# Patient Record
Sex: Female | Born: 1940 | Race: White | Hispanic: No | Marital: Married | Smoking: Never smoker
Health system: Southern US, Community
[De-identification: ages and names within clinical notes are randomized; demographics above are authoritative.]

## PROBLEM LIST (undated history)

## (undated) DIAGNOSIS — R0602 Shortness of breath: Secondary | ICD-10-CM

## (undated) DIAGNOSIS — B059 Measles without complication: Secondary | ICD-10-CM

## (undated) DIAGNOSIS — I10 Essential (primary) hypertension: Principal | ICD-10-CM

## (undated) DIAGNOSIS — E119 Type 2 diabetes mellitus without complications: Secondary | ICD-10-CM

## (undated) DIAGNOSIS — Z8619 Personal history of other infectious and parasitic diseases: Secondary | ICD-10-CM

## (undated) DIAGNOSIS — G4730 Sleep apnea, unspecified: Secondary | ICD-10-CM

## (undated) DIAGNOSIS — E039 Hypothyroidism, unspecified: Secondary | ICD-10-CM

## (undated) DIAGNOSIS — L659 Nonscarring hair loss, unspecified: Secondary | ICD-10-CM

## (undated) DIAGNOSIS — R51 Headache: Secondary | ICD-10-CM

## (undated) DIAGNOSIS — T8859XA Other complications of anesthesia, initial encounter: Secondary | ICD-10-CM

## (undated) DIAGNOSIS — C801 Malignant (primary) neoplasm, unspecified: Secondary | ICD-10-CM

## (undated) DIAGNOSIS — M1990 Unspecified osteoarthritis, unspecified site: Secondary | ICD-10-CM

## (undated) DIAGNOSIS — T4145XA Adverse effect of unspecified anesthetic, initial encounter: Secondary | ICD-10-CM

## (undated) DIAGNOSIS — R0601 Orthopnea: Secondary | ICD-10-CM

## (undated) DIAGNOSIS — I2699 Other pulmonary embolism without acute cor pulmonale: Secondary | ICD-10-CM

## (undated) HISTORY — DX: Unspecified osteoarthritis, unspecified site: M19.90

## (undated) HISTORY — DX: Essential (primary) hypertension: I10

## (undated) HISTORY — DX: Type 2 diabetes mellitus without complications: E11.9

## (undated) HISTORY — DX: Hypothyroidism, unspecified: E03.9

## (undated) HISTORY — DX: Malignant (primary) neoplasm, unspecified: C80.1

## (undated) HISTORY — DX: Nonscarring hair loss, unspecified: L65.9

---

## 2000-09-06 HISTORY — PX: THYROIDECTOMY, PARTIAL: SHX18

## 2000-10-12 ENCOUNTER — Ambulatory Visit (HOSPITAL_COMMUNITY): Admission: RE | Admit: 2000-10-12 | Discharge: 2000-10-12 | Payer: Self-pay | Admitting: Orthopedic Surgery

## 2000-10-20 ENCOUNTER — Encounter: Payer: Self-pay | Admitting: Orthopedic Surgery

## 2000-10-20 ENCOUNTER — Ambulatory Visit: Admission: RE | Admit: 2000-10-20 | Discharge: 2000-10-20 | Payer: Self-pay | Admitting: Orthopedic Surgery

## 2001-02-21 ENCOUNTER — Encounter: Admission: RE | Admit: 2001-02-21 | Discharge: 2001-02-21 | Payer: Self-pay | Admitting: Family Medicine

## 2001-02-21 ENCOUNTER — Encounter: Payer: Self-pay | Admitting: Family Medicine

## 2001-03-07 ENCOUNTER — Ambulatory Visit (HOSPITAL_COMMUNITY): Admission: RE | Admit: 2001-03-07 | Discharge: 2001-03-07 | Payer: Self-pay | Admitting: Family Medicine

## 2001-03-07 ENCOUNTER — Encounter (INDEPENDENT_AMBULATORY_CARE_PROVIDER_SITE_OTHER): Payer: Self-pay

## 2001-03-07 ENCOUNTER — Encounter: Payer: Self-pay | Admitting: Family Medicine

## 2001-05-24 ENCOUNTER — Encounter: Payer: Self-pay | Admitting: Anesthesiology

## 2001-05-24 ENCOUNTER — Inpatient Hospital Stay (HOSPITAL_COMMUNITY): Admission: RE | Admit: 2001-05-24 | Discharge: 2001-05-26 | Payer: Self-pay | Admitting: General Surgery

## 2001-05-24 ENCOUNTER — Encounter (INDEPENDENT_AMBULATORY_CARE_PROVIDER_SITE_OTHER): Payer: Self-pay

## 2001-05-25 ENCOUNTER — Encounter: Payer: Self-pay | Admitting: General Surgery

## 2001-05-26 ENCOUNTER — Encounter: Payer: Self-pay | Admitting: General Surgery

## 2001-06-21 ENCOUNTER — Encounter: Payer: Self-pay | Admitting: Obstetrics and Gynecology

## 2001-06-21 ENCOUNTER — Ambulatory Visit (HOSPITAL_COMMUNITY): Admission: RE | Admit: 2001-06-21 | Discharge: 2001-06-21 | Payer: Self-pay | Admitting: Obstetrics and Gynecology

## 2001-07-12 ENCOUNTER — Ambulatory Visit: Admission: RE | Admit: 2001-07-12 | Discharge: 2001-07-12 | Payer: Self-pay | Admitting: Gynecology

## 2001-07-12 ENCOUNTER — Encounter (INDEPENDENT_AMBULATORY_CARE_PROVIDER_SITE_OTHER): Payer: Self-pay | Admitting: Specialist

## 2001-07-12 ENCOUNTER — Other Ambulatory Visit: Admission: RE | Admit: 2001-07-12 | Discharge: 2001-07-12 | Payer: Self-pay | Admitting: Gynecology

## 2001-09-29 ENCOUNTER — Encounter: Payer: Self-pay | Admitting: Family Medicine

## 2001-09-30 ENCOUNTER — Inpatient Hospital Stay (HOSPITAL_COMMUNITY): Admission: EM | Admit: 2001-09-30 | Discharge: 2001-10-05 | Payer: Self-pay | Admitting: Internal Medicine

## 2001-10-02 ENCOUNTER — Encounter: Payer: Self-pay | Admitting: Internal Medicine

## 2002-03-28 ENCOUNTER — Encounter: Admission: RE | Admit: 2002-03-28 | Discharge: 2002-06-04 | Payer: Self-pay | Admitting: Family Medicine

## 2005-09-06 HISTORY — PX: BREAST SURGERY: SHX581

## 2006-05-05 ENCOUNTER — Encounter (INDEPENDENT_AMBULATORY_CARE_PROVIDER_SITE_OTHER): Payer: Self-pay | Admitting: Radiology

## 2006-05-05 ENCOUNTER — Encounter (INDEPENDENT_AMBULATORY_CARE_PROVIDER_SITE_OTHER): Payer: Self-pay | Admitting: *Deleted

## 2006-05-05 ENCOUNTER — Encounter: Admission: RE | Admit: 2006-05-05 | Discharge: 2006-05-05 | Payer: Self-pay | Admitting: Obstetrics and Gynecology

## 2006-05-18 ENCOUNTER — Encounter: Admission: RE | Admit: 2006-05-18 | Discharge: 2006-05-18 | Payer: Self-pay | Admitting: General Surgery

## 2006-05-19 ENCOUNTER — Encounter: Admission: RE | Admit: 2006-05-19 | Discharge: 2006-05-19 | Payer: Self-pay | Admitting: General Surgery

## 2006-05-24 ENCOUNTER — Encounter (INDEPENDENT_AMBULATORY_CARE_PROVIDER_SITE_OTHER): Payer: Self-pay | Admitting: Specialist

## 2006-05-24 ENCOUNTER — Encounter: Admission: RE | Admit: 2006-05-24 | Discharge: 2006-05-24 | Payer: Self-pay | Admitting: General Surgery

## 2006-05-24 ENCOUNTER — Observation Stay (HOSPITAL_COMMUNITY): Admission: AD | Admit: 2006-05-24 | Discharge: 2006-05-25 | Payer: Self-pay | Admitting: General Surgery

## 2006-06-07 ENCOUNTER — Ambulatory Visit: Payer: Self-pay | Admitting: Oncology

## 2006-07-01 ENCOUNTER — Ambulatory Visit: Admission: RE | Admit: 2006-07-01 | Discharge: 2006-09-29 | Payer: Self-pay | Admitting: Radiation Oncology

## 2006-08-09 ENCOUNTER — Encounter: Admission: RE | Admit: 2006-08-09 | Discharge: 2006-08-09 | Payer: Self-pay | Admitting: Oncology

## 2006-08-11 ENCOUNTER — Ambulatory Visit: Payer: Self-pay | Admitting: Oncology

## 2006-10-13 ENCOUNTER — Ambulatory Visit: Payer: Self-pay | Admitting: Oncology

## 2006-10-18 LAB — COMPREHENSIVE METABOLIC PANEL
ALT: 15 U/L (ref 0–35)
Albumin: 3.9 g/dL (ref 3.5–5.2)
BUN: 18 mg/dL (ref 6–23)
CO2: 26 mEq/L (ref 19–32)
Calcium: 9.4 mg/dL (ref 8.4–10.5)
Chloride: 103 mEq/L (ref 96–112)
Creatinine, Ser: 0.8 mg/dL (ref 0.40–1.20)
Potassium: 3.8 mEq/L (ref 3.5–5.3)

## 2006-10-18 LAB — CBC WITH DIFFERENTIAL/PLATELET
BASO%: 0.8 % (ref 0.0–2.0)
Basophils Absolute: 0 10*3/uL (ref 0.0–0.1)
HCT: 37.2 % (ref 34.8–46.6)
HGB: 13.1 g/dL (ref 11.6–15.9)
MONO#: 0.2 10*3/uL (ref 0.1–0.9)
NEUT%: 67.2 % (ref 39.6–76.8)
RDW: 14.8 % — ABNORMAL HIGH (ref 11.3–14.5)
WBC: 3.7 10*3/uL — ABNORMAL LOW (ref 3.9–10.0)
lymph#: 0.9 10*3/uL (ref 0.9–3.3)

## 2006-10-18 LAB — CANCER ANTIGEN 27.29: CA 27.29: 41 U/mL — ABNORMAL HIGH (ref 0–39)

## 2006-10-31 LAB — ESTRADIOL, ULTRA SENS: Estradiol, Ultra Sensitive: <2 pg/mL

## 2007-01-20 ENCOUNTER — Ambulatory Visit: Payer: Self-pay | Admitting: Oncology

## 2007-01-24 LAB — CBC WITH DIFFERENTIAL/PLATELET
BASO%: 0.4 % (ref 0.0–2.0)
Eosinophils Absolute: 0.1 10*3/uL (ref 0.0–0.5)
MCHC: 35.5 g/dL (ref 32.0–36.0)
MONO#: 0.3 10*3/uL (ref 0.1–0.9)
NEUT#: 2.3 10*3/uL (ref 1.5–6.5)
RBC: 4.08 10*6/uL (ref 3.70–5.32)
WBC: 3.7 10*3/uL — ABNORMAL LOW (ref 3.9–10.0)
lymph#: 1 10*3/uL (ref 0.9–3.3)

## 2007-01-24 LAB — COMPREHENSIVE METABOLIC PANEL
ALT: 22 U/L (ref 0–35)
Albumin: 3.4 g/dL — ABNORMAL LOW (ref 3.5–5.2)
CO2: 25 mEq/L (ref 19–32)
Calcium: 9.4 mg/dL (ref 8.4–10.5)
Chloride: 106 mEq/L (ref 96–112)
Glucose, Bld: 154 mg/dL — ABNORMAL HIGH (ref 70–99)
Potassium: 3.9 mEq/L (ref 3.5–5.3)
Sodium: 140 mEq/L (ref 135–145)
Total Bilirubin: 0.7 mg/dL (ref 0.3–1.2)
Total Protein: 7.1 g/dL (ref 6.0–8.3)

## 2007-04-24 ENCOUNTER — Encounter: Admission: RE | Admit: 2007-04-24 | Discharge: 2007-04-24 | Payer: Self-pay | Admitting: Oncology

## 2007-05-02 ENCOUNTER — Ambulatory Visit: Payer: Self-pay | Admitting: Oncology

## 2007-05-04 LAB — CBC WITH DIFFERENTIAL/PLATELET
BASO%: 0.5 % (ref 0.0–2.0)
LYMPH%: 33.7 % (ref 14.0–48.0)
MCHC: 34.7 g/dL (ref 32.0–36.0)
MONO#: 0.5 10*3/uL (ref 0.1–0.9)
NEUT#: 2.1 10*3/uL (ref 1.5–6.5)
Platelets: 167 10*3/uL (ref 145–400)
RBC: 4.25 10*6/uL (ref 3.70–5.32)
RDW: 14 % (ref 11.3–14.5)
WBC: 4.1 10*3/uL (ref 3.9–10.0)

## 2007-05-04 LAB — COMPREHENSIVE METABOLIC PANEL
ALT: 16 U/L (ref 0–35)
Albumin: 3.9 g/dL (ref 3.5–5.2)
Alkaline Phosphatase: 72 U/L (ref 39–117)
CO2: 22 mEq/L (ref 19–32)
Potassium: 4.1 mEq/L (ref 3.5–5.3)
Sodium: 139 mEq/L (ref 135–145)
Total Bilirubin: 0.5 mg/dL (ref 0.3–1.2)
Total Protein: 6.9 g/dL (ref 6.0–8.3)

## 2007-07-14 ENCOUNTER — Ambulatory Visit: Payer: Self-pay | Admitting: Oncology

## 2007-07-18 LAB — CBC WITH DIFFERENTIAL/PLATELET
BASO%: 0.7 % (ref 0.0–2.0)
Basophils Absolute: 0 10*3/uL (ref 0.0–0.1)
HCT: 37.5 % (ref 34.8–46.6)
HGB: 13 g/dL (ref 11.6–15.9)
LYMPH%: 30.7 % (ref 14.0–48.0)
MCH: 31.8 pg (ref 26.0–34.0)
MCHC: 34.7 g/dL (ref 32.0–36.0)
MONO#: 0.3 10*3/uL (ref 0.1–0.9)
NEUT%: 56.2 % (ref 39.6–76.8)
Platelets: 190 10*3/uL (ref 145–400)
WBC: 3.6 10*3/uL — ABNORMAL LOW (ref 3.9–10.0)

## 2007-07-18 LAB — COMPREHENSIVE METABOLIC PANEL
ALT: 18 U/L (ref 0–35)
BUN: 20 mg/dL (ref 6–23)
CO2: 25 mEq/L (ref 19–32)
Calcium: 9.5 mg/dL (ref 8.4–10.5)
Creatinine, Ser: 0.8 mg/dL (ref 0.40–1.20)
Glucose, Bld: 81 mg/dL (ref 70–99)
Total Bilirubin: 0.6 mg/dL (ref 0.3–1.2)

## 2007-07-18 LAB — CANCER ANTIGEN 27.29: CA 27.29: 39 U/mL (ref 0–39)

## 2007-09-07 HISTORY — PX: KNEE SURGERY: SHX244

## 2007-11-30 ENCOUNTER — Inpatient Hospital Stay (HOSPITAL_BASED_OUTPATIENT_CLINIC_OR_DEPARTMENT_OTHER): Admission: RE | Admit: 2007-11-30 | Discharge: 2007-11-30 | Payer: Self-pay | Admitting: *Deleted

## 2008-01-18 ENCOUNTER — Ambulatory Visit: Payer: Self-pay | Admitting: Oncology

## 2008-01-19 LAB — CBC WITH DIFFERENTIAL/PLATELET
Basophils Absolute: 0 10*3/uL (ref 0.0–0.1)
Eosinophils Absolute: 0.1 10*3/uL (ref 0.0–0.5)
HCT: 38.4 % (ref 34.8–46.6)
LYMPH%: 28.3 % (ref 14.0–48.0)
MCHC: 34.3 g/dL (ref 32.0–36.0)
MONO#: 0.3 10*3/uL (ref 0.1–0.9)
NEUT#: 2.4 10*3/uL (ref 1.5–6.5)
NEUT%: 59.9 % (ref 39.6–76.8)
Platelets: 215 10*3/uL (ref 145–400)
WBC: 4 10*3/uL (ref 3.9–10.0)

## 2008-01-19 LAB — CANCER ANTIGEN 27.29: CA 27.29: 38 U/mL (ref 0–39)

## 2008-01-19 LAB — COMPREHENSIVE METABOLIC PANEL
BUN: 21 mg/dL (ref 6–23)
CO2: 25 mEq/L (ref 19–32)
Creatinine, Ser: 0.74 mg/dL (ref 0.40–1.20)
Glucose, Bld: 92 mg/dL (ref 70–99)
Total Bilirubin: 0.5 mg/dL (ref 0.3–1.2)

## 2008-06-17 ENCOUNTER — Inpatient Hospital Stay (HOSPITAL_COMMUNITY): Admission: RE | Admit: 2008-06-17 | Discharge: 2008-06-21 | Payer: Self-pay | Admitting: Orthopedic Surgery

## 2008-07-22 ENCOUNTER — Ambulatory Visit: Payer: Self-pay | Admitting: Oncology

## 2008-07-24 LAB — CBC WITH DIFFERENTIAL/PLATELET
Basophils Absolute: 0 10*3/uL (ref 0.0–0.1)
Eosinophils Absolute: 0.1 10*3/uL (ref 0.0–0.5)
HGB: 10.7 g/dL — ABNORMAL LOW (ref 11.6–15.9)
LYMPH%: 21.2 % (ref 14.0–48.0)
MCV: 92.9 fL (ref 81.0–101.0)
MONO#: 0.4 10*3/uL (ref 0.1–0.9)
MONO%: 10.8 % (ref 0.0–13.0)
NEUT#: 2.4 10*3/uL (ref 1.5–6.5)
Platelets: 255 10*3/uL (ref 145–400)
WBC: 3.7 10*3/uL — ABNORMAL LOW (ref 3.9–10.0)

## 2008-07-24 LAB — COMPREHENSIVE METABOLIC PANEL
Albumin: 3.1 g/dL — ABNORMAL LOW (ref 3.5–5.2)
Alkaline Phosphatase: 103 U/L (ref 39–117)
BUN: 11 mg/dL (ref 6–23)
CO2: 28 mEq/L (ref 19–32)
Glucose, Bld: 97 mg/dL (ref 70–99)
Potassium: 3.3 mEq/L — ABNORMAL LOW (ref 3.5–5.3)
Sodium: 141 mEq/L (ref 135–145)
Total Bilirubin: 0.7 mg/dL (ref 0.3–1.2)
Total Protein: 6.7 g/dL (ref 6.0–8.3)

## 2008-07-25 LAB — CANCER ANTIGEN 27.29: CA 27.29: 48 U/mL — ABNORMAL HIGH (ref 0–39)

## 2008-08-07 ENCOUNTER — Encounter (INDEPENDENT_AMBULATORY_CARE_PROVIDER_SITE_OTHER): Payer: Self-pay | Admitting: Emergency Medicine

## 2008-08-07 ENCOUNTER — Emergency Department (HOSPITAL_COMMUNITY): Admission: EM | Admit: 2008-08-07 | Discharge: 2008-08-07 | Payer: Self-pay | Admitting: Emergency Medicine

## 2008-08-07 ENCOUNTER — Ambulatory Visit: Payer: Self-pay | Admitting: Surgery

## 2008-09-17 ENCOUNTER — Encounter: Admission: RE | Admit: 2008-09-17 | Discharge: 2008-09-17 | Payer: Self-pay | Admitting: Oncology

## 2008-12-11 ENCOUNTER — Ambulatory Visit: Payer: Self-pay | Admitting: Family Medicine

## 2008-12-11 DIAGNOSIS — M1990 Unspecified osteoarthritis, unspecified site: Secondary | ICD-10-CM

## 2008-12-11 DIAGNOSIS — L659 Nonscarring hair loss, unspecified: Secondary | ICD-10-CM

## 2008-12-11 DIAGNOSIS — I10 Essential (primary) hypertension: Secondary | ICD-10-CM | POA: Insufficient documentation

## 2008-12-11 DIAGNOSIS — E039 Hypothyroidism, unspecified: Secondary | ICD-10-CM | POA: Insufficient documentation

## 2008-12-11 DIAGNOSIS — E119 Type 2 diabetes mellitus without complications: Secondary | ICD-10-CM

## 2008-12-11 HISTORY — DX: Nonscarring hair loss, unspecified: L65.9

## 2008-12-11 HISTORY — DX: Type 2 diabetes mellitus without complications: E11.9

## 2008-12-11 HISTORY — DX: Unspecified osteoarthritis, unspecified site: M19.90

## 2008-12-11 HISTORY — DX: Essential (primary) hypertension: I10

## 2008-12-11 HISTORY — DX: Hypothyroidism, unspecified: E03.9

## 2008-12-13 LAB — CONVERTED CEMR LAB
Calcium: 9.6 mg/dL (ref 8.4–10.5)
GFR calc non Af Amer: 75.64 mL/min (ref >60)
Hgb A1c MFr Bld: 6 % (ref 4.6–6.5)
Sodium: 142 meq/L (ref 135–145)

## 2009-01-01 ENCOUNTER — Ambulatory Visit: Payer: Self-pay | Admitting: Family Medicine

## 2009-01-10 ENCOUNTER — Ambulatory Visit: Payer: Self-pay | Admitting: Oncology

## 2009-01-14 LAB — CBC WITH DIFFERENTIAL/PLATELET
BASO%: 0.6 % (ref 0.0–2.0)
Basophils Absolute: 0 10*3/uL (ref 0.0–0.1)
EOS%: 2.8 % (ref 0.0–7.0)
HGB: 12.7 g/dL (ref 11.6–15.9)
MCH: 31.9 pg (ref 25.1–34.0)
MCV: 92.6 fL (ref 79.5–101.0)
MONO%: 6.2 % (ref 0.0–14.0)
RBC: 3.97 10*6/uL (ref 3.70–5.45)
RDW: 15.6 % — ABNORMAL HIGH (ref 11.2–14.5)
lymph#: 1.4 10*3/uL (ref 0.9–3.3)

## 2009-01-14 LAB — COMPREHENSIVE METABOLIC PANEL
AST: 27 U/L (ref 0–37)
Alkaline Phosphatase: 80 U/L (ref 39–117)
BUN: 24 mg/dL — ABNORMAL HIGH (ref 6–23)
Creatinine, Ser: 0.84 mg/dL (ref 0.40–1.20)
Glucose, Bld: 145 mg/dL — ABNORMAL HIGH (ref 70–99)
Total Bilirubin: 0.4 mg/dL (ref 0.3–1.2)

## 2009-03-03 ENCOUNTER — Ambulatory Visit: Payer: Self-pay | Admitting: Family Medicine

## 2009-04-02 ENCOUNTER — Ambulatory Visit: Payer: Self-pay | Admitting: Family Medicine

## 2009-04-09 ENCOUNTER — Telehealth (INDEPENDENT_AMBULATORY_CARE_PROVIDER_SITE_OTHER): Payer: Self-pay | Admitting: *Deleted

## 2009-05-22 ENCOUNTER — Telehealth: Payer: Self-pay | Admitting: Family Medicine

## 2009-07-04 ENCOUNTER — Ambulatory Visit: Payer: Self-pay | Admitting: Family Medicine

## 2009-07-08 LAB — CONVERTED CEMR LAB: Hgb A1c MFr Bld: 6 % (ref 4.6–6.5)

## 2009-07-15 ENCOUNTER — Ambulatory Visit: Payer: Self-pay | Admitting: Oncology

## 2009-07-17 LAB — CBC WITH DIFFERENTIAL/PLATELET
Basophils Absolute: 0 10*3/uL (ref 0.0–0.1)
EOS%: 4.5 % (ref 0.0–7.0)
Eosinophils Absolute: 0.2 10*3/uL (ref 0.0–0.5)
HGB: 12.1 g/dL (ref 11.6–15.9)
NEUT#: 1.9 10*3/uL (ref 1.5–6.5)
RBC: 3.73 10*6/uL (ref 3.70–5.45)
RDW: 14.3 % (ref 11.2–14.5)
lymph#: 1.2 10*3/uL (ref 0.9–3.3)

## 2009-07-17 LAB — COMPREHENSIVE METABOLIC PANEL
AST: 21 U/L (ref 0–37)
Albumin: 3.8 g/dL (ref 3.5–5.2)
Alkaline Phosphatase: 78 U/L (ref 39–117)
BUN: 23 mg/dL (ref 6–23)
Calcium: 9.2 mg/dL (ref 8.4–10.5)
Chloride: 106 mEq/L (ref 96–112)
Glucose, Bld: 94 mg/dL (ref 70–99)
Potassium: 4.2 mEq/L (ref 3.5–5.3)
Sodium: 141 mEq/L (ref 135–145)
Total Protein: 6.8 g/dL (ref 6.0–8.3)

## 2009-07-24 ENCOUNTER — Encounter: Payer: Self-pay | Admitting: Family Medicine

## 2009-08-05 ENCOUNTER — Ambulatory Visit: Payer: Self-pay | Admitting: Family Medicine

## 2009-11-05 ENCOUNTER — Ambulatory Visit: Payer: Self-pay | Admitting: Family Medicine

## 2009-11-05 LAB — CONVERTED CEMR LAB
Bilirubin Urine: NEGATIVE
Creatinine,U: 210.8 mg/dL
Glucose, Urine, Semiquant: NEGATIVE
Microalb Creat Ratio: 20.4 mg/g (ref 0.0–30.0)
Nitrite: NEGATIVE
Urobilinogen, UA: 0.2

## 2009-11-07 LAB — CONVERTED CEMR LAB
Hgb A1c MFr Bld: 6 % (ref 4.6–6.5)
TSH: 1.03 microintl units/mL (ref 0.35–5.50)

## 2009-12-23 ENCOUNTER — Encounter: Payer: Self-pay | Admitting: Family Medicine

## 2010-01-05 ENCOUNTER — Encounter: Admission: RE | Admit: 2010-01-05 | Discharge: 2010-01-05 | Payer: Self-pay | Admitting: Obstetrics and Gynecology

## 2010-01-07 ENCOUNTER — Ambulatory Visit: Payer: Self-pay | Admitting: Family Medicine

## 2010-01-12 ENCOUNTER — Ambulatory Visit: Payer: Self-pay | Admitting: Family Medicine

## 2010-01-14 ENCOUNTER — Ambulatory Visit: Payer: Self-pay | Admitting: Family Medicine

## 2010-04-08 ENCOUNTER — Ambulatory Visit: Payer: Self-pay | Admitting: Family Medicine

## 2010-04-09 LAB — CONVERTED CEMR LAB: Hgb A1c MFr Bld: 6.1 % (ref 4.6–6.5)

## 2010-05-03 LAB — HM DIABETES EYE EXAM: HM Diabetic Eye Exam: NORMAL

## 2010-05-06 ENCOUNTER — Ambulatory Visit: Payer: Self-pay | Admitting: Family Medicine

## 2010-06-22 ENCOUNTER — Encounter: Payer: Self-pay | Admitting: Family Medicine

## 2010-07-10 ENCOUNTER — Ambulatory Visit (HOSPITAL_COMMUNITY): Admission: RE | Admit: 2010-07-10 | Discharge: 2010-07-10 | Payer: Self-pay | Admitting: Orthopedic Surgery

## 2010-07-15 ENCOUNTER — Encounter: Payer: Self-pay | Admitting: Family Medicine

## 2010-07-22 ENCOUNTER — Ambulatory Visit: Payer: Self-pay | Admitting: Oncology

## 2010-07-24 ENCOUNTER — Encounter: Payer: Self-pay | Admitting: Family Medicine

## 2010-07-24 LAB — COMPREHENSIVE METABOLIC PANEL
AST: 25 U/L (ref 0–37)
CO2: 30 mEq/L (ref 19–32)
Chloride: 103 mEq/L (ref 96–112)
Glucose, Bld: 95 mg/dL (ref 70–99)
Sodium: 140 mEq/L (ref 135–145)

## 2010-07-24 LAB — CBC WITH DIFFERENTIAL/PLATELET
BASO%: 0.5 % (ref 0.0–2.0)
EOS%: 4.4 % (ref 0.0–7.0)
Eosinophils Absolute: 0.2 10*3/uL (ref 0.0–0.5)
HCT: 37.2 % (ref 34.8–46.6)
HGB: 12.7 g/dL (ref 11.6–15.9)
NEUT#: 2 10*3/uL (ref 1.5–6.5)
RBC: 3.89 10*6/uL (ref 3.70–5.45)
RDW: 15.2 % — ABNORMAL HIGH (ref 11.2–14.5)

## 2010-07-28 ENCOUNTER — Telehealth: Payer: Self-pay | Admitting: Family Medicine

## 2010-08-03 LAB — HM DIABETES FOOT EXAM

## 2010-08-05 ENCOUNTER — Ambulatory Visit: Payer: Self-pay | Admitting: Family Medicine

## 2010-09-26 ENCOUNTER — Other Ambulatory Visit: Payer: Self-pay | Admitting: Oncology

## 2010-09-26 ENCOUNTER — Encounter: Payer: Self-pay | Admitting: Orthopedic Surgery

## 2010-09-26 DIAGNOSIS — Z9889 Other specified postprocedural states: Secondary | ICD-10-CM

## 2010-09-26 DIAGNOSIS — Z0000 Encounter for general adult medical examination without abnormal findings: Secondary | ICD-10-CM

## 2010-09-27 ENCOUNTER — Encounter: Payer: Self-pay | Admitting: Oncology

## 2010-09-27 ENCOUNTER — Encounter: Payer: Self-pay | Admitting: Obstetrics and Gynecology

## 2010-10-06 NOTE — Letter (Signed)
Summary: [facility]  [facility]   Imported By: Maryln Gottron 07/23/2010 10:27:36  _____________________________________________________________________  External Attachment:    Type:   Image     Comment:   External Document

## 2010-10-06 NOTE — Assessment & Plan Note (Signed)
Summary: 1 month ov//ccm/Resched/cb   Vital Signs:  Patient profile:   70 year old female Menstrual status:  postmenopausal Weight:      310 pounds Temp:     98.3 degrees F oral BP sitting:   148 / 78  (left arm) Cuff size:   large  Vitals Entered By: Sid Falcon LPN (May 06, 2010 1:15 PM)  Serial Vital Signs/Assessments:  Time      Position  BP       Pulse  Resp  Temp     By                     130/80                         Evelena Peat MD  CC: one month follow-up, Hypertension Management  Vision Screening:      Vision Comments: 05/2011   History of Present Illness: Patient seen for followup. Recent elevated blood pressure. Started carvedilol 3.125 mg twice daily. Patient had some nonspecific symptoms of intermittent headache and dyspepsia and attributed this to medication. She's only been taking this once daily. She remains on lisinopril. Not monitoring blood pressure.  Type 2 diabetes with recent A1c 6.1%. Blood sugars have been stable. Knee is not much improved with recent corticosteroid injection.  Achy pain and stiffness R knee > L.  No recent injury.  Diabetes Management History:      She has not been enrolled in the "Diabetic Education Program".  She states understanding of dietary principles but she is not following the appropriate diet.  No sensory loss is reported.  Self foot exams are not being performed.  She is checking home blood sugars.  She says that she is not exercising regularly.        Hypoglycemic symptoms are not occurring.  No hyperglycemic symptoms are reported.        No changes have been made to her treatment plan since last visit.    Hypertension History:      She complains of dyspnea with exertion, but denies headache, chest pain, palpitations, orthopnea, peripheral edema, visual symptoms, neurologic problems, and syncope.        Positive major cardiovascular risk factors include female age 36 years old or older, diabetes, and  hypertension.  Negative major cardiovascular risk factors include non-tobacco-user status.     Allergies (verified): 1)  Mobic (Meloxicam)  Past History:  Past Medical History: Last updated: 04/02/2009 Arthritis Breast Cancer Chicken pox Type 2 diabetes Hypertension Blood clots (pulmonary emboli) thyroid problems (hypothyroid) Osteoarthritis PMH reviewed for relevance  Review of Systems       The patient complains of dyspnea on exertion.  The patient denies anorexia, fever, vision loss, chest pain, syncope, peripheral edema, prolonged cough, headaches, abdominal pain, muscle weakness, and depression.         has lost about 5 pounds due to her efforts  Physical Exam  General:  patient is alert pleasant obese in no distress Eyes:  pupils equal, pupils round, and pupils reactive to light.   Mouth:  Oral mucosa and oropharynx without lesions or exudates.  Teeth in good repair. Neck:  No deformities, masses, or tenderness noted. Lungs:  Normal respiratory effort, chest expands symmetrically. Lungs are clear to auscultation, no crackles or wheezes. Heart:  Normal rate and regular rhythm. S1 and S2 normal without gallop, murmur, click, rub or other extra sounds. Extremities:  no pitting edema.  Neurologic:  alert & oriented X3, cranial nerves II-XII intact, and gait normal.    Diabetes Management Exam:    Eye Exam:       Eye Exam done here today          Results: normal   Impression & Recommendations:  Problem # 1:  HYPERTENSION (ICD-401.9) Assessment Improved get back on carvedilol 3.125 mg b.i.d. Followup 3 months or Her updated medication list for this problem includes:    Lisinopril 40 Mg Tabs (Lisinopril) ..... Once daily    Carvedilol 3.125 Mg Tabs (Carvedilol) ..... One by mouth bid  Problem # 2:  AODM (ICD-250.00) Assessment: Unchanged  Her updated medication list for this problem includes:    Avandamet 10-998 Mg Tabs (Rosiglitazone-metformin) ..... One tab two  times a day    Lisinopril 40 Mg Tabs (Lisinopril) ..... Once daily    Adult Aspirin Ec Low Strength 81 Mg Tbec (Aspirin) ..... Once daily  Problem # 3:  OSTEOARTHRITIS (ICD-715.90) cont weight loss efforts. Her updated medication list for this problem includes:    Celebrex 200 Mg Caps (Celecoxib) ..... Once daily    Adult Aspirin Ec Low Strength 81 Mg Tbec (Aspirin) ..... Once daily    Advil 200 Mg Caps (Ibuprofen) .Marland Kitchen... As needed  Complete Medication List: 1)  Avandamet 10-998 Mg Tabs (Rosiglitazone-metformin) .... One tab two times a day 2)  Arimidex 1 Mg Tabs (Anastrozole) .... Once daily 3)  Celebrex 200 Mg Caps (Celecoxib) .... Once daily 4)  Lisinopril 40 Mg Tabs (Lisinopril) .... Once daily 5)  Synthroid 125 Mcg Tabs (Levothyroxine sodium) .... Once daily 6)  Adult Aspirin Ec Low Strength 81 Mg Tbec (Aspirin) .... Once daily 7)  Advil 200 Mg Caps (Ibuprofen) .... As needed 8)  Carvedilol 3.125 Mg Tabs (Carvedilol) .... One by mouth bid 9)  Hard Nails 2.5 Mg Caps (Biotin) .... Once daily  Hypertension Assessment/Plan:      The patient's hypertensive risk group is category C: Target organ damage and/or diabetes.  Her calculated 10 year risk of coronary heart disease is 17 %.  Today's blood pressure is 148/78.    Patient Instructions: 1)  Please schedule a follow-up appointment in 3 months .  2)  You need to lose weight. Consider a lower calorie diet and regular exercise.  3)  Check your  Blood Pressure regularly . If it is above:140/90   you should make an appointment.

## 2010-10-06 NOTE — Letter (Signed)
Summary: [facility]  [facility]   Imported By: Maryln Gottron 09/15/2009 10:42:12  _____________________________________________________________________  External Attachment:    Type:   Image     Comment:   External Document

## 2010-10-06 NOTE — Letter (Signed)
Summary: [facility]   Imported By: Maryln Gottron 07/23/2010 10:25:59  _____________________________________________________________________  External Attachment:    Type:   Image     Comment:   External Document

## 2010-10-06 NOTE — Progress Notes (Signed)
Summary: [facility] re[facility] Avandiment. No longer available  Phone Note From Pharm[facility] Call [facility] [phone]    [facility]  [id number]  - [place] Summary of Call: Pharmacy called and said that the Avandiment in no longer available. Has been pulled from the market. Pharmacy needs to know what med the Dr would like pt to take instead.  Initial call taken by: Lucy Antigua,  July 28, 2010 10:07 AM  Follow-up for Phone Call        I recommend she go on Metformin 1,000 mg two times a day and will need follow up within 3 months to reassess A1C. Follow-up by: Evelena Peat MD,  July 28, 2010 12:44 PM  Additional Follow-up for Phone Call Additional follow up Details #1::        Pt informed, she has OV next week Additional Follow-up by: Sid Falcon LPN,  July 28, 2010 1:38 PM    New/Updated Medications: METFORMIN HCL 1000 MG TABS (METFORMIN HCL) one tab two times a day Prescriptions: METFORMIN HCL 1000 MG TABS (METFORMIN HCL) one tab two times a day  #60 x 3   Entered by:   Sid Falcon LPN   Authorized by:   Evelena Peat MD   Signed by:   Sid Falcon LPN on 24/40/1027   Method used:   [facility]  [place] [address] [facility]       [address]       [place], [place]  [address]       Ph: [phone] or [phone]       Fax: [phone]   RxID:   [id number]

## 2010-10-06 NOTE — Assessment & Plan Note (Signed)
Summary: 3 month fup//ccm   Vital Signs:  Patient profile:   70 year old female Menstrual status:  postmenopausal Weight:      309 pounds BMI:     56.72 Temp:     98.9 degrees F oral BP sitting:   158 / 72  (left arm) Cuff size:   large  Vitals Entered By: Sid Falcon LPN (November 06, 7827 1:18 PM)  Nutrition Counseling: Patient's BMI is greater than 25 and therefore counseled on weight management options. CC: 3 month follow-up, Hypertension Management   History of Present Illness: Patient her followup medical problems.  hypothyroidism treated with Synthroid. Needs repeat lab work. Compliant with medication. Has had some steady weight gain over several months.  Blood pressures have been suboptimally controlled. Prior edema with amlodipine. Reported leg cramps with hydrochlorothiazide. Currently takes lisinopril and carvedilol. By home blood pressure monitor 140s to 150 systolic.  Type 2 diabetes which has been well-controlled. Infrequently checks blood sugars at home. No urine frequency or thirst. Last A1c 6.0%. Needs repeat eye exam.  Diabetes Management History:      She has not been enrolled in the "Diabetic Education Program".  She states understanding of dietary principles but she is not following the appropriate diet.  No sensory loss is reported.  Self foot exams are not being performed.  She is checking home blood sugars.  She says that she is not exercising regularly.        Hypoglycemic symptoms are not occurring.  No hyperglycemic symptoms are reported.        No changes have been made to her treatment plan since last visit.    Hypertension History:      She complains of peripheral edema, but denies headache, chest pain, palpitations, dyspnea with exertion, orthopnea, neurologic problems, syncope, and side effects from treatment.        Positive major cardiovascular risk factors include female age 63 years old or older, diabetes, and hypertension.  Negative major  cardiovascular risk factors include non-tobacco-user status.     Preventive Screening-Counseling & Management  Caffeine-Diet-Exercise     Does Patient Exercise: no  Allergies (verified): No Known Drug Allergies  Past History:  Past Medical History: Last updated: 04/02/2009 Arthritis Breast Cancer Chicken pox Type 2 diabetes Hypertension Blood clots (pulmonary emboli) thyroid problems (hypothyroid) Osteoarthritis  Past Surgical History: Last updated: 12/11/2008 Removal 1/2 thyroid 2002 Blood Clots 2003 Breast Cancer 2007 Left knee replacement 2009  Family History: Last updated: 12/11/2008 Family History of Cardiovascular disorder Breast CA  Social History: Last updated: 12/11/2008 Retired Married Never Smoked Alcohol use-no PMH-FH-SH reviewed for relevance  Social History: Does Patient Exercise:  no  Review of Systems      See HPI  Physical Exam  General:  Well-developed,well-nourished,in no acute distress; alert,appropriate and cooperative throughout examination Eyes:  pupils equal, pupils round, and pupils reactive to light.   Ears:  External ear exam shows no significant lesions or deformities.  Otoscopic examination reveals clear canals, tympanic membranes are intact bilaterally without bulging, retraction, inflammation or discharge. Hearing is grossly normal bilaterally. Mouth:  Oral mucosa and oropharynx without lesions or exudates.  Teeth in good repair. Neck:  No deformities, masses, or tenderness noted. Lungs:  Normal respiratory effort, chest expands symmetrically. Lungs are clear to auscultation, no crackles or wheezes. Heart:  normal rate and regular rhythm.   Extremities:  trace edema lower legs bilaterally  Diabetes Management Exam:    Foot Exam (with socks and/or shoes  not present):       Sensory-Pinprick/Light touch:          Left medial foot (L-4): normal          Left dorsal foot (L-5): normal          Left lateral foot (S-1):  normal          Right medial foot (L-4): normal          Right dorsal foot (L-5): normal          Right lateral foot (S-1): normal       Sensory-Monofilament:          Left foot: normal          Right foot: normal       Inspection:          Left foot: normal          Right foot: normal       Nails:          Left foot: normal          Right foot: normal    Eye Exam:       Eye Exam done elsewhere          Date: 09/10/2009          Results: normal   Impression & Recommendations:  Problem # 1:  HYPOTHYROIDISM (ICD-244.9)  Her updated medication list for this problem includes:    Synthroid 125 Mcg Tabs (Levothyroxine sodium) ..... Once daily  Orders: Venipuncture (04540) TLB-TSH (Thyroid Stimulating Hormone) (84443-TSH)  Problem # 2:  HYPERTENSION (ICD-401.9) suboptimal control.cautiously add back low dose HCTZ. Her updated medication list for this problem includes:    Lisinopril 40 Mg Tabs (Lisinopril) ..... Once daily    Carvedilol 3.125 Mg Tabs (Carvedilol) ..... One by mouth bid    Hydrochlorothiazide 25 Mg Tabs (Hydrochlorothiazide) ..... One half tablet by mouth once daily  Problem # 3:  AODM (ICD-250.00) needs eye exam.  Repeat A1C. Her updated medication list for this problem includes:    Avandamet 10-998 Mg Tabs (Rosiglitazone-metformin) ..... One tab two times a day    Lisinopril 40 Mg Tabs (Lisinopril) ..... Once daily    Adult Aspirin Ec Low Strength 81 Mg Tbec (Aspirin) ..... Once daily  Orders: TLB-Microalbumin/Creat Ratio, Urine (82043-MALB) Venipuncture (98119) TLB-A1C / Hgb A1C (Glycohemoglobin) (83036-A1C)  Complete Medication List: 1)  Avandamet 10-998 Mg Tabs (Rosiglitazone-metformin) .... One tab two times a day 2)  Arimidex 1 Mg Tabs (Anastrozole) .... Once daily 3)  Celebrex 200 Mg Caps (Celecoxib) .... Once daily 4)  Lisinopril 40 Mg Tabs (Lisinopril) .... Once daily 5)  Synthroid 125 Mcg Tabs (Levothyroxine sodium) .... Once daily 6)  Adult  Aspirin Ec Low Strength 81 Mg Tbec (Aspirin) .... Once daily 7)  Advil 200 Mg Caps (Ibuprofen) .... As needed 8)  Carvedilol 3.125 Mg Tabs (Carvedilol) .... One by mouth bid 9)  Hydrochlorothiazide 25 Mg Tabs (Hydrochlorothiazide) .... One half tablet by mouth once daily  Hypertension Assessment/Plan:      The patient's hypertensive risk group is category C: Target organ damage and/or diabetes.  Her calculated 10 year risk of coronary heart disease is 17 %.  Today's blood pressure is 158/72.    Patient Instructions: 1)  Please schedule a follow-up appointment in 2 months.  2)  You need to lose weight. Consider a lower calorie diet and regular exercise.  3)  It is important that your diabetic A1c level is checked  every 3 months.  4)  See your eye doctor yearly to check for diabetic eye damage. 5)  Check your feet each night  for sore areas, calluses or signs of infection.  6)  Set up eye exam Dr Elisabeth Most in [place]. Prescriptions: HYDROCHLOROTHIAZIDE 25 MG TABS (HYDROCHLOROTHIAZIDE) one half tablet by mouth once daily  #15 x 11   Entered and Authorized by:   Evelena Peat MD   Signed by:   Evelena Peat MD on 11/05/2009   Method used:   [facility]        [facility]  [place] [address] [facility]       [address]       [place], [place]  [address]       Ph: [phone] or [phone]       Fax: [phone]   RxID:   [id number]    Laboratory Results   Urine Tests    Routine Urinalysis   Color: yellow Appearance: Clear Glucose: negative   (Normal Range: Negative) Bilirubin: negative   (Normal Range: Negative) Ketone: negative   (Normal Range: Negative) Spec. Gravity: >=1.030   (Normal Range: 1.003-1.035) Blood: negative   (Normal Range: Negative) pH: 5.0   (Normal Range: 5.0-8.0) Protein: trace   (Normal Range: Negative) Urobilinogen: 0.2   (Normal Range: 0-1) Nitrite: negative   (Normal Range: Negative) Leukocyte Esterace: trace   (Normal Range:  Negative)    Comments: Rita Ohara  November 05, 2009 4:53 PM

## 2010-10-06 NOTE — Letter (Signed)
Summary: [facility]   Imported By: Maryln Gottron 01/02/2010 11:01:31  _____________________________________________________________________  External Attachment:    Type:   Image     Comment:   External Document

## 2010-10-06 NOTE — Assessment & Plan Note (Signed)
Summary: 2 month fup//ccm   Vital Signs:  Patient profile:   70 year old female Menstrual status:  postmenopausal Weight:      316 pounds Temp:     98.5 degrees F oral BP sitting:   142 / 70  (left arm) Cuff size:   large  Vitals Entered By: Sid Falcon LPN (Jan 07, 9810 1:09 PM) CC: 2 month follow-up   History of Present Illness: Patient has history of hypothyroidism, type 2 diabetes, morbid obesity, osteoarthritis, and hypertension.  Seen today with complaint of right ankle pain progressive over several weeks. No injury. History degenerative arthritis. Prior left total knee replacement. Pain worse with ambulation. Moderate edema at times. Progressive weight gain. Celebrex without much improvement. No weakness or giving way.  Recent follow up with cardiologist. Nonobstructive CAD. No med changes made.  Type 2 diabetes. Not monitoring blood sugars regularly. No symptoms of hyper or hypoglycemia. A1c  reveals excellent control.  Obesity.  Pt poorly compliant with diet and no exercise.  Allergies (verified): No Known Drug Allergies  Past History:  Past Medical History: Last updated: 04/02/2009 Arthritis Breast Cancer Chicken pox Type 2 diabetes Hypertension Blood clots (pulmonary emboli) thyroid problems (hypothyroid) Osteoarthritis  Past Surgical History: Last updated: 12/11/2008 Removal 1/2 thyroid 2002 Blood Clots 2003 Breast Cancer 2007 Left knee replacement 2009  Social History: Last updated: 12/11/2008 Retired Married Never Smoked Alcohol use-no PMH-FH-SH reviewed for relevance  Review of Systems       The patient complains of weight gain and peripheral edema.  The patient denies anorexia, fever, weight loss, chest pain, syncope, prolonged cough, headaches, hemoptysis, abdominal pain, melena, hematochezia, and severe indigestion/heartburn.    Physical Exam  General:   pleasant morbidly obese 70 year old female Mouth:  Oral mucosa and oropharynx  without lesions or exudates.  Teeth in good repair. Neck:  No deformities, masses, or tenderness noted. Lungs:  Normal respiratory effort, chest expands symmetrically. Lungs are clear to auscultation, no crackles or wheezes. Heart:  normal rate, regular rhythm, and no murmur.   Extremities:  patient has trace to 1+ pitting edema lower legs bilaterally. Right ankle there is no warmth or any ecchymosis. She has some pain with extreme internal or external rotation. No Achilles tenderness. Neurologic:  full strength with plantar flexion dorsiflexion bilaterally   Impression & Recommendations:  Problem # 1:  OSTEOARTHRITIS (ICD-715.90)  suspected degenerative arthritis right ankle. Obtain x-rays. Consider corticosteroid injection Her updated medication list for this problem includes:    Celebrex 200 Mg Caps (Celecoxib) ..... Once daily    Adult Aspirin Ec Low Strength 81 Mg Tbec (Aspirin) ..... Once daily    Advil 200 Mg Caps (Ibuprofen) .Marland Kitchen... As needed  Orders: T-Ankle Comp Right (73610TC)  Problem # 2:  AODM (ICD-250.00)  Her updated medication list for this problem includes:    Avandamet 10-998 Mg Tabs (Rosiglitazone-metformin) ..... One tab two times a day    Lisinopril 40 Mg Tabs (Lisinopril) ..... Once daily    Adult Aspirin Ec Low Strength 81 Mg Tbec (Aspirin) ..... Once daily  Problem # 3:  MORBID OBESITY (ICD-278.01) I strongly recommend referral to nutritionist but patient refuses at this time. We talked at length about potential complications related to her obestiy  Complete Medication List: 1)  Avandamet 10-998 Mg Tabs (Rosiglitazone-metformin) .... One tab two times a day 2)  Arimidex 1 Mg Tabs (Anastrozole) .... Once daily 3)  Celebrex 200 Mg Caps (Celecoxib) .... Once daily 4)  Lisinopril 40  Mg Tabs (Lisinopril) .... Once daily 5)  Synthroid 125 Mcg Tabs (Levothyroxine sodium) .... Once daily 6)  Adult Aspirin Ec Low Strength 81 Mg Tbec (Aspirin) .... Once daily 7)   Advil 200 Mg Caps (Ibuprofen) .... As needed 8)  Carvedilol 3.125 Mg Tabs (Carvedilol) .... One by mouth bid 9)  Hydrochlorothiazide 25 Mg Tabs (Hydrochlorothiazide) .... One half tablet by mouth once daily  Patient Instructions: 1)  Please schedule a follow-up appointment in 3 months .  2)  Check your blood sugars regularly. If your readings are usually above:  or below 70 you should contact our office.  3)  It is important that your diabetic A1c level is checked every 3 months.  4)  See your eye doctor yearly to check for diabetic eye damage. 5)  Check your feet each night  for sore areas, calluses or signs of infection.

## 2010-10-06 NOTE — Letter (Signed)
Summary: [facility]  [facility]   Imported By: Maryln Gottron 08/07/2010 13:54:32  _____________________________________________________________________  External Attachment:    Type:   Image     Comment:   External Document

## 2010-10-06 NOTE — Assessment & Plan Note (Signed)
Summary: Right ankle cort inj/nn   Vital Signs:  Patient profile:   70 year old female Menstrual status:  postmenopausal Temp:     98 degrees F oral BP sitting:   130 / 70  (left arm) Cuff size:   regular  Vitals Entered By: Sid Falcon LPN (Jan 14, 2010 1:24 PM) CC: Right ankle pain   History of Present Illness: Patient here for consideration of steroid injection right ankle. Chronic right ankle pain. Recent x-rays reveal no acute change but degenerative arthritis as suspected. Patient has morbid obesity. No recent injury.  Poor relief with medication. No recent erythema or warmth. No history of gout. Takes Celebrex 200 mg daily.  Allergies: No Known Drug Allergies  Past History:  Past Medical History: Last updated: 04/02/2009 Arthritis Breast Cancer Chicken pox Type 2 diabetes Hypertension Blood clots (pulmonary emboli) thyroid problems (hypothyroid) Osteoarthritis  Physical Exam  General:  Well-developed,well-nourished,in no acute distress; alert,appropriate and cooperative throughout examination Lungs:  Normal respiratory effort, chest expands symmetrically. Lungs are clear to auscultation, no crackles or wheezes. Heart:  normal rate and regular rhythm.   Extremities:  right ankle reveals no warmth or erythema. No rashes. Fairly good range of motion. Tenderness on palpating at the tibiotalar articulation. Palpable dorsalis pedis pulse. Good capillary refill   Impression & Recommendations:  Problem # 1:  OSTEOARTHRITIS (ICD-715.90)  discussed risk and benefits of corticosteroid injection right ankle including risk of bleeding, bruising, infection and patient wished to proceed. Prepped skin with Betadine. Using sterile technique injected 40 mg Depo-Medrol and one and 1/2 cc plain Xylocaine tibiotalar articulation from anterior medial approach with 25-gauge 1 and 1/2 inch needle. Patient tolerated well.  I have again discussed with pt the necessity of weight loss in  helping manage her osteoarthritis. Her updated medication list for this problem includes:    Celebrex 200 Mg Caps (Celecoxib) ..... Once daily    Adult Aspirin Ec Low Strength 81 Mg Tbec (Aspirin) ..... Once daily    Advil 200 Mg Caps (Ibuprofen) .Marland Kitchen... As needed  Orders: Joint Aspirate / Injection, Intermediate (20605) Depo- Medrol 40mg  (J1030)  Complete Medication List: 1)  Avandamet 10-998 Mg Tabs (Rosiglitazone-metformin) .... One tab two times a day 2)  Arimidex 1 Mg Tabs (Anastrozole) .... Once daily 3)  Celebrex 200 Mg Caps (Celecoxib) .... Once daily 4)  Lisinopril 40 Mg Tabs (Lisinopril) .... Once daily 5)  Synthroid 125 Mcg Tabs (Levothyroxine sodium) .... Once daily 6)  Adult Aspirin Ec Low Strength 81 Mg Tbec (Aspirin) .... Once daily 7)  Advil 200 Mg Caps (Ibuprofen) .... As needed 8)  Carvedilol 3.125 Mg Tabs (Carvedilol) .... One by mouth bid 9)  Hydrochlorothiazide 25 Mg Tabs (Hydrochlorothiazide) .... One half tablet by mouth once daily  Patient Instructions: 1)  keep followup appointment as scheduled. 2)  Followup sooner if you wish for [place] to inject your knee.

## 2010-10-06 NOTE — Assessment & Plan Note (Signed)
Summary: 3 month follow up/cjr   Vital Signs:  Patient profile:   70 year old female Menstrual status:  postmenopausal Weight:      315 pounds Temp:     97.7 degrees F oral BP sitting:   140 / 70  (left arm) Cuff size:   large  Vitals Entered By: Sid Falcon LPN (August 05, 2010 1:27 PM)  History of Present Illness: Patient the following issues  Type 2 diabetes. Has been well controlled. Last A1c 6.1%. Recent discontinuation of Avandamet. We switch to metformin 1000 mg b.i.d. Patient not monitoring blood sugars at home. Other chronic medical problems include hypertension and hypothyroidism. She is sometimes poorly compliant with taking her blood pressure medications. Poor compliance with diet.  History osteoarthritis mostly involving right knee and right ankle. Previous steroid injections the ankle and knee and is requesting the same today. Poor relief at times with Celebrex.  Daily pain and stiffness.  Very sedentary.  Hypertension History:      She complains of peripheral edema, but denies headache, chest pain, palpitations, dyspnea with exertion, orthopnea, PND, neurologic problems, syncope, and side effects from treatment.  She notes no problems with any antihypertensive medication side effects.        Positive major cardiovascular risk factors include female age 71 years old or older, diabetes, and hypertension.  Negative major cardiovascular risk factors include non-tobacco-user status.     Allergies: 1)  Mobic (Meloxicam)  Past History:  Past Medical History: Last updated: 04/02/2009 Arthritis Breast Cancer Chicken pox Type 2 diabetes Hypertension Blood clots (pulmonary emboli) thyroid problems (hypothyroid) Osteoarthritis  Past Surgical History: Last updated: 12/11/2008 Removal 1/2 thyroid 2002 Blood Clots 2003 Breast Cancer 2007 Left knee replacement 2009  Family History: Last updated: 12/11/2008 Family History of Cardiovascular disorder Breast  CA  Social History: Last updated: 12/11/2008 Retired Married Never Smoked Alcohol use-no  Risk Factors: Exercise: no (11/05/2009)  Risk Factors: Smoking Status: never (12/11/2008) PMH-FH-SH reviewed for relevance  Review of Systems       The patient complains of weight gain and peripheral edema.  The patient denies anorexia, fever, weight loss, chest pain, syncope, prolonged cough, abdominal pain, melena, hematochezia, and severe indigestion/heartburn.    Physical Exam  General:  patient is morbidly obese in no distress Ears:  External ear exam shows no significant lesions or deformities.  Otoscopic examination reveals clear canals, tympanic membranes are intact bilaterally without bulging, retraction, inflammation or discharge. Hearing is grossly normal bilaterally. Mouth:  Oral mucosa and oropharynx without lesions or exudates.  Teeth in good repair. Neck:  No deformities, masses, or tenderness noted. Lungs:  Normal respiratory effort, chest expands symmetrically. Lungs are clear to auscultation, no crackles or wheezes. Heart:  normal rate and regular rhythm.   Extremities:  she has moderate 1+ edema lower legs ankles feet bilaterally. Right ankle - very restricted range of motion. No warmth or erythema. No ecchymosis.  No localized tenderness.  no rash.  Diabetes Management Exam:    Foot Exam (with socks and/or shoes not present):       Sensory-Pinprick/Light touch:          Left medial foot (L-4): normal          Left dorsal foot (L-5): normal          Left lateral foot (S-1): normal          Right medial foot (L-4): normal          Right dorsal foot (L-5):  normal          Right lateral foot (S-1): normal       Sensory-Monofilament:          Left foot: normal          Right foot: normal       Inspection:          Left foot: normal          Right foot: normal       Nails:          Left foot: normal          Right foot: normal   Impression &  Recommendations:  Problem # 1:  AODM (ICD-250.00) Avandia has been discontinued. Reassess control in 3 months on metformin 1000 mg b.i.d. Her updated medication list for this problem includes:    Metformin Hcl 1000 Mg Tabs (Metformin hcl) ..... One tab two times a day    Lisinopril 40 Mg Tabs (Lisinopril) ..... Once daily    Adult Aspirin Ec Low Strength 81 Mg Tbec (Aspirin) ..... Once daily  Problem # 2:  OSTEOARTHRITIS (ICD-715.90)  discussed risk and benefits of steroid injection right ankle. Patient consented. Prepped right ankle with Betadine. Using 1 inch 25-gauge needle injected 40 mg of Depo-Medrol and 1 cc of plain Xylocaine. Patient tolerated well.  we have stressed importance of weight loss in managing osteoarthritis as well as exercise. She will look at stationary bicycle Her updated medication list for this problem includes:    Celebrex 200 Mg Caps (Celecoxib) ..... Once daily    Adult Aspirin Ec Low Strength 81 Mg Tbec (Aspirin) ..... Once daily    Advil 200 Mg Caps (Ibuprofen) .Marland Kitchen... As needed  Orders: Joint Aspirate / Injection, Intermediate (20605)  Problem # 3:  HYPERTENSION (ICD-401.9) stress compliance and weight loss Her updated medication list for this problem includes:    Lisinopril 40 Mg Tabs (Lisinopril) ..... Once daily    Carvedilol 3.125 Mg Tabs (Carvedilol) ..... One by mouth bid  Complete Medication List: 1)  Metformin Hcl 1000 Mg Tabs (Metformin hcl) .... One tab two times a day 2)  Arimidex 1 Mg Tabs (Anastrozole) .... Once daily 3)  Celebrex 200 Mg Caps (Celecoxib) .... Once daily 4)  Lisinopril 40 Mg Tabs (Lisinopril) .... Once daily 5)  Synthroid 125 Mcg Tabs (Levothyroxine sodium) .... Once daily 6)  Adult Aspirin Ec Low Strength 81 Mg Tbec (Aspirin) .... Once daily 7)  Advil 200 Mg Caps (Ibuprofen) .... As needed 8)  Carvedilol 3.125 Mg Tabs (Carvedilol) .... One by mouth bid 9)  Hard Nails 2.5 Mg Caps (Biotin) .... Once daily  Hypertension  Assessment/Plan:      The patient's hypertensive risk group is category C: Target organ damage and/or diabetes.  Her calculated 10 year risk of coronary heart disease is 17 %.  Today's blood pressure is 140/70.    Patient Instructions: 1)  Please schedule a follow-up appointment in 3 months .    Orders Added: 1)  Est. Patient Level IV [16109] 2)  Joint Aspirate / Injection, Intermediate [20605]

## 2010-10-06 NOTE — Assessment & Plan Note (Signed)
Summary: 3 MNTH ROV//SLM   Vital Signs:  Patient profile:   70 year old female Menstrual status:  postmenopausal Weight:      315 pounds Temp:     98 degrees F oral BP sitting:   142 / 64  (left arm) Cuff size:   large  Vitals Entered By: Sid Falcon LPN (April 08, 2010 1:15 PM) CC: 3 month follow-up, Hypertension Management   History of Present Illness: Patient seen for several issues as follows  History of arthritis involving multiple joints. Had recent ankle injection and that has improved. Past history left knee replacement. Now complains of right knee pain. No injury. Pain mostly medial aspect. Associated stiffness. Requesting steroid injection. Takes Celebrex with minimal relief. No recent warmth or erythema.  Type 2 diabetes. Blood sugars been fairly well controlled. No symptoms of hyper or hypoglycemia.  Hypertension history. Meds reviewed. Patient took herself off hydrochlorothiazide. She felt she was having some leg cramps and leg pain while on this. In reviewing her medication she apparently never started carvedilol as previously described.  Diabetes Management History:      She has not been enrolled in the "Diabetic Education Program".  She states understanding of dietary principles but she is not following the appropriate diet.  No sensory loss is reported.  Self foot exams are not being performed.  She is checking home blood sugars.  She says that she is not exercising regularly.        Hypoglycemic symptoms are not occurring.  No hyperglycemic symptoms are reported.    Hypertension History:      She denies headache, chest pain, palpitations, dyspnea with exertion, orthopnea, PND, peripheral edema, visual symptoms, neurologic problems, syncope, and side effects from treatment.        Positive major cardiovascular risk factors include female age 19 years old or older, diabetes, and hypertension.  Negative major cardiovascular risk factors include non-tobacco-user  status.     Allergies (verified): No Known Drug Allergies  Past History:  Past Medical History: Last updated: 04/02/2009 Arthritis Breast Cancer Chicken pox Type 2 diabetes Hypertension Blood clots (pulmonary emboli) thyroid problems (hypothyroid) Osteoarthritis  Past Surgical History: Last updated: 12/11/2008 Removal 1/2 thyroid 2002 Blood Clots 2003 Breast Cancer 2007 Left knee replacement 2009  Family History: Last updated: 12/11/2008 Family History of Cardiovascular disorder Breast CA  Social History: Last updated: 12/11/2008 Retired Married Never Smoked Alcohol use-no  Risk Factors: Exercise: no (11/05/2009)  Risk Factors: Smoking Status: never (12/11/2008) PMH-FH-SH reviewed for relevance  Review of Systems  The patient denies anorexia, fever, weight loss, chest pain, syncope, peripheral edema, headaches, hemoptysis, abdominal pain, melena, hematochezia, severe indigestion/heartburn, and depression.    Physical Exam  General:   pleasant obese in no distress Eyes:  pupils equal, pupils round, and pupils reactive to light.   Ears:  External ear exam shows no significant lesions or deformities.  Otoscopic examination reveals clear canals, tympanic membranes are intact bilaterally without bulging, retraction, inflammation or discharge. Hearing is grossly normal bilaterally. Mouth:  Oral mucosa and oropharynx without lesions or exudates.  Teeth in good repair. Neck:  No deformities, masses, or tenderness noted. Lungs:  Normal respiratory effort, chest expands symmetrically. Lungs are clear to auscultation, no crackles or wheezes. Heart:  normal rate and regular rhythm.   Extremities:  no pitting edema.  No lesions.  patient has fairly good range of motion the right knee. No evidence for effusion. No erythema or warmth. Moderate medial joint line tenderness  Neurologic:  alert & oriented X3 and cranial nerves II-XII intact.   Skin:  no rashes.      Impression & Recommendations:  Problem # 1:  AODM (ICD-250.00) recheck A1C. Her updated medication list for this problem includes:    Avandamet 10-998 Mg Tabs (Rosiglitazone-metformin) ..... One tab two times a day    Lisinopril 40 Mg Tabs (Lisinopril) ..... Once daily    Adult Aspirin Ec Low Strength 81 Mg Tbec (Aspirin) ..... Once daily  Orders: Venipuncture (16109) Specimen Handling (60454) TLB-A1C / Hgb A1C (Glycohemoglobin) (83036-A1C)  Problem # 2:  HYPERTENSION (ICD-401.9) Assessment: Deteriorated not to goal. Start carvedilol which she apparently never started previously. The following medications were removed from the medication list:    Hydrochlorothiazide 25 Mg Tabs (Hydrochlorothiazide) ..... One half tablet by mouth once daily Her updated medication list for this problem includes:    Lisinopril 40 Mg Tabs (Lisinopril) ..... Once daily    Carvedilol 3.125 Mg Tabs (Carvedilol) ..... One by mouth bid  Problem # 3:  OSTEOARTHRITIS (ICD-715.90)  patient requests injection right knee. Reviewed risk and benefits. Patient gave consent to injection of 40 mg Depo-Medrol and 2 cc plain Xylocaine right knee.  knee prepped with Betadine. Injected using 22-gauge 1/2 inch needle without difficulty. Her updated medication list for this problem includes:    Celebrex 200 Mg Caps (Celecoxib) ..... Once daily    Adult Aspirin Ec Low Strength 81 Mg Tbec (Aspirin) ..... Once daily    Advil 200 Mg Caps (Ibuprofen) .Marland Kitchen... As needed  Orders: Joint Aspirate / Injection, Large (20610)  Complete Medication List: 1)  Avandamet 10-998 Mg Tabs (Rosiglitazone-metformin) .... One tab two times a day 2)  Arimidex 1 Mg Tabs (Anastrozole) .... Once daily 3)  Celebrex 200 Mg Caps (Celecoxib) .... Once daily 4)  Lisinopril 40 Mg Tabs (Lisinopril) .... Once daily 5)  Synthroid 125 Mcg Tabs (Levothyroxine sodium) .... Once daily 6)  Adult Aspirin Ec Low Strength 81 Mg Tbec (Aspirin) .... Once  daily 7)  Advil 200 Mg Caps (Ibuprofen) .... As needed 8)  Carvedilol 3.125 Mg Tabs (Carvedilol) .... One by mouth bid 9)  Hard Nails 2.5 Mg Caps (Biotin) .... Once daily  Hypertension Assessment/Plan:      The patient's hypertensive risk group is category C: Target organ damage and/or diabetes.  Her calculated 10 year risk of coronary heart disease is 17 %.  Today's blood pressure is 142/64.    Patient Instructions: 1)  Please schedule a follow-up appointment in 1 month.  2)  You need to lose weight. Consider a lower calorie diet and regular exercise.  Prescriptions: CARVEDILOL 3.125 MG TABS (CARVEDILOL) one by mouth bid  #60 x 5   Entered and Authorized by:   Evelena Peat MD   Signed by:   Evelena Peat MD on 04/08/2010   Method used:   Electronically to        [facility]  [place] [address] (retail)       [address] [place] [address]       [place], [place]  [address]       Ph: [phone] or [phone]       Fax: [phone]   RxID:   [id number]

## 2010-11-04 ENCOUNTER — Ambulatory Visit (INDEPENDENT_AMBULATORY_CARE_PROVIDER_SITE_OTHER): Payer: Medicare Other | Admitting: Family Medicine

## 2010-11-04 ENCOUNTER — Encounter: Payer: Self-pay | Admitting: Family Medicine

## 2010-11-04 DIAGNOSIS — E119 Type 2 diabetes mellitus without complications: Secondary | ICD-10-CM

## 2010-11-04 DIAGNOSIS — E039 Hypothyroidism, unspecified: Secondary | ICD-10-CM

## 2010-11-04 DIAGNOSIS — M19079 Primary osteoarthritis, unspecified ankle and foot: Secondary | ICD-10-CM

## 2010-11-04 DIAGNOSIS — I10 Essential (primary) hypertension: Secondary | ICD-10-CM

## 2010-11-04 LAB — TSH: TSH: 1.12 u[IU]/mL (ref 0.35–5.50)

## 2010-11-04 NOTE — Progress Notes (Signed)
  Subjective:    Patient ID: Amy Burns, female    DOB: 1940-12-05, 70 y.o.   MRN: [id number]  HPI  Patient here for medical followup. History of poor compliance. Only taking carvedilol once daily. Sensation of intermittent bilateral hand numbness and stopped taking this regularly. Is taking lisinopril 40 mg daily. Has been reluctant to take diuretics in the past because of urine frequency issues. No orthostatic symptoms. Not checking blood pressures at home.  History type 2 diabetes. Only taking metformin once daily not twice daily as scheduled. Had previously been on Avandamet. Not checking blood sugars. No symptoms of hyperglycemia other than some urine frequency   History hypothyroidism. Needs repeat TSH. Chronic obesity and very sedentary.   Review of Systems  Constitutional: Positive for fatigue. Negative for fever, activity change and appetite change.  Eyes: Negative for visual disturbance.  Respiratory: Positive for shortness of breath. Negative for cough and wheezing.   Cardiovascular: Negative for chest pain and palpitations.  Gastrointestinal: Negative for abdominal pain and blood in stool.  Genitourinary: Negative for dysuria.  Musculoskeletal: Arthralgias: ankle pain, osteoarthritis.  Neurological: Negative for dizziness and headaches.  Psychiatric/Behavioral: Negative for dysphoric mood.       Objective:   Physical Exam     Morbidly obese female in no distress Oropharynx is clear Neck is supple no mass Chest clear to auscultation Heart regular rate Extremities no pitting edema. No lesions. Sensory function is intact to touch    Assessment & Plan:   #1 type 2 diabetes. Recheck A1c #2 hypertension. Poor compliance. Continue lisinopril. Discontinue carvedilol and start Bystolic 5 mg daily. Reassess blood pressure one month #3 hypothyroidism. Check TSH  #4 osteoarthritis ankles. Patient requesting steroid injection. Consider at followup visit

## 2010-11-04 NOTE — Patient Instructions (Signed)
Stop Carvedilol Start Bystolic 5 mg one daily

## 2010-11-05 NOTE — Progress Notes (Signed)
Quick Note:  Pt informed ______ 

## 2010-11-08 IMAGING — NM NM BONE 3 PHASE
2 series · 12 of 12 positions shown · non-contrast
Comparison: Plain films 06/17/2008 and 06/22/2010.

CLINICAL DATA: Left knee pain. Status post knee replacement
06/17/2008.

NUCLEAR MEDICINE THREE PHASE BONE SCAN
TECHNIQUE: Radionuclide angiographic images, immediate static
blood pool images, and 3-hour delayed static images were obtained
after intravenous injection of radiopharmaceutical.
Radiopharmaceutical: 26.2 mCi technetium 99m MDP.

[3 phase bone · 9.33mm/px · 6 of 48 frames shown (1 of 2)]
[frame 5/48  full-range]
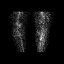
[frame 13/48  full-range]
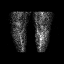
[frame 21/48  full-range]
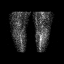
[frame 29/48  full-range]
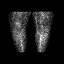
[frame 37/48  full-range]
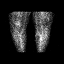
[frame 45/48  full-range]
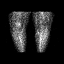

[3 phase bone · 9.33mm/px · 6 of 48 frames shown (2 of 2)]
[frame 5/48  full-range]
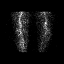
[frame 13/48  full-range]
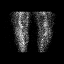
[frame 21/48  full-range]
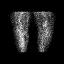
[frame 29/48  full-range]
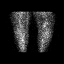
[frame 37/48  full-range]
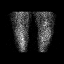
[frame 45/48  full-range]
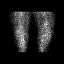

[12 of 12 positions shown; findings below may reference images not displayed]

FINDINGS: There is no abnormal flow or increased activity on blood
pool or delayed images about the patient's left total knee
replacement to suggest loosening.  The patient does have increased
activity about the right knee consistent with degenerative disease
which appears worst in the medial compartment.
IMPRESSION: 1.  Negative for evidence of loosening of the left total knee
replacement.
2.  Right knee osteoarthritis.

## 2010-12-03 ENCOUNTER — Ambulatory Visit (INDEPENDENT_AMBULATORY_CARE_PROVIDER_SITE_OTHER): Payer: Medicare Other | Admitting: Family Medicine

## 2010-12-03 ENCOUNTER — Encounter: Payer: Self-pay | Admitting: Family Medicine

## 2010-12-03 DIAGNOSIS — M1990 Unspecified osteoarthritis, unspecified site: Secondary | ICD-10-CM

## 2010-12-03 DIAGNOSIS — E119 Type 2 diabetes mellitus without complications: Secondary | ICD-10-CM

## 2010-12-03 DIAGNOSIS — I10 Essential (primary) hypertension: Secondary | ICD-10-CM

## 2010-12-03 DIAGNOSIS — R609 Edema, unspecified: Secondary | ICD-10-CM

## 2010-12-03 MED ORDER — FUROSEMIDE 20 MG PO TABS: 20.0000 mg | ORAL_TABLET | Freq: Every day | ORAL | Status: DC

## 2010-12-03 NOTE — Progress Notes (Signed)
  Subjective:    Patient ID: Amy Burns, female    DOB: [birth year], 70 y.o.   MRN: [id number]  HPI Patient seen for followup. She has morbid obesity, hypothyroidism, type 2 diabetes, hypertension, and osteoarthritis. Recent thyroid function normal. A1c 6.4%.  Blood sugar stable. No hypoglycemia. Recent TSH normal range. Presents today with increased edema bilateral lower extremities. Chronic dyspnea unchanged. No orthopnea. She's been reluctant to take diuretics in the past. No history of CHF. No cough. Denies pleuritic pain.  Hypertension with recent addition of bystolic 5 mg to her lisinopril 40 mg.  No orthostasis.  Osteoarthritis mostly involving her right ankle. Previous corticosteroid injections and she is requesting the same day. Takes Celebrex and Advil for arthritis with minimal relief. Early morning stiffness which improves some with walking. Very sedentary. No recent injury.   Review of Systems  Constitutional: Positive for fatigue. Negative for fever, activity change, appetite change and unexpected weight change.  Respiratory: Positive for shortness of breath. Negative for cough and wheezing.   Cardiovascular: Positive for leg swelling. Negative for chest pain and palpitations.  Gastrointestinal: Negative for abdominal pain and abdominal distention.  Genitourinary: Negative for dysuria.  Hematological: Negative for adenopathy. Does not bruise/bleed easily.  Psychiatric/Behavioral: Negative for dysphoric mood.       Objective:   Physical Exam  Constitutional: She is oriented to person, place, and time.       Morbidly obese pleasant 70 year old female  HENT:  Head: Normocephalic and atraumatic.  Eyes: Pupils are equal, round, and reactive to light.  Neck: Neck supple. No thyromegaly present.  Cardiovascular: Normal rate and regular rhythm.   No murmur heard. Pulmonary/Chest: She has no wheezes. She has no rales.  Musculoskeletal: She exhibits edema.       Patient has  1+ pitting edema at ankles feet lower legs bilaterally. No open wounds  She has decreased range of motion right ankle. No erythema or warmth.  Lymphadenopathy:    She has no cervical adenopathy.  Neurological: She is alert and oriented to person, place, and time.  Skin: No rash noted.          Assessment & Plan:  #1  Increased leg and foot edema.  Suspect largely venous stasis.  Elevation and start Lasix 20 mg po qd.  Consider support hose  Recheck one week with BMP then. #2 Osteoarthritis ankles. #3 Type 2 diabetes with hx good control. #4 hypertension not to goal.  Add Lasix 20 mg daily and continue other BP meds as prescribed.  Discussed risks and benefits of corticosteroid injection into ankle joint.  Patient consented.  Prepped ankle with betadine and using sterile technique, injected 40 mg depomedrol and 1 cc of plain xylocaine from anterior approach and pt tolerated well.

## 2010-12-18 ENCOUNTER — Ambulatory Visit (INDEPENDENT_AMBULATORY_CARE_PROVIDER_SITE_OTHER): Payer: Medicare Other | Admitting: Family Medicine

## 2010-12-18 ENCOUNTER — Encounter: Payer: Self-pay | Admitting: Family Medicine

## 2010-12-18 DIAGNOSIS — R609 Edema, unspecified: Secondary | ICD-10-CM

## 2010-12-18 DIAGNOSIS — I10 Essential (primary) hypertension: Secondary | ICD-10-CM

## 2010-12-18 LAB — BASIC METABOLIC PANEL
BUN: 18 mg/dL (ref 6–23)
CO2: 31 mEq/L (ref 19–32)
Chloride: 103 mEq/L (ref 96–112)
Creatinine, Ser: 0.9 mg/dL (ref 0.4–1.2)
Potassium: 3.8 mEq/L (ref 3.5–5.1)

## 2010-12-18 NOTE — Progress Notes (Signed)
  Subjective:    Patient ID: Amy Burns, female    DOB: 05-Feb-1941, 70 y.o.   MRN: [id number]  HPI Followup. Poorly controlled blood pressure and increased peripheral edema. Addition furosemide 20 mg daily. She's had some weight loss and good improvement in edema. Blood pressure also improved. No dizziness. Other medications reviewed. Recent lab work fairly unremarkable.  She continues to have very poor compliance with diet and gets essentially no exercise.   Review of Systems  Constitutional: Negative for fever and chills.  Respiratory: Negative for cough.   Cardiovascular: Negative for chest pain and palpitations.  Genitourinary: Negative for dysuria.  Neurological: Negative for dizziness, syncope and headaches.       Objective:   Physical Exam  Constitutional:       Patient is morbidly obese in no distress  Cardiovascular: Normal rate and regular rhythm.   No murmur heard. Pulmonary/Chest: Effort normal and breath sounds normal. No respiratory distress. She has no wheezes. She has no rales.  Musculoskeletal:       She is just trace edema lower legs bilaterally but this is improved compared to last visit          Assessment & Plan:  #1 hypertension improved #2 edema probably largely venous stasis improved. Check basic metabolic panel today. Continue furosemide in addition to other chronic medications. Routine followup 3 months

## 2010-12-20 ENCOUNTER — Encounter: Payer: Self-pay | Admitting: Family Medicine

## 2010-12-22 NOTE — Progress Notes (Signed)
Quick Note:  Pt informed on home VM ______ 

## 2011-01-07 ENCOUNTER — Ambulatory Visit
Admission: RE | Admit: 2011-01-07 | Discharge: 2011-01-07 | Disposition: A | Discharge status: Home / Self Care | Payer: Medicare Other | Source: Ambulatory Visit | Attending: Oncology | Admitting: Oncology

## 2011-01-07 DIAGNOSIS — Z9889 Other specified postprocedural states: Secondary | ICD-10-CM

## 2011-01-07 DIAGNOSIS — Z0000 Encounter for general adult medical examination without abnormal findings: Secondary | ICD-10-CM

## 2011-01-19 NOTE — Cardiovascular Report (Signed)
NAME:  Amy, Burns NO.:  [id number]   MEDICAL RECORD NO.:  [id number]          PATIENT TYPE:  OIB   LOCATION:  1963                         FACILITY:  [facility]   PHYSICIAN:  Elmore Guise., M.D.DATE OF BIRTH:  [birth year]   DATE OF PROCEDURE:  11/30/2007  DATE OF DISCHARGE:                            CARDIAC CATHETERIZATION   INDICATIONS FOR PROCEDURE:  Abnormal stress test, preoperative  evaluation prior to elective knee surgery.   DESCRIPTION OF PROCEDURE:  The patient is brought to the cardiac cath  lab.  After appropriate informed consent, she is prepped and draped in  sterile fashion.  Approximately 20 mL of 1% lidocaine is used for local  anesthesia.  A 5-French sheath is placed in the right femoral artery  that difficulty.  Coronary angiography, LV angiography were then  performed.  The patient tolerated the procedure well and was transferred  from the cardiac cath lab in stable condition.   FINDINGS:  1. Left Main:  Normal.  2. Left anterior descending:  Moderate-to-large vessel with mild      luminal irregularities.  3. Diagonal-1:  Mild luminal irregularities.  4. Left circumflex:  Nondominant, moderate large vessel with mild      luminal irregularities.  5. Right coronary artery:  Small to moderate-sized vessel with mild      luminal irregularities.  6. LV:  Ejection fraction is 6-65%.  No wall motion abnormalities.      Left ventricular end diastolic pressure is 10 mmHg.   IMPRESSION:  1. 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  2. Preserved left ventricular systolic function with an ejection      fraction of 60-65%.   PLAN:  At this time I would recommend continued aggressive risk factor  modification.  There should be no cardiac contraindications to her  upcoming surgery.  No further cardiac evaluation is needed and the  patient may proceed with elective surgery as scheduled.      Elmore Guise.,  M.D.  Electronically Signed     TWK/MEDQ  D:  11/30/2007  T:  11/30/2007  Job:  [id number]   cc:   Ursula Beath, MD  Marlowe Kays, M.D.

## 2011-01-19 NOTE — H&P (Signed)
NAME:  Amy Burns, Amy Burns NO.:  [id number]   MEDICAL RECORD NO.:  [id number]           PATIENT TYPE:   LOCATION:                                 FACILITY:   PHYSICIAN:  Elmore Guise., M.D.DATE OF BIRTH:  14-Oct-1940   DATE OF ADMISSION:  DATE OF DISCHARGE:                              HISTORY & PHYSICAL   INDICATIONS FOR PROCEDURE:  Abnormal stress test.   HISTORY OF PRESENT ILLNESS:  Amy Burns is a very pleasant 70 year old  white female with past medical history of morbid obesity, hypertension,  diabetes mellitus, hypothyroidism who initially presented for evaluation  on November 20, 2007 as a preoperative consult prior to elective left knee  replacement.  The patient reports that she has had progressive dyspnea  over the last 6 months.  No chest pain.  She reports that since she has  been having trouble with her left knee, she has gained weight and think  she has probably put on somewhere around 40-50 pounds.  She underwent an  adenosine Cardiolite on November 23, 2007 as a preoperative evaluation for  risk stratification.  This showed a reversible inferoapical defect  consistent with inducible ischemia.  She had normal LV systolic function  with an EF of 70%.  Because of her decreased exertional tolerance, she  is now seen for possible cardiac catheterization.  She denies any  problems with her current medications.  No recent fever or chills,  nausea or vomiting or diarrhea.  She has no bleeding problems.   REVIEW OF SYSTEMS:  As per HPI, otherwise negative.   ALLERGIES:  NONE BUT INTOLERANCE TO DIOVAN.   CURRENT MEDICATIONS:  1. Avandamet 10/998 mg twice daily.  2. Lisinopril/HCTZ 20/25 mg once daily.  3. Arimidex 1 mg daily.  4. Synthroid 125 mcg daily.   FAMILY HISTORY:  Positive for heart disease.  Her father died from heart  attack at age 79.  Her mother died from a bad heart valve.   SOCIAL HISTORY:  She is married.  She is retired from office work.   No  tobacco or alcohol.   PAST SURGICAL HISTORY:  1. Thyroid surgery.  2. Breast cancer status post lumpectomy.   PHYSICAL EXAMINATION:  VITAL SIGNS:  Weight is 312 pounds.  Her blood  pressure 138/80, heart rate is 100 and regular.  GENERAL:  She is a very pleasant elderly white female, alert and  oriented x4.  No acute distress.  NECK:  She has no JVD and no bruits.  LUNGS:  Clear.  HEART:  Regular with 2/6 systolic ejection murmur.  ABDOMEN:  Soft, nontender, nondistended.  No rebound or guarding, obese.  EXTREMITIES:  Warm with trace pretibial edema and 1+ pedal pulses noted.  Her right femoral is deep but can be palpated with movement and holding  up of her pannus.   LABORATORY DATA:  Her blood work is pending at time of dictation.   IMPRESSION:  1. Abnormal stress test as a preoperative evaluation prior to elective      surgery.  2. Morbid obesity.  3. Poor exercise  tolerance.  4. History of breast cancer.   PLAN:  1. I discussed the risks and benefits of cardiac catheterization with      her at length.  I do think that coronary angiography is indicated      because of her significant low exertional capacity as well as her      abnormal stress test.  This will be scheduled for later this week.      I did discuss that we will try to go from a femoral approach;      however, if this is unobtainable, we will go from brachial.  We      will hold her Avandamet for 2 days following her procedure and      lisinopril for the day of her procedure. She would like to proceed.      Elmore Guise., M.D.  Electronically Signed     TWK/MEDQ  D:  11/27/2007  T:  11/27/2007  Job:  [id number]

## 2011-01-19 NOTE — Op Note (Signed)
NAME:  Amy Burns, Amy Burns NO.:  [id number]   MEDICAL RECORD NO.:  [id number]          PATIENT TYPE:  INP   LOCATION:  0009                         FACILITY:  [facility]   PHYSICIAN:  Marlowe Kays, M.D.  DATE OF BIRTH:  [birth year]   DATE OF PROCEDURE:  06/17/2008  DATE OF DISCHARGE:                               OPERATIVE REPORT   PREOPERATIVE DIAGNOSIS:  Osteoarthritis, left knee.   POSTOPERATIVE DIAGNOSIS:  Osteoarthritis, left knee.   OPERATION:  Osteonics total knee replacement, left.   SURGEON:  J. Aplington, M.D.   ASSISTANT:  Micki Riley, PAC.   ANESTHESIA:  General.   PATHOLOGY/INDICATIONS FOR PROCEDURE:  She actually has severe arthritis  in both knees with bone-on-bone abutment medially in the right knee.  In  the left knee, in addition, she also has medial subluxation of the femur  on the tibia.  Consequently, this knee which is more symptomatic, was  operated on first. I also, because of the anatomy, elected to use an 80  mm stem into the tibia component.   PROCEDURE:  Prophylactic antibiotics, satisfactory general anesthesia,  pneumatic tourniquet with the lateral hip positioner and SureFoot.  The  left hip was prepped with DuraPrep from tourniquet to ankle and draped  in a sterile field.  IV employed.  The leg was Esmarched out sterilely.  A  time-out performed.  Vertical midline incision down to the patellar  mechanism with median parapatellar incision to open the joint.  The  patellar mechanism was freed up but, because of her size, I was unable  to evert the patella but reflected it lateral-ward.  I undermined the  pes anserinus and medial collateral ligament off the tibia.  Osteophytes  were removed from around the femur the patella and a 5/16-th inch drill  hole placed in the distal femur with the knee in a flexed position.  I  then used the canal finder and the axis aligner for a 5 degree valgus  cut to her left knee.  Because she had a  flexion contracture, I elected  to take 12 mm off the distal femur.  Her knee joint was quite tight and,  because of this, after removing the ACL, PCL complex and a portion of  the menisci, I made a leveling cut on the proximal tibia.  This allowed  me to get the jig in the joint for sizing of the distal femur at size 7.  Scribe lines were placed and the distal femoral cutting jig placed.  I  then cut the femur dorsally volar, posterior chamfering cut, and then an  anterior chamfering cut.  I returned to the tibia, the remnants of  menisci were removed and I sized the tibia at #7.  A base plate was  placed.  Initial drill hole was placed with a step-cut drill followed by  a step-cut drill and the canal finder.  I then placed an intramedullary  rod and used the guide for making a 9 degree cut removing 4 mm from the  medial tibia.  We placed a laminar spreader and removed remnants of  bone  and soft tissue from behind the femoral condyles and then placed the jig  for making the patellar groove.  We followed this by making the  notchplasty, first using the saw to make preliminary cuts and removing  bone with a double-action rongeur.  We then went through a trial  reduction and found that a 12 and perhaps a 15 mm spacer would fit  nicely.  I used the external rod on the tibial baseplate splitting  bimalleolar distance to mark scribe lines on the tibia.  While using an  extension, I sized the patella up to 26 and then used the recess cutting  jig to make a 10 mm recess cut and followed this with a guide for  placing three fixation holes and then used the trial patella to remove  bone around the patella.  We returned to the tibia where using the  scribe lines previously noted, we affixed the base plate to the tibia  with three temporary screws and what were three temporary pins and then  used the tripod apparatus to ream up to a 7 cemented.  We then hand  reamed the tibia up to 12 mm and used the  boss reamer at this level and  then went through a trial reduction with an 80 mm by 12 mm tibial  extension and the tibia component fit intimately on the tibia.  We then  went ahead water picked the knee while the components were being opened  and the final tibial stem extension placed.  After water picking the  knee, we applied methyl methacrylate, which I inserted with a glue gun,  gluing first the tibial component with glue on the underneath surface of  the tibial tray and the tip of the component.  We removed excess  methacrylate and then returned to the femur, which was subsequently  glued in with excess methacrylate being removed and a temporary spacer  placed. While the knee was in extension, we glued in the patella holding  it with a patellar holding clamp.  When the methacrylate hardened, we  trimmed the bits of methacrylate from around the components and checked  posteriorly in the femur.  The wound was irrigated well and the trial  reduction was performed with the 15-mm spacer being the best fit.  Consequently, the  final 15-mm posterior stabilized insert was placed.  The knee went through full range of motion and was nice and stable.  Minimal lateral release was  required and performed.  The wound was then  closed in layers over a Hemovac with interrupted #1 Vicryl in two layers  and the quadriceps tendon distally in two layers and in the synovium and  capsule, 2-0 Vicryl for subcutaneous tissue and staples in the skin.  Betadine and Adaptic pressure dressing were applied.  The tourniquet was  released with 375 mmHg with an hour and 46 minutes of tourniquet time  elapsed.  There was no blood loss.  She tolerated the procedure well and  was taken to recovery room in satisfactory condition with no known  complications.           ______________________________  Marlowe Kays, M.D.     JA/MEDQ  D:  06/17/2008  T:  06/17/2008  Job:  [id number]

## 2011-01-19 NOTE — H&P (Signed)
NAME:  Amy Burns, Amy Burns NO.:  [id number]   MEDICAL RECORD NO.:  [id number]          PATIENT TYPE:  INP   LOCATION:  NA                           FACILITY:  [facility]   PHYSICIAN:  Marlowe Kays, M.D.  DATE OF BIRTH:  05-18-1941   DATE OF ADMISSION:  06/17/2008  DATE OF DISCHARGE:                              HISTORY & PHYSICAL   CHIEF COMPLAINT:  Pain in my knees, more so on the left than the  right.   PRESENT ILLNESS:  This 70 year old white female is seen by [place] for  continuing problems concerning her knees.  She has developed more severe  arthritis on the left confirmed by x-ray as well as her complaints.  She  was to have had surgery some years ago.  However, she had thyroid  surgery and eventually had breast cancer which has been treated, and now  is ready for total knee replacement arthroplasty.  X-rays have shown  advanced patellofemoral arthritis and bone-on-bone abutment.  After much  discussion including the risks and benefits of surgery as well as  clearance from [facility] with a nuclear study as well as a  cardiac catheterization done by Dr. Rosine Abe, he did not feel  there was any cardiac contraindication to her total knee replacement  arthroplasty.  We will go ahead with this procedure.   PAST MEDICAL HISTORY:  This lady has developed a considerable amount of  obesity due to her level of inactivity.  She is being treated for sleep  apnea, hypertension, non-insulin diabetes.  She has a history of blood  clots in the past.  She has survived breast cancer and had an excision  of a group of cancer cells in September 2007.   CURRENT MEDICATIONS:  1. Avandamet 10/998 mg b.i.d.  2. Aspirin 81 mg daily (she will stop this prior to surgery).  3. Synthroid 125 mcg daily.  4. Celebrex 200 mg daily.  5. Lisinopril 40 mg daily.  6. Arimidex 1 mg daily.   PAST SURGERIES:  She had a partial thyroidectomy in September 2002 and  breast cancer  as mentioned above.   FAMILY HISTORY:  Positive for heart disease in the father dying at 74.  Mother died at 70 years of age of heart disease.  She has 5 brothers, 2  expired and 3 living all with cardiac and pulmonary disease.   SOCIAL HISTORY:  The patient is married.  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  No intake of tobacco or alcohol  products.  She will depend on her husband as well as home health for  care at home after the surgery.   REVIEW OF SYSTEMS:  CNS:  No seizure, stroke, paralysis, numbness or  double vision.  RESPIRATORY: The patient has dyspnea on exertion, but no  hemoptysis.  CARDIOVASCULAR:  No chest pain, angina, orthopnea.  GASTROINTESTINAL:  No nausea, vomiting or bloody stool.  GENITOURINARY:  No discharge, dysuria, hematuria.  MUSCULOSKELETAL:  Primarily in  present illness.   PHYSICAL EXAMINATION:  GENERAL:  Alert and fully oriented 70 year old  white female who is 5 feet 2 inches tall and weighs 309 pounds.  HEENT:  Normocephalic.  PERRLA, EOM intact.  Oropharynx is clear.  CHEST:  Clear to auscultation.  No rhonchi, no rales, no wheezes.  HEART:  Regular rate and rhythm.  No murmurs are heard.  ABDOMEN:  Largely obese, nontender.  Liver and spleen not felt.  GENITALIA/RECTAL/PELVIC/BREASTS:  Not done and not pertinent to present  illness.  EXTREMITIES:  As in present illness above with crepitus and pain with  range of motion.   ADMISSION DIAGNOSES:  1. Osteoarthritis left knee.  2. Sleep apnea.  3. Hypertension.  4. Non-insulin dependent diabetes mellitus.  5. History of breast cancer.   PLAN:  The patient will undergo total knee replacement arthroplasty of  the left knee.  Should we have any medical problems, we will certainly  ask for a hospitalist to help [place] with her.  If he have any cardiology  problem, we will contact the physicians at [facility].  Plan to have home health, hopefully with  [place].  Today  during the history and physical, I went over in detail the risks and  benefits of surgery as well as the perioperative course.      [name].    ______________________________  Marlowe Kays, M.D.    DLU/MEDQ  D:  06/14/2008  T:  06/14/2008  Job:  [id number]

## 2011-01-22 NOTE — Discharge Summary (Signed)
[facility]  Patient:    Amy Burns, Amy Burns Visit Number: [id number] MRN: [id number]          Service Type: SUR Location: 2S 9629 01 Attending Physician:  Caleen Essex Dictated by:   Ollen Gross. Vernell Morgans, M.D. Admit Date:  05/24/2001 Discharge Date: 05/26/2001                             Discharge Summary  HISTORY OF PRESENT ILLNESS:  Briefly, Ms. Schrupp is a 70 year old white female who had a right thyroid nodule with suspicious pathology on FNA.  She was taken to the operating room on September 18 and underwent a right thyroid lobectomy.  Her postoperative course was complicated by obstruction of her airway in the recovery room, probably secondary to morbid obesity.  At the time, her neck was examined and it was completely soft and there was no evidence for hematoma and she had been talking well prior to that, suggesting that her cord function was fine.  She required reintubation and mechanical ventilation overnight.  The following day, she was able to be extubated and at that time again she had no evidence for hematoma in her neck and once extubated had a good strong voice and an adequate cough.  Her diet was then advanced and by the following day she was ready for discharge to home.  DISCHARGE MEDICATIONS:  She is to resume her home medications.  She was given a prescription then for Darvocet, as well as Synthroid as replacement.  ACTIVITY:  As tolerated.  DIET:  No restrictions.  WOUND CARE:  She is allowed to shower and recommended to keep her wound clean and dry.  FOLLOW-UP:  She is to follow up in one week with me in the clinic for a wound check.  CONDITION ON DISCHARGE:  Stable.  DISPOSITION:  She is discharged home.  FINAL DIAGNOSIS:  Right thyroid nodule. Dictated by:   Ollen Gross. Vernell Morgans, M.D. Attending Physician:  Caleen Essex DD:  06/05/01 TD:  06/05/01 Job: [id number]

## 2011-01-22 NOTE — Consult Note (Signed)
[facility]  Patient:    Amy Burns, Amy Burns Visit Number: [id number] MRN: [id number]          Service Type: GON Location: GYN Attending Physician:  Jeannette Corpus Dictated by:   Rande Brunt. Clarke-Pearson, M.D. Proc. Date: 07/12/01 Admit Date:  07/12/2001   CC:         Marcelle Overlie, M.D.  Telford Nab, R.N.   Consultation Report  REFERRING PHYSICIAN:  Dr. Marcelle Overlie.  REASON FOR CONSULTATION:  [age] white female who is seen because of postmenopausal bleeding and inability to obtain an endometrial sampling. The patient has had intermittent bleeding for the past several years.  She was evaluated by Dr. Vincente Poli and a D&C was attempted on September 18th.  Because of redundancy and length of the patients vagina and body habitus, the cervix was unable to be visualized and therefore a D&C could not be performed.  Patient subsequently had an ultrasound that shows the endometrial stripe to be 12 to 14 mm; in addition, she has a 5.8-cm uterine fibroid.  No adnexal masses are noted.  Patient has no significant gynecologic history.  PAST MEDICAL HISTORY:  Patient has mild hypertension which is not treated. She has recently had a thyroid nodule which was benign and excised.  She takes Synthroid.  DRUG ALLERGIES:  None.  PAST SURGICAL HISTORY:  Thyroid surgery.  OBSTETRICAL HISTORY:  Gravida 3 with three spontaneous vaginal deliveries, the largest weighing in excess of 8 pounds.  REVIEW OF SYSTEMS:  Otherwise negative.  PHYSICAL EXAMINATION:  VITAL SIGNS:  Height 5 feet 2 inches.  Weight 320 pounds.  HEENT:  Negative.  NECK:  Supple without thyromegaly.  ABDOMEN:  The abdomen is massively obese and distended without any evidence of organomegaly or ascites.  PELVIC:  EGBUS is normal.  The vagina appears normal with no lesions, however, it is very redundant and long and using multiple speculums, I am unable  to visualize it.  On bimanual exam, I am able to palpate the cervix.  Using my hand to guide a Pipelle aspirator, the uterus is entered.  It measures at least 8 cm in length.  Two passes with the Pipelle are performed, obtaining an endometrial biopsy, which is submitted to pathology.  The patient tolerated the procedure well.  The uterus and cervix appear to be mobile and I do not feel any parametrial or adnexal masses.  IMPRESSION:  Postmenopausal bleeding with a thickened endometrial stripe.  We will await endometrial biopsy before making any further recommendations. Pap smears are also performed today. Dictated by:   Rande Brunt. Clarke-Pearson, M.D. Attending Physician:  Jeannette Corpus DD:  07/12/01 TD:  07/13/01 Job: [id number]

## 2011-01-22 NOTE — Discharge Summary (Signed)
NAME:  Amy Burns, Amy Burns NO.:  [id number]   MEDICAL RECORD NO.:  [id number]          PATIENT TYPE:  INP   LOCATION:  1610                         FACILITY:  [facility]   PHYSICIAN:  Marlowe Kays, M.D.  DATE OF BIRTH:  08-26-1941   DATE OF ADMISSION:  06/17/2008  DATE OF DISCHARGE:  06/21/2008                               DISCHARGE SUMMARY   ADMITTING DIAGNOSES:  1. Osteoarthritis of left knee.  2. Sleep apnea.  3. Hypertension.  4. Non-insulin-dependent diabetes mellitus.  5. History of breast cancer.  6. Mild postoperative anemia.   OPERATION:  On June 17, 2008, the patient underwent Osteonics total  knee replacement arthroplasty to the left knee.  D Ashley Akin PA-C  assisted.   BRIEF HISTORY:  This 70 year old lady had continuing problems concerning  her knees.  She has had more problem on the left with increasing  problems with her day-to-day activities and x-rays confirmed end-stage  arthritis.  She was scheduled for knee surgery some time in the past,  but thyroid surgery, as well as breast cancer interrupted this plan.  It  has now been treated and she is ready for surgery.  She had clearance  from [facility] and done by Dr. Reyes Ivan, and he cleared her  for surgery.  Therefore, all was ready for the above procedure.   COURSE IN THE HOSPITAL:  The patient tolerated the surgical procedure  quite well.  She was slow early on with her ambulation and physical  therapy.  She had trouble with sleep apnea and did not want to use a  CPAP due to claustrophobia.  She really had no respiratory problems  during the hospitalization.  She used nasal CPAP instead.  She began  progressing with physical therapy, remained afebrile, dressing was  changed with no evidence of any drainage.  Neurovascular remained intact  in her right lower extremity.  Even though she had some postoperative  anemia, she was totally asymptomatic.  She was up to the bathroom  without difficulty, performing hygiene and her husband was able to  assist at home along with home health and arrangements were made for  discharge.   She was placed on Coumadin protocol postoperatively for prevention of  DVT.   LABORATORY DATA:  Laboratory values in the hospital showed a  preoperative CBC essentially within normal limits.  Hemoglobin was 12.9,  hematocrit 38.1.  Final hemoglobin was 8.1, hematocrit was 25.5.  Blood  chemistries remained normal throughout her hospital.  She did have a  little spike in her potassium at 5.2, but this was normal at 4.5 at  discharge.  Her GFR was 27, which was low.  Electrocardiogram showed  sinus rhythm.   CONDITION ON DISCHARGE:  Improved, stable.   PLAN:  The patient is discharged to her home to continue with her home  medications, which consist of 81 mg of aspirin daily (she will not take  this until the end of her Coumadin protocol).  Avandamet 2 mg/1000 mg 2  daily, lisinopril 40 mg daily in the morning, Synthroid 125 mcg daily in  the  morning, Celebrex 200 mg daily in the morning (will resume this  after her Coumadin protocol as well).  Arimidex 1 mg daily in the  morning.  She was given Vicodin for discomfort, Robaxin as a muscle  relaxant and a Coumadin prescription for the Coumadin  protocol.  Use dry dressing p.r.n.  Return to see [place] at 2 weeks after  date of surgery.  She may weightbear as tolerated on the operative  extremity.  At the time of discharge, Dr. Leslee Home spent time with the  patient and discussed the discharge plan.      [name].    ______________________________  Marlowe Kays, M.D.    DLU/MEDQ  D:  07/06/2008  T:  07/06/2008  Job:  [id number]

## 2011-01-22 NOTE — Op Note (Signed)
[facility]  Patient:    Amy Burns, Amy Burns Visit Number: [id number] MRN: [id number]          Service Type: SUR Location: 2S 8413 01 Attending Physician:  Caleen Essex Proc. Date: 05/24/01 Admit Date:  05/24/2001 Discharge Date: 05/26/2001                             Operative Report  PREOPERATIVE DIAGNOSIS:  Postmenopausal bleeding.  POSTOPERATIVE DIAGNOSIS:  Postmenopausal bleeding.  PROCEDURE:  Examination under anesthesia with attempted dilation and curettage.  SURGEON:  Marcelle Overlie, M.D.  ANESTHESIA:  General.  ESTIMATED BLOOD LOSS:  Minimal.  DESCRIPTION OF PROCEDURE:  The patient was taken to the operating room by Dr. Carolynne Edouard for a right thyroid lobectomy which was done initially and without incident.  At that point, at the conclusion of that portion of the surgery, I then scrubbed in, and the patient was placed in the lithotomy position.  The vagina and vulva were prepped and draped in a sterile fashion.  In-and-out catheter was used to empty the bladder.  Because of the patients morbid obesity, despite use of several various sizes of vaginal speculums and vaginal retractors as well as abdominal retractors into the vagina, the cervix was approximately 12-15 inches away from the introitus and was unable to be completely visualized secondary to the patients redundant tissue in the vagina.  The cervix was grasped with a tenaculum; however, the tenaculum had to be entered its entire length into the vagina to reach the cervix and with pulling the cervix, the uterus did not descend at all.  Because of (1) the patients anatomy and (2) her body habitus, the instruments to dilate her cervix and perform a curettage were not long enough and because of these limitations, the procedure was abandoned.  At this point, our plan will be to evaluate the endometrial stripe with pelvic ultrasound and if indeed the patients endometrial stripe is thickened,  she will need some sort of diagnostic procedure with special instrumentation. Attending Physician:  Caleen Essex DD:  06/09/01 TD:  06/10/01 Job: [id number]

## 2011-01-22 NOTE — Op Note (Signed)
NAME:  SANTIA, LABATE NO.:  [id number]   MEDICAL RECORD NO.:  [id number]          PATIENT TYPE:  OBV   LOCATION:  5736                         FACILITY:  [facility]   PHYSICIAN:  Ollen Gross. Vernell Morgans, M.D. DATE OF BIRTH:  [birth year]   DATE OF PROCEDURE:  05/24/2006  DATE OF DISCHARGE:  05/25/2006                                 OPERATIVE REPORT   PREOPERATIVE DIAGNOSIS:  Left breast cancer.   POSTOPERATIVE DIAGNOSIS:  Left breast cancer.   PROCEDURE:  Left breast needle-localized lumpectomy and sentinel node biopsy  with injection of blue dye.   SURGEON:  Dr. Carolynne Edouard.   ANESTHESIA:  General   PROCEDURE:  After informed consent was obtained, the patient was brought to  the operating room, placed in the supine position on the operating room  table.  After adequate induction of general anesthesia, the patient's left  breast, chest and axilla were prepped with Betadine and draped in usual  sterile manner.  Earlier in the day, the patient undergone a needle  localizing procedure as well as injection of the nuclear medicine tracer.  At this point, 2 mL of methylene blue and 3 mL of injectable saline were  also injected in the subareolar position, and the breast was massaged for  several minutes.  The Neoprobe 2000 was then used to examine the axilla, and  there appeared to be a single hot spot in the left axilla.  A transverse  incision was made in the left axilla overlying this hot spot.  This was done  with a 15 blade knife.  This incision was carried down through the skin and  subcutaneous tissue sharply with the electrocautery until the axilla was  entered.  The direction of the dissection was then determined using the  Neoprobe.  Blunt hemostat dissection was used to then identify the four  sentinel lymph nodes.  The lymph nodes were dissected free from the rest of  the axillary fatty tissue by a combination of sharp dissection with  electrocautery and blunt hemostat  dissection.  The lymphatics of these lymph  nodes were then clamped with hemostats, divided and ligated with 3-0 Vicryl  suture ligatures.  Each of these lymph nodes was then sent to pathology for  touch preps and further evaluation, and the initial touch preps on all of  these lymph nodes were negative.  Once this was accomplished, there was no  other radioactivity in the axilla and no other palpable nodes.  The deep  layer of the axilla was then closed with interrupted 3-0 Vicryl stitches,  and the skin was closed with a running 4-0 Monocryl subcuticular stitch.  Attention was then turned to the left breast.  A wire had been placed in the  left breast earlier in the day to localize the area of tumor.  A curvilinear  incision was made next to the wire.  This incision was carried down through  the skin and subcutaneous tissue sharply with the electrocautery.  A  circular portion of breast tissue was then taken out around the path of the  wire.  This was all done sharply with the electrocautery.  Once this was  removed, the specimen was then oriented with a long stitch being lateral and  short stitch being superior and sent to pathology for further evaluation.  Hemostasis was achieved using the Bovie electrocautery.  The wound was then  irrigated with copious amounts of saline.  The deep layer of the incision  was then closed with interrupted 3-0 Vicryl  stitches, and the skin was closed with a running 4-0 Monocryl subcuticular  stitch.  Benzoin, Steri-Strips and sterile dressings were applied.  The  patient tolerated the procedure well. At the end of the case, all needle,  sponge and instrument counts were correct.  The patient was then awakened  and taken to recovery in stable condition.      Ollen Gross. Vernell Morgans, M.D.  Electronically Signed     PST/MEDQ  D:  05/29/2006  T:  05/31/2006  Job:  [id number]

## 2011-01-22 NOTE — Op Note (Signed)
[facility]  Patient:    Amy Burns, Amy Burns Visit Number: [id number] MRN: [id number]          Service Type: SUR Location: [id number] Attending Physician:  Caleen Essex Proc. Date: 05/24/01 Admit Date:  05/24/2001 Discharge Date: 05/26/2001                             Operative Report  PREOPERATIVE DIAGNOSIS:  Right thyroid nodule with suspicious pathology by fine-needle aspiration.  POSTOPERATIVE DIAGNOSIS:  Right thyroid nodule with suspicious pathology by fine-needle aspiration.  PROCEDURE:  Right thyroid lobectomy.  SURGEON:  Ollen Gross. Vernell Morgans, M.D.  ASSISTANT:  Velora Heckler, M.D.  ANESTHESIA:  General endotracheal.  DESCRIPTION OF PROCEDURE:  After informed consent was obtained, the patient was brought to the operating room and placed in the supine position on the operating room table.  After adequate induction of general anesthesia, a roll was placed behind the patients shoulders to try to help extend her neck.  She was placed in a slightly head-up position, and her neck and chest area were prepped with Betadine and draped in the usual sterile manner.  A small skin incision was made transversely anteriorly along her neck along her native skin lines with a 15 blade knife.  This incision was carried down through the rest of the skin and subcutaneous tissues with the Bovie electrocautery.  This incision was also carried down through the platysma using the Bovie electrocautery.  At this point, the platysmal layer was grasped both superiorly and inferiorly with Allis clamps, and a plane was created underneath the platysma both superiorly and inferiorly using blunt dissection and the Bovie electrocautery.  Once this was accomplished, a Mahorner retractor was deployed, and the dissection was then carried out along the midline using the Bovie electrocautery until the trachea and isthmus of the thyroid gland were identified.  Once this was  accomplished, dissection of the right thyroid lobe was begun initially using a blunt dissection with a Ketner. Blunt dissection was then carried out along the lateral edge of the gland along its mid portion using a mosquito hemostat until the middle thyroid vein and inferior thyroid artery were identified.  These structures were tied on the stay side with 3-0 silk ties and then clamped with small vascular clips, and then the structures were divided between the two sets of clips. Dissection was carried out staying on the capsule of the thyroid gland, and the thyroid gland was gently rolled medially.  As the dissection progressed, several smaller vessels required serially clamping with small vascular clips and ligating with scissors.  The dissection was then carried along the superior pole of the thyroid gland.  The area of the superior laryngeal nerve was never seen, as the dissection was maintained right on the capsule of the thyroid gland, and the superior pole of the thyroid gland was clamped with a right angle clamp and divided on the thyroid gland side of this clamp.  After tying the thyroid gland side of the pole with a 2-0 silk tie, the superior pole was then divided, and the stay side was tied with a 2-0 silk tie and then clamped with a medium vascular clip.  The superior parathyroid gland was identified and dissected carefully off of the thyroid gland, maintaining its blood supply.  Attention was then turned inferiorly in the gland, and again, blunt dissection was carried out along the edge of the  gland, taking care to stay on the capsule of the thyroid gland.  Once the gland had been rolled up and medially enough, the area where the recurrent laryngeal nerve was entering the larynx was identified, but the nerve was never dissected out, but it was clear that the dissection was all above the level where the nerve travelled. Once the gland had been rolled enough, the attachments of the  gland to the trachea were able to be taken down sharply with the Bovie electrocautery until the right lobe was free from the surrounding tissues.  The isthmus was then clamped with a hemostat and divided and the specimen passed off and sent to pathology for frozen section evaluation.  The isthmus was then tied with a 3-0 Vicryl suture ligature.  The wound was then examined and found to be hemostatic.  A piece of Surgicel was placed in the bed where the thyroid gland had been to help with further hemostasis.  At the end of the case, the wound was dry and completely hemostatic.  The strap muscles were then reapproximated along the midline using interrupted 3-0 Vicryl sutures.  The platysma was also reapproximated using interrupted 3-0 Vicryl sutures.  The skin was then closed with a running 4-0 Monocryl subcuticular stitch.  Benzoin and Steri-Strips and a sterile dressing were applied.  The patient tolerated the procedure well. At the end of the case, all sponge, needle, and instrument counts were correct.  The patient was awakened and taken to the recovery room in stable condition. Attending Physician:  Caleen Essex DD:  06/05/01 TD:  06/05/01 Job: [id number]

## 2011-01-22 NOTE — H&P (Signed)
[facility]  Patient:    Amy Burns, Amy Burns                        MRN: [id number] Adm. Date:  10/27/00 Attending:  Fayrene Fearing P. Aplington, M.D. Dictator:   Dorie Rank, P.A. CC:         Dietrich Pates, M.D., [facility]  [name], M.D., [facility]   History and Physical  DATE OF BIRTH:  1941-03-19.  CHIEF COMPLAINT:  Left knee pain.  HISTORY OF PRESENT ILLNESS:  Amy Burns has had a history of osteoarthritis to the left knee for quite some.  She has tried multiple anti-inflammatories which have not helped.  The pain is to a point where it is interfering with her activities of daily living.  On physical exam of the left knee, there is some subpatellar grating bilaterally.  The left knee has some tenderness over the medial joint with a negative McMurrays test and no ligamentous instability.  She does have crepitants noted.  Radiographs on February 24, 2000 showed significant medial compartment narrowing with very minimal joint space remaining on the left.  The lateral view showed that she had advanced patellofemoral disease.  There is edema noted to the lower extremities, with no palpable pulses noted; the patient was referred to [facility] in June of 2001 to evaluate this.  They performed lower extremity arterial Dopplers on February 26, 2000 which showed no evidence of disease.  It was felt that due to the patients advanced disease and decrease in activities of daily living, she would benefit from undergoing a total knee arthroplasty.  The risks and benefits were discussed with the patient in detail as well as the procedure itself; at that time, the patient elected to proceed with a total knee arthroplasty.  She set up a medical clearance with her family doctor, Dr. Babette Relic R. Spear in [place].  PAST MEDICAL HISTORY 1. 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 2. Obesity. 3. Hypertension, per patient report.  PAST SURGICAL HISTORY:  None.  MEDICATIONS:   None.  ALLERGIES:  No known drug allergies.  SOCIAL HISTORY:  The patient is married with three children.  She stays at home.  She donated two units of autologous blood for the procedure.  She lives in a one-level home.  She plans for home health PT or rehab if needed.  She is a nondrinker and denies any tobacco use.  FAMILY HISTORY:  Father deceased at age 21 due to a myocardial infarct. Mother deceased at age 30, history of valvular heart disease.  She had a brother who died at age 47 of heart and lung problems.  Two brothers are alive and have heart problems as well.  REVIEW OF SYSTEMS:  GENERAL:  No fevers, chills, night sweats or bleeding tendencies.  PULMONARY:  No shortness of breath, productive cough or hemoptysis.  CARDIOVASCULAR:  No chest pain, angina or orthopnea.  GI:  No nausea, vomiting, diarrhea, constipation, melena or bloody stools.  GU:  No dysuria, hematuria or discharge.  MUSCULOSKELETAL:  Bilateral knee pain as described in the history of present illness.  No other joint pain or erythema.  PHYSICAL EXAMINATION  GENERAL:  Obese 70 year old white female appears somewhat anxious at todays visit.  VITALS:  Pulse 112, respirations 16, blood pressure 155/90.  NECK:  Supple.  Negative for carotid bruits bilaterally.  LUNGS:  Clear to auscultation bilaterally.  No wheezes, rhonchi or rales.  BREASTS:  Not pertinent  to present illness.  HEART:  S1, S2.  No murmur, rub or gallop.  Heart is regular in rate and rhythm.  ABDOMEN:  Rotund.  Positive bowel sounds in all four quadrants.  Abdomen is soft and nontender.  GU:  Not pertinent to present illness.  EXTREMITIES:  See history of present illness for physical exam of the left knee.  SKIN:  There are trace dorsalis pedis and posterior tibialis pulses bilaterally.  She has been evaluated by [facility]TS, as noted above.  There is 2+ pitting edema to the lower extremities.  No evidence of skin breakdown.  NEUROLOGIC:   EOMs intact.  PERRLA.  LABORATORY AND X-RAY FINDINGS:  Preop labs and x-rays are pending at this time.  IMPRESSION 1. Osteoarthritis to bilateral knees, left greater than right. 2. Obesity. 3. History of hypertension.  PLAN:  She was seen by her family medical doctor, Dr. Herb Grays, who recommended that she undergo a cardiology workup due to the fact that she had some nonspecific ST-T wave changes.  She was referred to Dr. Dietrich Pates at [facility], who recommended that she undergo a cardiac stress test; this is pending at this time.  Her surgery is pending the results of her cardiac stress test and clearance from Dr. Tenny Craw.  If she is cleared for surgery, she will be scheduled for a total knee arthroplasty with Dr. Illene Labrador. Aplington on October 27, 2000.DD:  10/21/00 TD:  10/22/00 Job: [id number]

## 2011-01-22 NOTE — Discharge Summary (Signed)
[place]  Patient:    Amy Burns, Amy Burns Visit Number: [id number] MRN: [id number]          Service Type: MED Location: [phone] Attending Physician:  Amy Burns Dictated by:   Amy Burns, M.D. Admit Date:  09/30/2001 Discharge Date: 10/05/2001   CC:         Amy Burns, M.D.  Amy Burns, M.D.   Discharge Summary  DATE OF BIRTH:  01-11-1941  CONSULTING PHYSICIANS:  Amy Burns, M.D.  PROCEDURE:  None.  DISCHARGE DIAGNOSES: 1. Bilateral pulmonary embolus (negative lower extremity Dopplers). 2. Iron deficiency anemia (admission hemoglobin 8.1, discharge 8.7, total    iron 12, TIBC 325, ferritin 10, percent saturation 4) felt secondary to    dysfunctional uterine bleeding. 3. Dysfunctional uterine bleeding presumed secondary to Provera, under    evaluation by Dr. Vincente Burns. 4. History of hypothyroidism (status post partial thyroidectomy right lobe    in September of 2002), biopsy negative. 5. Osteoarthritis of the knees. 6. History of morbid obesity. 7. Borderline hypertension. 8. History of respiratory arrest during thyroid surgery.  DISCHARGE MEDICATIONS: 1. Synthroid 0.125 mg p.o. q.d. 2. Coumadin 5 mg 1-1/2 p.o. q.d. to be adjusted by Amy Burns. 3. Ferrous sulfate 325 mg p.o. t.i.d. until follow-up iron studies indicate    sufficient replacement.  The patient is to discontinue Provera, as she had prior.  FOLLOW-UP:  Pro-time January 31 and February 3.  Amy Burns appointment Thursday, February 6, at 12 p.m.  Dr. Vincente Burns on February 3, for repeat endometrial biopsy.  HISTORY OF PRESENT ILLNESS:  The patient is a 70 year old morbidly obese woman whose past medical history has most recently involved a partial thyroidectomy, and the development of dysfunctional uterine bleeding while on Provera, currently under the care of Dr. Vincente Burns, her gynecologist.  She presented to her primary doctor  complaining of shortness of breath and was sent for a chest CT to rule out PE.  This indeed confirmed bilateral pulmonary emboli and she was arranged for direct admission for further therapy.  For details of presentation, please see Dr. Blanche Burns report.  HOSPITAL COURSE:  #1 - Bilateral pulmonary emboli.  Amy Burns was admitted in stable condition; blood pressure 159/88, pulse 111, and oxygenation 97% on 2 liters (room air not obtained).  She was placed on heparin followed by Coumadin.  Lower extremity Dopplers were negative, although, the study was technically difficult secondary to habitus.  The etiology of her thromboembolic episode is unknown.  Her risk factors include morbid obesity, sedentary lifestyle, and hormone replacement therapy, but other underlying genetic or hypercoagulable disorders have not been ruled out.  At this time, factor V leiden, protein C mutation, and homosysteine levels are pending.  She will require the remainder of her hypercoagulable panel to be drawn at the time of Coumadin cessation, recommended at 6 to 12 months.  Should her genetic studies return indicative of a coagulation disorder, she should remain on Coumadin lifelong.  Her pulmonary status has remained stable.  She has very little symptomatology, most remarkably mild dyspnea on exertion.  She is currently on room air with no chest pain.  Anticoagulation will be followed by Dr. Yehuda Burns office.  #2 - Dysfunctional uterine bleeding.  This was not exacerbated by ongoing anticoagulation in hospital.  Dr. Vincente Burns consulted, who evaluated a vaginal ultrasound performed during this hospitalization.  Unfortunately, the endometrial stripe was not identified.  She will require a follow-up endometrial biopsy  which Dr. Vincente Burns will persue in February following discharge.  #3 - Iron deficiency anemia.  I do not have Amy Burns baseline laboratories available.  Certainly her iron indices are notable for iron  deficiency, which will require aggressive iron supplementation until iron stores are repleted. This may be filed either in Dr. Yehuda Burns or Dr. Mariana Burns office.  If the degree of her blood loss is out of proportion to her dysfunctional uterine bleeding, she will require a gastrointestinal evaluation.  #4 - History of hypothyroidism with partial thyroidectomy.  TSH noted to be normal at 1.048.  No adjustment in medication was required.  Amy Burns was discharged in stable condition. Dictated by:   Amy Burns, M.D. Attending Physician:  Amy Burns DD:  10/05/01 TD:  10/05/01 Job: [id number]

## 2011-03-16 ENCOUNTER — Other Ambulatory Visit: Payer: Self-pay | Admitting: Family Medicine

## 2011-03-18 ENCOUNTER — Other Ambulatory Visit: Payer: Self-pay | Admitting: Family Medicine

## 2011-03-19 ENCOUNTER — Ambulatory Visit (INDEPENDENT_AMBULATORY_CARE_PROVIDER_SITE_OTHER): Payer: Medicare Other | Admitting: Family Medicine

## 2011-03-19 ENCOUNTER — Encounter: Payer: Self-pay | Admitting: Family Medicine

## 2011-03-19 DIAGNOSIS — E039 Hypothyroidism, unspecified: Secondary | ICD-10-CM

## 2011-03-19 DIAGNOSIS — I10 Essential (primary) hypertension: Secondary | ICD-10-CM

## 2011-03-19 DIAGNOSIS — E119 Type 2 diabetes mellitus without complications: Secondary | ICD-10-CM

## 2011-03-19 LAB — BASIC METABOLIC PANEL
Calcium: 9.3 mg/dL (ref 8.4–10.5)
GFR: 71.03 mL/min (ref >60.00)
Potassium: 4.4 mEq/L (ref 3.5–5.1)
Sodium: 141 mEq/L (ref 135–145)

## 2011-03-19 MED ORDER — CELECOXIB 200 MG PO CAPS: 200.0000 mg | ORAL_CAPSULE | Freq: Every day | ORAL | Status: DC

## 2011-03-19 NOTE — Progress Notes (Signed)
  Subjective:    Patient ID: Amy Burns, female    DOB: 11-11-1940, 70 y.o.   MRN: [id number]  HPI Hypothyroidism, type diabetes, morbid obesity, hypertension, osteoarthritis. Medications reviewed. Recent addition Lasix 20 mg daily. Some continued weight loss.  Down 8 pounds from last visit. Less leg edema. No orthostasis. Electrolytes 3 months ago stable  Needs refill thyroid medication. TSH normal February of 2012. Compliant with medications but poorly compliant with diet.  Hypertension treated with lisinopril 40 mg daily, furosemide 20 mg daily, and Bystolic 5 mg daily.  Past Medical History  Diagnosis Date  . HYPOTHYROIDISM 12/11/2008  . 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 HYPERTENSION 12/11/2008  . OSTEOARTHRITIS 12/11/2008  . ALOPECIA 12/11/2008  . Cancer     breast   Past Surgical History  Procedure Date  . Thyroidectomy, partial 2002  . Breast surgery 2007    cancer  . Knee surgery 2009    left    reports that she has never smoked. She does not have any smokeless tobacco history on file. Her alcohol and drug histories not on file. family history includes Cancer in her maternal aunt and Heart disease (age of onset:56) in her father. Allergies  Allergen Reactions  . Meloxicam     REACTION: itiching      Review of Systems  Constitutional: Negative for fatigue.  Eyes: Negative for visual disturbance.  Respiratory: Negative for cough, chest tightness, shortness of breath and wheezing.   Cardiovascular: Negative for chest pain, palpitations and leg swelling.  Neurological: Negative for dizziness, seizures, syncope, weakness, light-headedness and headaches.       Objective:   Physical Exam  Constitutional: She appears well-developed and well-nourished.  Neck: Neck supple.  Cardiovascular: Normal rate and regular rhythm.   Pulmonary/Chest: Effort normal and breath sounds normal. No respiratory distress. She has no wheezes. She has no rales.  Musculoskeletal:       Trace nonpitting  edema legs bilaterally. No skin breakdown  Psychiatric: She has a normal mood and affect. Her behavior is normal.          Assessment & Plan:  #1 hypertension stable at goal. Continue current medications. Further samples of beta blocker given #2 hypothyroidism refill medication for one year #3 type 2 diabetes. Reassess A1c today. We have discussed at length importance of weight loss. Routine followup 4 months

## 2011-03-20 ENCOUNTER — Other Ambulatory Visit: Payer: Self-pay | Admitting: Family Medicine

## 2011-03-22 LAB — HEMOGLOBIN A1C: Hgb A1c MFr Bld: 6.8 % — ABNORMAL HIGH (ref 4.6–6.5)

## 2011-03-23 NOTE — Progress Notes (Signed)
Quick Note:  Pt informed. Pt continues to experience back pain and is questioning if she can take a newly advertised Bayer aspirin for back pain. Currently she takes Celebrex. ______

## 2011-03-24 NOTE — Progress Notes (Signed)
Quick Note:  Pt informed ______ 

## 2011-05-29 ENCOUNTER — Other Ambulatory Visit: Payer: Self-pay | Admitting: Family Medicine

## 2011-05-31 LAB — POCT I-STAT GLUCOSE
Glucose, Bld: 140 — ABNORMAL HIGH
Operator id: 194801

## 2011-06-07 LAB — BASIC METABOLIC PANEL
BUN: 23
BUN: 36 — ABNORMAL HIGH
CO2: 27
CO2: 28
Calcium: 8.2 — ABNORMAL LOW
Calcium: 9.6
Chloride: 104
Creatinine, Ser: 0.75
Creatinine, Ser: 1.84 — ABNORMAL HIGH
GFR calc Af Amer: 33 — ABNORMAL LOW
GFR calc non Af Amer: 29 — ABNORMAL LOW
Glucose, Bld: 151 — ABNORMAL HIGH
Glucose, Bld: 167 — ABNORMAL HIGH
Potassium: 5.2 — ABNORMAL HIGH

## 2011-06-07 LAB — GLUCOSE, CAPILLARY
Glucose-Capillary: 105 — ABNORMAL HIGH
Glucose-Capillary: 117 — ABNORMAL HIGH
Glucose-Capillary: 122 — ABNORMAL HIGH
Glucose-Capillary: 126 — ABNORMAL HIGH
Glucose-Capillary: 132 — ABNORMAL HIGH
Glucose-Capillary: 134 — ABNORMAL HIGH
Glucose-Capillary: 149 — ABNORMAL HIGH
Glucose-Capillary: 157 — ABNORMAL HIGH
Glucose-Capillary: 191 — ABNORMAL HIGH

## 2011-06-07 LAB — PROTIME-INR
INR: 1.1
INR: 1.3
INR: 1.6 — ABNORMAL HIGH
Prothrombin Time: 14.5
Prothrombin Time: 17.1 — ABNORMAL HIGH
Prothrombin Time: 19.9 — ABNORMAL HIGH
Prothrombin Time: 20.9 — ABNORMAL HIGH

## 2011-06-07 LAB — CBC
HCT: 33.5 — ABNORMAL LOW
Hemoglobin: 12.9
Hemoglobin: 8.8 — ABNORMAL LOW
MCHC: 33.4
MCHC: 33.7
MCHC: 34.7
MCV: 93.7
MCV: 95.7
Platelets: 142 — ABNORMAL LOW
Platelets: 151
RBC: 2.72 — ABNORMAL LOW
RBC: 2.8 — ABNORMAL LOW
RDW: 14.5
RDW: 14.7
WBC: 6.5
WBC: 7.2

## 2011-06-07 LAB — TYPE AND SCREEN
ABO/RH(D): A POS
Antibody Screen: NEGATIVE

## 2011-06-10 LAB — POCT I-STAT, CHEM 8
Creatinine, Ser: 0.8 mg/dL (ref 0.4–1.2)
HCT: 32 % — ABNORMAL LOW (ref 36.0–46.0)
Hemoglobin: 10.9 g/dL — ABNORMAL LOW (ref 12.0–15.0)
Potassium: 3.3 mEq/L — ABNORMAL LOW (ref 3.5–5.1)
Sodium: 143 mEq/L (ref 135–145)
TCO2: 29 mmol/L (ref 0–100)

## 2011-06-10 LAB — DIFFERENTIAL
Basophils Absolute: 0.1 10*3/uL (ref 0.0–0.1)
Eosinophils Relative: 2 % (ref 0–5)
Lymphocytes Relative: 19 % (ref 12–46)
Lymphs Abs: 0.8 10*3/uL (ref 0.7–4.0)
Monocytes Relative: 8 % (ref 3–12)
Neutrophils Relative %: 69 % (ref 43–77)

## 2011-06-10 LAB — URINALYSIS, ROUTINE W REFLEX MICROSCOPIC
Bilirubin Urine: NEGATIVE
Ketones, ur: NEGATIVE mg/dL
Nitrite: NEGATIVE
Specific Gravity, Urine: 1.019 (ref 1.005–1.030)
Urobilinogen, UA: 1 mg/dL (ref 0.0–1.0)

## 2011-06-10 LAB — URINE CULTURE

## 2011-06-10 LAB — CBC
Platelets: 234 10*3/uL (ref 150–400)
RBC: 3.31 MIL/uL — ABNORMAL LOW (ref 3.87–5.11)
WBC: 4.3 10*3/uL (ref 4.0–10.5)

## 2011-06-10 LAB — POCT CARDIAC MARKERS
Myoglobin, poc: 89.9 ng/mL (ref 12–200)
Troponin i, poc: <0.05 ng/mL (ref 0.00–0.09)

## 2011-06-10 LAB — PROTIME-INR
INR: 1.1 (ref 0.00–1.49)
Prothrombin Time: 14.1 seconds (ref 11.6–15.2)

## 2011-06-10 LAB — URINE MICROSCOPIC-ADD ON

## 2011-07-14 ENCOUNTER — Ambulatory Visit (INDEPENDENT_AMBULATORY_CARE_PROVIDER_SITE_OTHER): Payer: Medicare Other | Admitting: Family Medicine

## 2011-07-14 ENCOUNTER — Encounter: Payer: Self-pay | Admitting: Family Medicine

## 2011-07-14 VITALS — BP 150/72 | Temp 98.3°F

## 2011-07-14 DIAGNOSIS — Z23 Encounter for immunization: Secondary | ICD-10-CM

## 2011-07-14 DIAGNOSIS — M1990 Unspecified osteoarthritis, unspecified site: Secondary | ICD-10-CM

## 2011-07-14 DIAGNOSIS — E119 Type 2 diabetes mellitus without complications: Secondary | ICD-10-CM

## 2011-07-14 DIAGNOSIS — I10 Essential (primary) hypertension: Secondary | ICD-10-CM

## 2011-07-14 MED ORDER — METOPROLOL SUCCINATE ER 50 MG PO TB24: 50.0000 mg | ORAL_TABLET | Freq: Every day | ORAL | Status: DC

## 2011-07-14 NOTE — Progress Notes (Signed)
  Subjective:    Patient ID: Amy Burns, female    DOB: 09-Jan-1941, 70 y.o.   MRN: [id number]  HPI  Medical followup. Patient has obesity, type 2 diabetes, hypothyroidism, hypertension, and osteoarthritis. Not checking blood sugars. Diabetes generally stable. No symptoms of hyperglycemia.  Remains on Glucophage with good compliance. Last A1c 6.5%.  She has some intermittent edema right leg greater than left. Takes Lasix but intermittently. Compliant with lisinopril therapy but has not been taking her beta blocker past 2 weeks. She is concerned for possible skin rash with Bystolic.  She stopped this on her own 2 weeks ago. Rash is slowly fading. He is not checking blood pressure. Denies any headaches, dizziness, or chest pains.  Hypothyroidism. Treated with Synthroid. TSH at goal last visit.  Past Medical History  Diagnosis Date  . HYPOTHYROIDISM 12/11/2008  . 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 HYPERTENSION 12/11/2008  . OSTEOARTHRITIS 12/11/2008  . ALOPECIA 12/11/2008  . Cancer     breast   Past Surgical History  Procedure Date  . Thyroidectomy, partial 2002  . Breast surgery 2007    cancer  . Knee surgery 2009    left    reports that she has never smoked. She does not have any smokeless tobacco history on file. Her alcohol and drug histories not on file. family history includes Cancer in her maternal aunt and Heart disease (age of onset:56) in her father. Allergies  Allergen Reactions  . Meloxicam     REACTION: itiching     Review of Systems  Constitutional: Negative for fatigue.  Eyes: Negative for visual disturbance.  Respiratory: Negative for cough, chest tightness, shortness of breath and wheezing.   Cardiovascular: Negative for chest pain, palpitations and leg swelling.  Musculoskeletal: Positive for arthralgias.  Skin: Negative for rash.  Neurological: Negative for dizziness, seizures, syncope, weakness, light-headedness and headaches.       Objective:   Physical Exam    Constitutional: She appears well-developed and well-nourished.  HENT:  Mouth/Throat: Oropharynx is clear and moist.  Neck: Neck supple.  Cardiovascular: Normal rate and regular rhythm.   Pulmonary/Chest: Effort normal and breath sounds normal. No respiratory distress. She has no wheezes. She has no rales.  Musculoskeletal:       Patient has some mild edema of both ankles and lower legs. She has no warmth or erythema the right ankle but does have some pain with dorsiflexion and plantar flexion. Good distal foot pulses  Lymphadenopathy:    She has no cervical adenopathy.          Assessment & Plan:  #1 hypertension. Suboptimal control. Add Toprol-XL 50 mg daily  #2 reported skin rash possibly related to Bystolic.  Change of blood pressure medication as above #3 type 2 diabetes. History of good control. Reassess A1c at followup in 3 months  #4 hypothyroidism. Continue current medication  #5 osteoarthritis right ankle. Patient requesting steroid injection which has helped in the past. Discussed risks and benefits including risk of bruising and infection. Patient consented. Prepped right ankle with Betadine. Using 1 inch 23-gauge needle injected 1 cc of plain Xylocaine and 40 mg Depo-Medrol into intraarticular space.  patient tolerated well.

## 2011-07-22 ENCOUNTER — Other Ambulatory Visit: Payer: Self-pay | Admitting: Oncology

## 2011-07-22 ENCOUNTER — Ambulatory Visit (HOSPITAL_BASED_OUTPATIENT_CLINIC_OR_DEPARTMENT_OTHER): Payer: Medicare Other | Admitting: Oncology

## 2011-07-22 ENCOUNTER — Encounter: Payer: Self-pay | Admitting: *Deleted

## 2011-07-22 ENCOUNTER — Other Ambulatory Visit (HOSPITAL_BASED_OUTPATIENT_CLINIC_OR_DEPARTMENT_OTHER): Payer: Medicare Other

## 2011-07-22 VITALS — BP 158/74 | HR 84 | Temp 98.4°F | Ht 62.0 in | Wt 301.5 lb

## 2011-07-22 DIAGNOSIS — Z170 Estrogen receptor positive status [ER+]: Secondary | ICD-10-CM

## 2011-07-22 DIAGNOSIS — C50119 Malignant neoplasm of central portion of unspecified female breast: Secondary | ICD-10-CM

## 2011-07-22 DIAGNOSIS — C50919 Malignant neoplasm of unspecified site of unspecified female breast: Secondary | ICD-10-CM

## 2011-07-22 DIAGNOSIS — E119 Type 2 diabetes mellitus without complications: Secondary | ICD-10-CM

## 2011-07-22 DIAGNOSIS — R0602 Shortness of breath: Secondary | ICD-10-CM

## 2011-07-22 LAB — COMPREHENSIVE METABOLIC PANEL
ALT: 14 U/L (ref 0–35)
BUN: 19 mg/dL (ref 6–23)
CO2: 28 mEq/L (ref 19–32)
Calcium: 9.6 mg/dL (ref 8.4–10.5)
Chloride: 105 mEq/L (ref 96–112)
Creatinine, Ser: 0.85 mg/dL (ref 0.50–1.10)
Glucose, Bld: 95 mg/dL (ref 70–99)

## 2011-07-22 LAB — CBC WITH DIFFERENTIAL/PLATELET
Basophils Absolute: 0 10*3/uL (ref 0.0–0.1)
HCT: 38.6 % (ref 34.8–46.6)
HGB: 13.1 g/dL (ref 11.6–15.9)
MONO#: 0.3 10*3/uL (ref 0.1–0.9)
NEUT%: 65.6 % (ref 38.4–76.8)
WBC: 4.8 10*3/uL (ref 3.9–10.3)
lymph#: 1.2 10*3/uL (ref 0.9–3.3)

## 2011-07-22 NOTE — Progress Notes (Signed)
ID: Amy Burns   Interval History:  Amy Burns returns today with her husband Amy Burns for followup of her breast cancer. The Endo history is generally unremarkable. She is not exercising regularly and when asked what she does for exercise she says "I go walk to the fridge". She and Amy Burns enjoy going to [place], [place], where there is a bakery our on by [place] folks they in July.  ROS:  Her right knee and right ankle are bothering her she is ready had a left total knee replacement and she really doesn't want to have to go through another one but she is thinking about it. She is short of breath "because of my weight". Occasionally she has feet swelling. She is concerned about her diabetes. Otherwise a detailed review of systems was noncontributory and certainly she gives me no indication of symptoms or signs that might point to disease recurrence.  Medications: I have reviewed the patient's current medications.   Current Outpatient Prescriptions  Medication Sig Dispense Refill  . aspirin 81 MG tablet Take 81 mg by mouth daily.        . Biotin 1000 MCG tablet Take 1,000 mcg by mouth daily.        . celecoxib (CELEBREX) 200 MG capsule Take 1 capsule (200 mg total) by mouth daily.  30 capsule  6  . furosemide (LASIX) 20 MG tablet Take 1 tablet (20 mg total) by mouth daily.  30 tablet  11  . levothyroxine (SYNTHROID) 125 MCG tablet Take 1 tablet (125 mcg total) by mouth daily.  90 tablet  2  . lisinopril (PRINIVIL,ZESTRIL) 40 MG tablet TAKE 1 TABLET BY MOUTH EVERY DAY  30 tablet  11  . metFORMIN (GLUCOPHAGE) 1000 MG tablet TAKE 1 TABLET BY MOUTH TWICE A DAY  60 tablet  11  . metoprolol (TOPROL XL) 50 MG 24 hr tablet Take 1 tablet (50 mg total) by mouth daily.  30 tablet  6     Objective: Vital signs in last 24 hours: BP 158/74  Pulse 84  Temp 98.4 F (36.9 C)  Ht 5\' 2"  (1.575 m)  Wt 301 lb 8 oz (136.76 kg)  BMI 55.15 kg/m2   Physical Exam:    Sclerae unicteric  Oropharynx clear  No  peripheral adenopathy  Lungs clear -- no rales or rhonchi  Heart regular rate and rhythm  Abdomen benign  MSK no focal spinal tenderness, no peripheral edema  Neuro nonfocal  Breast exam: Right breast no suspicious masses left breast status post lumpectomy no evidence of local recurrence  Lab Results:   CMP  Lab Results  Component Value Date   GLUCOSE 104* 03/19/2011   CHOL 151 12/11/2008   TRIG 64.0 12/11/2008   HDL 51.90 12/11/2008   LDLCALC 86 12/11/2008   ALT 13 07/24/2010   AST 25 07/24/2010   NA 141 03/19/2011   K 4.4 03/19/2011   CL 107 03/19/2011   CREATININE 0.8 03/19/2011   BUN 28* 03/19/2011   CO2 27 03/19/2011   TSH 1.12 11/04/2010   INR 1.1 08/07/2008   HGBA1C 6.8* 03/19/2011   MICROALBUR 4.3* 11/05/2009     Lab Results  Component Value Date   WBC 4.8 07/22/2011   HGB 13.1 07/22/2011   HCT 38.6 07/22/2011   MCV 95.3 07/22/2011   PLT 153 07/22/2011        Studies/Results: Mammography in May of 2012 was unremarkable  Assessment:  70 year old [name] woman status post left lumpectomy and sentinel lymph  node biopsy September of 2007 for a T1C N0 (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   Plan:  She needs to continue the Arimidex until February of next year and at that point she will discontinue it. She will see Dr. Carolynne Edouard one more time, next spring, and after that she can "get out of the cancer business". I am not making a return appointment for her here though of course we will be glad to see her at her primary physician's discretion. She knows to call with any questions or problems she thinks may relate to her breast cancer history.  Rasmus Preusser C 07/22/2011

## 2011-07-23 ENCOUNTER — Ambulatory Visit: Payer: Medicare Other | Admitting: Family Medicine

## 2011-07-23 ENCOUNTER — Other Ambulatory Visit: Payer: Self-pay | Admitting: Oncology

## 2011-09-09 ENCOUNTER — Telehealth: Payer: Self-pay | Admitting: Family Medicine

## 2011-09-09 NOTE — Telephone Encounter (Signed)
Pt was given generic toprol 25 mg in Nov and Dec instead 50mg . Pt was given 50mg  today per kim pharmacist. Pharmacist just want MD to be aware of the error.

## 2011-09-09 NOTE — Telephone Encounter (Signed)
noted 

## 2011-09-22 ENCOUNTER — Telehealth: Payer: Self-pay | Admitting: Family Medicine

## 2011-09-22 NOTE — Telephone Encounter (Signed)
Yes. Refill enough through then.

## 2011-09-22 NOTE — Telephone Encounter (Signed)
Pt is running out of her Anastrozole. She states that she was supposed to take it for 55yrs. That will be up at the end of February, but she only has 2 left. Can enough be called in to get her through Feb? She uses on [facility] [name].

## 2011-09-23 MED ORDER — ANASTROZOLE 1 MG PO TABS: 1.0000 mg | ORAL_TABLET | Freq: Every day | ORAL | Status: DC

## 2011-09-23 NOTE — Telephone Encounter (Signed)
Rx called in, pt informed on home VM 

## 2011-10-19 ENCOUNTER — Encounter: Payer: Self-pay | Admitting: Family Medicine

## 2011-10-19 ENCOUNTER — Ambulatory Visit (INDEPENDENT_AMBULATORY_CARE_PROVIDER_SITE_OTHER): Payer: Medicare Other | Admitting: Family Medicine

## 2011-10-19 DIAGNOSIS — I10 Essential (primary) hypertension: Secondary | ICD-10-CM

## 2011-10-19 DIAGNOSIS — E039 Hypothyroidism, unspecified: Secondary | ICD-10-CM

## 2011-10-19 DIAGNOSIS — E119 Type 2 diabetes mellitus without complications: Secondary | ICD-10-CM

## 2011-10-19 DIAGNOSIS — M1990 Unspecified osteoarthritis, unspecified site: Secondary | ICD-10-CM

## 2011-10-19 LAB — HEMOGLOBIN A1C: Hgb A1c MFr Bld: 6.6 % — ABNORMAL HIGH (ref 4.6–6.5)

## 2011-10-19 MED ORDER — CELECOXIB 200 MG PO CAPS: 200.0000 mg | ORAL_CAPSULE | Freq: Every day | ORAL | Status: DC

## 2011-10-19 NOTE — Progress Notes (Signed)
  Subjective:    Patient ID: Amy Burns, female    DOB: April 20, 1941, 71 y.o.   MRN: [id number]  HPI  Medical followup. She has history of morbid obesity, type 2 diabetes, osteoarthritis, hypertension, and remote history of breast cancer. She takes hormonal therapy until the end of this month and is released from their care. Recent followup with oncologist.  History of poor compliance history. No exercise. Recent addition of metoprolol XL 50 mg to her regimen. Not monitoring blood pressure. No headaches or dizziness. No chest pains. Chronic peripheral edema unchanged. Takes Lasix as needed. Compliant with other medications.  Severe osteoarthritis in knees and ankles. Takes Celebrex 200 mg daily.  Hypothyroidism treated with levothyroxine. Needs followup labs. She's had some chronic alopecia which is unchanged. No constipation  Diabetes has been stable. Not monitoring blood sugars. No symptoms of hyperglycemia.  Past Medical History  Diagnosis Date  . HYPOTHYROIDISM 12/11/2008  . 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 HYPERTENSION 12/11/2008  . OSTEOARTHRITIS 12/11/2008  . ALOPECIA 12/11/2008  . Cancer     breast   Past Surgical History  Procedure Date  . Thyroidectomy, partial 2002  . Breast surgery 2007    cancer  . Knee surgery 2009    left    reports that she has never smoked. She does not have any smokeless tobacco history on file. Her alcohol and drug histories not on file. family history includes Cancer in her maternal aunt and Heart disease (age of onset:56) in her father. Allergies  Allergen Reactions  . Meloxicam     REACTION: itiching      Review of Systems  Constitutional: Negative for fever, chills and appetite change.  Respiratory: Negative for cough.   Cardiovascular: Positive for leg swelling. Negative for chest pain and palpitations.  Gastrointestinal: Negative for abdominal pain.  Genitourinary: Negative for dysuria.  Neurological: Negative for dizziness and syncope.         Objective:   Physical Exam  Constitutional: She appears well-developed and well-nourished. No distress.  HENT:  Mouth/Throat: Oropharynx is clear and moist.  Cardiovascular: Normal rate and regular rhythm.   Pulmonary/Chest: Effort normal and breath sounds normal. No respiratory distress. She has no wheezes. She has no rales.  Musculoskeletal: She exhibits edema.       Feet reveal no skin lesions. Good distal foot pulses. Good capillary refill. No calluses. Normal sensation with monofilament testing   Neurological: She is alert.  Psychiatric: She has a normal mood and affect. Her behavior is normal.          Assessment & Plan:  #1 hypertension improved with recent addition of beta blocker. Continue current medications #2 morbid obesity. Very poor motivation for weight and lifestyle change. We discussed strategies again today  #3 hypothyroidism recheck TSH  #4 osteoarthritis related to her obesity. Refill Celebrex  #5 type 2 diabetes. Recheck A1c. Recommend she continue yearly eye exams

## 2011-10-20 LAB — TSH: TSH: 0.81 u[IU]/mL (ref 0.35–5.50)

## 2011-10-21 NOTE — Progress Notes (Signed)
Quick Note:  Pt informed ______ 

## 2011-11-27 ENCOUNTER — Other Ambulatory Visit: Payer: Self-pay | Admitting: Family Medicine

## 2012-02-16 ENCOUNTER — Encounter: Payer: Self-pay | Admitting: Family Medicine

## 2012-02-16 ENCOUNTER — Ambulatory Visit (INDEPENDENT_AMBULATORY_CARE_PROVIDER_SITE_OTHER): Payer: Medicare Other | Admitting: Family Medicine

## 2012-02-16 VITALS — BP 140/60 | HR 76 | Temp 98.6°F | Wt 302.0 lb

## 2012-02-16 DIAGNOSIS — M1990 Unspecified osteoarthritis, unspecified site: Secondary | ICD-10-CM

## 2012-02-16 DIAGNOSIS — I10 Essential (primary) hypertension: Secondary | ICD-10-CM

## 2012-02-16 DIAGNOSIS — Z23 Encounter for immunization: Secondary | ICD-10-CM

## 2012-02-16 DIAGNOSIS — E119 Type 2 diabetes mellitus without complications: Secondary | ICD-10-CM

## 2012-02-16 DIAGNOSIS — E039 Hypothyroidism, unspecified: Secondary | ICD-10-CM

## 2012-02-16 LAB — HEMOGLOBIN A1C: Hgb A1c MFr Bld: 6.2 % (ref 4.6–6.5)

## 2012-02-16 NOTE — Progress Notes (Signed)
  Subjective:    Patient ID: Amy Burns, female    DOB: [birth year], 71 y.o.   MRN: [id number]  HPI  Medical followup. Patient has history of morbid obesity, type 2 diabetes, hypertension, osteoarthritis, and hypothyroidism. She's been very poorly compliant with medications. She recently stopped metoprolol as she was convinced caused depressed mood. She takes furosemide only on occasion. She does take lisinopril 40 mg daily. Also compliant with thyroid medication. Recent TSH normal. Not monitoring blood pressure of known. A1c has been stable with most recent 6.8%. Not monitoring regular blood sugars regularly.  Recent tooth extraction. Started on clindamycin Friday. Occasional loose stools but no severe diarrhea.  Past Medical History  Diagnosis Date  . HYPOTHYROIDISM 12/11/2008  . 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 HYPERTENSION 12/11/2008  . OSTEOARTHRITIS 12/11/2008  . ALOPECIA 12/11/2008  . Cancer     breast   Past Surgical History  Procedure Date  . Thyroidectomy, partial 2002  . Breast surgery 2007    cancer  . Knee surgery 2009    left    reports that she has never smoked. She does not have any smokeless tobacco history on file. Her alcohol and drug histories not on file. family history includes Cancer in her maternal aunt and Heart disease (age of onset:56) in her father. Allergies  Allergen Reactions  . Meloxicam     REACTION: itiching      Review of Systems  Constitutional: Negative for fever, activity change, appetite change and unexpected weight change.  Respiratory: Negative for cough and shortness of breath.   Cardiovascular: Positive for leg swelling (Chronic and unchanged). Negative for chest pain.  Gastrointestinal: Negative for abdominal pain.  Genitourinary: Negative for dysuria.  Neurological: Negative for dizziness.       Objective:   Physical Exam  Constitutional: She appears well-developed and well-nourished. No distress.  HENT:  Mouth/Throat: Oropharynx is clear and  moist.  Neck: Neck supple. No thyromegaly present.  Cardiovascular: Normal rate and regular rhythm.   Pulmonary/Chest: Effort normal and breath sounds normal. No respiratory distress. She has no wheezes. She has no rales.  Musculoskeletal: She exhibits edema.       Trace edema ankles feet and legs bilaterally. No lesions          Assessment & Plan:  #1 type 2 diabetes. Recheck A1c. If stable at this point consider every 6 months. Continue weight loss efforts #2 hypertension. Marginal control.  Needs to lose weight. Patient recently intolerant of metoprolol-increased depressive symptoms #3 hypothyroidism recently at goal by labs #4 osteoarthritis mostly involving ankles and knees. She is encouraged followup with orthopedist. She is considering right knee replacement  #5 health maintenance. No history of documented Pneumovax. Patient consents today

## 2012-02-17 NOTE — Progress Notes (Signed)
Quick Note:  Pt informed on home VM ______ 

## 2012-03-11 ENCOUNTER — Other Ambulatory Visit: Payer: Self-pay | Admitting: Family Medicine

## 2012-05-19 ENCOUNTER — Other Ambulatory Visit: Payer: Self-pay | Admitting: Family Medicine

## 2012-05-26 ENCOUNTER — Other Ambulatory Visit: Payer: Self-pay | Admitting: Family Medicine

## 2012-08-17 ENCOUNTER — Encounter: Payer: Self-pay | Admitting: Family Medicine

## 2012-08-17 ENCOUNTER — Ambulatory Visit (INDEPENDENT_AMBULATORY_CARE_PROVIDER_SITE_OTHER): Payer: Medicare Other | Admitting: Family Medicine

## 2012-08-17 VITALS — BP 150/70 | Temp 98.6°F | Wt 302.5 lb

## 2012-08-17 DIAGNOSIS — R600 Localized edema: Secondary | ICD-10-CM

## 2012-08-17 DIAGNOSIS — E039 Hypothyroidism, unspecified: Secondary | ICD-10-CM

## 2012-08-17 DIAGNOSIS — E119 Type 2 diabetes mellitus without complications: Secondary | ICD-10-CM

## 2012-08-17 DIAGNOSIS — R609 Edema, unspecified: Secondary | ICD-10-CM

## 2012-08-17 DIAGNOSIS — I10 Essential (primary) hypertension: Secondary | ICD-10-CM

## 2012-08-17 DIAGNOSIS — Z23 Encounter for immunization: Secondary | ICD-10-CM

## 2012-08-17 MED ORDER — FUROSEMIDE 20 MG PO TABS: 20.0000 mg | ORAL_TABLET | Freq: Every day | ORAL | Status: DC

## 2012-08-17 NOTE — Progress Notes (Signed)
  Subjective:    Patient ID: Amy Burns, female    DOB: Jan 30, 1941, 71 y.o.   MRN: [id number]  HPI  Medical followup. History of morbid obesity, type 2 diabetes, hypertension, hypothyroidism. Her lipids have been fairly stable. She has some ongoing generalized alopecia and is worried that this may be related to some medications including Celebrex.  Not monitoring blood sugars. No symptoms of hyperglycemia. Blood sugars have been consistently well controlled.  Hypothyroidism. Needs followup labs. Compliant with medications. She has some chronic leg edema and has not been taking her furosemide recently. Recent increase in edema. Not monitoring blood pressures. Poor compliance with diet  Osteoarthritis mostly involving ankles and knees. Very poor compliance with diet and she gets very little activity.  Past Medical History  Diagnosis Date  . HYPOTHYROIDISM 12/11/2008  . 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 HYPERTENSION 12/11/2008  . OSTEOARTHRITIS 12/11/2008  . ALOPECIA 12/11/2008  . Cancer     breast   Past Surgical History  Procedure Date  . Thyroidectomy, partial 2002  . Breast surgery 2007    cancer  . Knee surgery 2009    left    reports that she has never smoked. She does not have any smokeless tobacco history on file. Her alcohol and drug histories not on file. family history includes Cancer in her maternal aunt and Heart disease (age of onset:56) in her father. Allergies  Allergen Reactions  . Meloxicam     REACTION: itiching      Review of Systems  Constitutional: Positive for fatigue.  Eyes: Negative for visual disturbance.  Respiratory: Negative for cough, chest tightness, shortness of breath and wheezing.   Cardiovascular: Positive for leg swelling. Negative for chest pain and palpitations.  Neurological: Negative for dizziness, seizures, syncope, weakness, light-headedness and headaches.       Objective:   Physical Exam  Constitutional: She appears well-developed and  well-nourished.  HENT:  Mouth/Throat: Oropharynx is clear and moist.  Cardiovascular: Normal rate and regular rhythm.   Pulmonary/Chest: Effort normal and breath sounds normal. No respiratory distress. She has no wheezes. She has no rales.  Musculoskeletal: She exhibits edema.       Trace to 1+ edema legs bilaterally. No ulcerations.  Psychiatric: She has a normal mood and affect. Her behavior is normal.          Assessment & Plan:  #1 type 2 diabetes. History of excellent control. Recheck A1c. Check lipid panel #2 hypothyroidism. Recheck TSH  #3 history of chronic leg edema. No history of CHF. Suspect related to obesity and venous stasis. She has not been compliant with compression stockings previously. Start back furosemide 20 mg daily. #4 hypertension. Poorly controlled by today's readings. Start back furosemide as above and monitor blood pressure closely next 2 weeks #5 prevention. Flu vaccine given. Pneumovax up-to-date

## 2012-08-18 LAB — HEPATIC FUNCTION PANEL
ALT: 19 U/L (ref 0–35)
AST: 32 U/L (ref 0–37)
Albumin: 3.5 g/dL (ref 3.5–5.2)
Total Protein: 7.1 g/dL (ref 6.0–8.3)

## 2012-08-18 LAB — HEMOGLOBIN A1C: Hgb A1c MFr Bld: 6.4 % (ref 4.6–6.5)

## 2012-08-18 LAB — TSH: TSH: 0.39 u[IU]/mL (ref 0.35–5.50)

## 2012-08-18 LAB — BASIC METABOLIC PANEL
Calcium: 9.1 mg/dL (ref 8.4–10.5)
GFR: 70.74 mL/min (ref >60.00)
Glucose, Bld: 126 mg/dL — ABNORMAL HIGH (ref 70–99)
Sodium: 139 mEq/L (ref 135–145)

## 2012-08-18 LAB — LIPID PANEL: HDL: 74.2 mg/dL (ref >39.00)

## 2012-08-18 NOTE — Progress Notes (Signed)
Quick Note:  Pt informed ______ 

## 2012-09-13 ENCOUNTER — Encounter: Payer: Self-pay | Admitting: *Deleted

## 2012-09-21 ENCOUNTER — Other Ambulatory Visit: Payer: Self-pay | Admitting: Family Medicine

## 2013-01-12 ENCOUNTER — Other Ambulatory Visit: Payer: Self-pay | Admitting: Orthopedic Surgery

## 2013-01-17 ENCOUNTER — Encounter (HOSPITAL_COMMUNITY): Payer: Self-pay | Admitting: Pharmacy Technician

## 2013-01-17 NOTE — H&P (Signed)
NAME:  Amy, Burns NO.:  MEDICAL RECORD NO.:  [id number]  LOCATION:                                FACILITY:  [facility]  PHYSICIAN:  Marlowe Kays, M.D.  DATE OF BIRTH:  31-Oct-1940  DATE OF ADMISSION:  01/30/2013 DATE OF DISCHARGE:                             HISTORY & PHYSICAL   CHIEF COMPLAINT:  Pain in my right knee.  HISTORY OF PRESENT ILLNESS:  This 72 year old white female who has been seen by [place] for continuing problems, concerning her knees.  She underwent a total knee replacement arthroplasty in 2009, has done very well with that knee.  Unfortunately over the years, the right knee now has deteriorated to the point where she could hardly get around.  She must use a cane for ambulation.  X-rays have shown bone-on-bone deformity on the right knee.  After much consideration including the risks and benefits of surgery, decided to go ahead with total knee replacement arthroplasty of the right knee.  We noticed on the patient's ambulation to the examination room that she became short of breath.  I questioned her on this and she says that any kind of walking increases her respiration.  She weighs 301 pounds and is only 5 feet 2 inches and this obviously is weighing on her day-to-day activities.  PAST MEDICAL HISTORY:  This patient's physician is Dr. Evelena Burns.  MEDICATIONS:  Her current medications are 81 mg of aspirin daily, Tylenol daily, biotin daily, lisinopril daily, metformin daily, and levothyroxine daily.  ALLERGIES:  She has no medical allergies.  PAST SURGICAL HISTORY:  Partial thyroidectomy done in 2002, breast cancer surgically corrected on the left.  She describes some sort of blood clots after the thyroidectomy.  She had a brief hospitalization. She is on no anticoagulants other than the aspirin.  Left total knee replacement and arthroplasty in October 2009.  FAMILY HISTORY:  Positive for heart disease in the father as well as  in the mother.  SOCIAL HISTORY:  The patient is married and Burns had intake of alcohol or tobacco products.  She is in 1 level home with 1 to 1-1/2 step entering into the home.  REVIEW OF SYSTEMS:  CNS:  No seizures or paralysis.  No numbness or double vision.  RESPIRATORY:  The patient has orthopnea.  She thinks that she has sleep apnea.  She has Burns been treated for nor diagnosed for it.  She has difficulty climbing stairs and has shortness of breath with only short distances.  No hemoptysis.  CARDIOVASCULAR:  She is being treated for hypertension.  No chest pain, no angina. GASTROINTESTINAL:  No nausea, vomiting, melena, or bloody stool. Occasional constipation.  GENITOURINARY:  No discharge, dysuria, hematuria.  MUSCULOSKELETAL:  Primarily in her right knee, but she also has back pain and morning stiffness.  PHYSICAL EXAMINATION:  GENERAL:  Alert and cooperative, in no acute distress, and no shortness of breath while seated, 72 year old white female. VITAL SIGNS:  Again, she weighs 301 pounds, 5 feet 2 inches tall.  Blood pressure is 159/81, pulse 86, respirations 18. HEENT:  Normocephalic.  PERRLA.  Oropharynx is clear.  She does  have an upper bridge. NECK:  Supple.  No lymphadenopathy. CHEST:  Clear to auscultation.  No wheezing.  No rhonchi. HEART:  Regular rate and rhythm.  No murmurs are heard. ABDOMEN:  Large, pendulous, soft, nontender.  Liver, spleen not felt. GENITALIA, RECTAL, PELVIC, BREASTS:  Not done.  Not pertinent for present illness. EXTREMITIES:  Crepitus on range of motion of the right knee.  Tenderness along the medial border.  ADMITTING DIAGNOSES: 1. Osteoarthritis of the right knee. 2. Hypothyroidism. 3. Hypertension. 4. Diabetes. 5. Obesity.  PLAN:  The patient will undergo total knee replacement arthroplasty. When she had her last knee, there was no complication after surgery and she began ambulating immediately.  We told her to go ahead.  I  told her to go to talk to Dr. Caryl Burns, her family physician, to get a medical clearance, particularly pulmonary before this surgery.  I told her to call him this afternoon and try to get those results as soon as possible and to please ask his office to fax them to the [facility] preadmitting.  We have also asked her to please fax [place] a copy.     Angles Trevizo L. Cherlynn June.   ______________________________ Marlowe Kays, M.D.    DLU/MEDQ  D:  01/16/2013  T:  01/17/2013  Job:  [id number]

## 2013-01-19 NOTE — Patient Instructions (Addendum)
20 Joeanna DERRA SHARTZER  01/19/2013   Your procedure is scheduled on: 01/30/13  Report to [facility] at 1045 AM.  Call this number if you have problems the morning of surgery [phone]: [phone]   Remember:   Do not eat food After Midnight, clear liquids from midnight until 0715 AM on 01/30/13 then nothing.      Take these medicines the morning of surgery with A SIP OF WATER: levothyroxine   Do not wear jewelry, make-up or nail polish.  Do not wear lotions, powders, or perfumes. You may wear deodorant.  Do not shave 48 hours prior to surgery. Men may shave face and neck.  Do not bring valuables to the hospital.  Contacts, dentures or bridgework may not be worn into surgery.  Leave suitcase in the car. After surgery it may be brought to your room.  For patients admitted to the hospital, checkout time is 11:00 AM the day of discharge.    Please read over the following fact sheets that you were given: MRSA Information, clear liquids fact sheet, blood fact sheet Birdie Sons, RN  pre op nurse call if needed [phone]    FAILURE TO FOLLOW THESE INSTRUCTIONS MAY RESULT IN CANCELLATION OF YOUR SURGERY   Patient Signature: ___________________________________________

## 2013-01-22 ENCOUNTER — Encounter (HOSPITAL_COMMUNITY)
Admission: RE | Admit: 2013-01-22 | Discharge: 2013-01-22 | Disposition: A | Discharge status: Home / Self Care | Payer: Medicare Other | Source: Ambulatory Visit | Attending: Orthopedic Surgery | Admitting: Orthopedic Surgery

## 2013-01-22 ENCOUNTER — Encounter (HOSPITAL_COMMUNITY): Payer: Self-pay

## 2013-01-22 ENCOUNTER — Ambulatory Visit (HOSPITAL_COMMUNITY)
Admission: RE | Admit: 2013-01-22 | Discharge: 2013-01-22 | Disposition: A | Discharge status: Home / Self Care | Payer: Medicare Other | Source: Ambulatory Visit | Attending: Orthopedic Surgery | Admitting: Orthopedic Surgery

## 2013-01-22 DIAGNOSIS — Z01810 Encounter for preprocedural cardiovascular examination: Secondary | ICD-10-CM | POA: Insufficient documentation

## 2013-01-22 DIAGNOSIS — I517 Cardiomegaly: Secondary | ICD-10-CM | POA: Insufficient documentation

## 2013-01-22 DIAGNOSIS — I10 Essential (primary) hypertension: Secondary | ICD-10-CM | POA: Insufficient documentation

## 2013-01-22 DIAGNOSIS — E119 Type 2 diabetes mellitus without complications: Secondary | ICD-10-CM | POA: Insufficient documentation

## 2013-01-22 DIAGNOSIS — Z01818 Encounter for other preprocedural examination: Secondary | ICD-10-CM | POA: Insufficient documentation

## 2013-01-22 HISTORY — DX: Shortness of breath: R06.02

## 2013-01-22 HISTORY — DX: Personal history of other infectious and parasitic diseases: Z86.19

## 2013-01-22 HISTORY — DX: Other pulmonary embolism without acute cor pulmonale: I26.99

## 2013-01-22 HISTORY — DX: Headache: R51

## 2013-01-22 HISTORY — DX: Orthopnea: R06.01

## 2013-01-22 HISTORY — DX: Sleep apnea, unspecified: G47.30

## 2013-01-22 HISTORY — DX: Measles without complication: B05.9

## 2013-01-22 HISTORY — DX: Adverse effect of unspecified anesthetic, initial encounter: T41.45XA

## 2013-01-22 HISTORY — DX: Other complications of anesthesia, initial encounter: T88.59XA

## 2013-01-22 LAB — CBC
Hemoglobin: 12.4 g/dL (ref 12.0–15.0)
MCH: 33.5 pg (ref 26.0–34.0)
Platelets: 163 10*3/uL (ref 150–400)
RBC: 3.7 MIL/uL — ABNORMAL LOW (ref 3.87–5.11)
WBC: 3.2 10*3/uL — ABNORMAL LOW (ref 4.0–10.5)

## 2013-01-22 LAB — BASIC METABOLIC PANEL
Calcium: 9.6 mg/dL (ref 8.4–10.5)
GFR calc Af Amer: 79 mL/min — ABNORMAL LOW (ref >90)
GFR calc non Af Amer: 68 mL/min — ABNORMAL LOW (ref >90)
Glucose, Bld: 213 mg/dL — ABNORMAL HIGH (ref 70–99)
Sodium: 141 mEq/L (ref 135–145)

## 2013-01-22 LAB — PROTIME-INR
INR: 1.02 (ref 0.00–1.49)
Prothrombin Time: 13.3 seconds (ref 11.6–15.2)

## 2013-01-25 ENCOUNTER — Encounter: Payer: Self-pay | Admitting: Family Medicine

## 2013-01-25 ENCOUNTER — Ambulatory Visit (INDEPENDENT_AMBULATORY_CARE_PROVIDER_SITE_OTHER): Payer: Medicare Other | Admitting: Family Medicine

## 2013-01-25 VITALS — BP 138/60 | HR 100 | Temp 98.4°F | Wt 302.0 lb

## 2013-01-25 DIAGNOSIS — E119 Type 2 diabetes mellitus without complications: Secondary | ICD-10-CM

## 2013-01-25 DIAGNOSIS — I10 Essential (primary) hypertension: Secondary | ICD-10-CM

## 2013-01-25 DIAGNOSIS — E039 Hypothyroidism, unspecified: Secondary | ICD-10-CM

## 2013-01-25 MED ORDER — METOPROLOL SUCCINATE ER 25 MG PO TB24: 25.0000 mg | ORAL_TABLET | Freq: Every day | ORAL | Status: DC

## 2013-01-25 NOTE — Patient Instructions (Signed)
Start Toprol XL 25 mg one daily

## 2013-01-25 NOTE — Progress Notes (Signed)
  Subjective:    Patient ID: Amy Burns, female    DOB: 1941/02/24, 72 y.o.   MRN: [id number]  HPI Patient has history of morbid obesity, type 2 diabetes, hypertension, osteoarthritis, and hypothyroidism. She is scheduled for total right knee replacement May 27. She's had previous left total knee replacement 2 years ago and did extremely well. Patient had nuclear stress test 2009 which was unremarkable. She had cardiac catheterization 11/30/2007 with ejection fraction 65% and no significant coronary disease.  Denies any recent chest pains. She has chronic dyspnea with exertion likely related to her obesity and deconditioning. No history of asthma. Medications reviewed. Blood pressure and diabetes generally well-controlled. Last A1c 6.4%. She has some intermittent lower extremity edema takes as needed furosemide 20 mg daily.  Past Medical History  Diagnosis Date  . HYPOTHYROIDISM 12/11/2008  . 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 HYPERTENSION 12/11/2008  . OSTEOARTHRITIS 12/11/2008  . ALOPECIA 12/11/2008  . Shortness of breath   . Pulmonary embolism     bilateral  . Orthopnea   . Measles     as child  . History of chicken pox     as child  . Cancer     left breast  . Sleep apnea     can not wear CPAP  . Headache   . Complication of anesthesia     "woke up after surgery and could not breath"   Past Surgical History  Procedure Laterality Date  . Thyroidectomy, partial  2002  . Breast surgery  2007    cancer  . Knee surgery  2009    left    reports that she has never smoked. She has never used smokeless tobacco. She reports that she does not drink alcohol or use illicit drugs. family history includes Cancer in her maternal aunt and Heart disease (age of onset: 88) in her father. Allergies  Allergen Reactions  . Meloxicam     REACTION: itiching      Review of Systems  Constitutional: Negative for fever and chills.  Respiratory: Negative for cough and wheezing. Shortness of breath: Chronic  with exertion and unchanged.   Cardiovascular: Negative for chest pain.  Gastrointestinal: Negative for abdominal pain.  Endocrine: Negative for polydipsia and polyuria.  Neurological: Negative for dizziness and headaches.       Objective:   Physical Exam  Constitutional: She appears well-developed and well-nourished.  Neck: Neck supple.  Cardiovascular: Normal rate and regular rhythm.   Pulmonary/Chest: Effort normal and breath sounds normal. No respiratory distress. She has no wheezes. She has no rales.  Musculoskeletal: She exhibits edema (trace pitting edema lower legs bilaterally which is chronic).  Skin:  Feet reveal no skin lesions. Good distal foot pulses. Good capillary refill. No calluses. Normal sensation with monofilament testing           Assessment & Plan:  #1 type 2 diabetes. History of excellent control. Recheck A1c. Continue yearly eye exams #2 hypertension. Stable. She does have some baseline tachycardia with pulse around 100. No history of asthma. Add low-dose beta blocker Toprol-XL 25 mg once daily #3 hypothyroidism. TSH was at goal when checked last visit. Continue current medication.

## 2013-01-30 ENCOUNTER — Inpatient Hospital Stay (HOSPITAL_COMMUNITY)
Admission: RE | Admit: 2013-01-30 | Discharge: 2013-02-02 | DRG: 470 | Disposition: A | Discharge status: Skilled Nursing Facility | Payer: Medicare Other | Source: Ambulatory Visit | Attending: Orthopedic Surgery | Admitting: Orthopedic Surgery

## 2013-01-30 ENCOUNTER — Encounter (HOSPITAL_COMMUNITY): Payer: Self-pay | Admitting: Anesthesiology

## 2013-01-30 ENCOUNTER — Encounter (HOSPITAL_COMMUNITY): Payer: Self-pay | Admitting: *Deleted

## 2013-01-30 ENCOUNTER — Encounter (HOSPITAL_COMMUNITY)
Admission: RE | Disposition: A | Discharge status: Skilled Nursing Facility | Payer: Self-pay | Source: Ambulatory Visit | Attending: Orthopedic Surgery

## 2013-01-30 ENCOUNTER — Inpatient Hospital Stay (HOSPITAL_COMMUNITY): Payer: Medicare Other

## 2013-01-30 ENCOUNTER — Inpatient Hospital Stay (HOSPITAL_COMMUNITY): Payer: Medicare Other | Admitting: Anesthesiology

## 2013-01-30 DIAGNOSIS — E039 Hypothyroidism, unspecified: Secondary | ICD-10-CM | POA: Diagnosis present

## 2013-01-30 DIAGNOSIS — D62 Acute posthemorrhagic anemia: Secondary | ICD-10-CM | POA: Diagnosis not present

## 2013-01-30 DIAGNOSIS — Z96659 Presence of unspecified artificial knee joint: Secondary | ICD-10-CM

## 2013-01-30 DIAGNOSIS — I10 Essential (primary) hypertension: Secondary | ICD-10-CM | POA: Diagnosis present

## 2013-01-30 DIAGNOSIS — Z6841 Body Mass Index (BMI) 40.0 and over, adult: Secondary | ICD-10-CM

## 2013-01-30 DIAGNOSIS — M1710 Unilateral primary osteoarthritis, unspecified knee: Principal | ICD-10-CM | POA: Diagnosis present

## 2013-01-30 DIAGNOSIS — E119 Type 2 diabetes mellitus without complications: Secondary | ICD-10-CM | POA: Diagnosis present

## 2013-01-30 HISTORY — PX: TOTAL KNEE ARTHROPLASTY: SHX125

## 2013-01-30 LAB — GLUCOSE, CAPILLARY

## 2013-01-30 LAB — TYPE AND SCREEN: Antibody Screen: NEGATIVE

## 2013-01-30 LAB — HEMOGLOBIN A1C: Hgb A1c MFr Bld: 6.2 % (ref 4.6–6.5)

## 2013-01-30 SURGERY — ARTHROPLASTY, KNEE, TOTAL
Anesthesia: General | Site: Knee | Laterality: Right | Wound class: Clean

## 2013-01-30 MED ORDER — HYDROMORPHONE 0.3 MG/ML IV SOLN
INTRAVENOUS | Status: DC
  Administered 2013-01-30: 0.2 mg via INTRAVENOUS
  Administered 2013-01-31: 0.799 mg via INTRAVENOUS
  Administered 2013-01-31: 0.999 mg via INTRAVENOUS
  Administered 2013-01-31: 1.59 mg via INTRAVENOUS
  Administered 2013-01-31: 1.19 mg via INTRAVENOUS
  Administered 2013-02-01: 0.4 mg via INTRAVENOUS
  Administered 2013-02-01: 1.19 mg via INTRAVENOUS
  Administered 2013-02-02: 0.2 mg via INTRAVENOUS

## 2013-01-30 MED ORDER — SUCCINYLCHOLINE CHLORIDE 20 MG/ML IJ SOLN
INTRAMUSCULAR | Status: DC | PRN
  Administered 2013-01-30: 100 mg via INTRAVENOUS

## 2013-01-30 MED ORDER — LISINOPRIL 20 MG PO TABS
40.0000 mg | ORAL_TABLET | Freq: Every morning | ORAL | Status: DC
  Administered 2013-01-31 – 2013-02-02 (×3): 40 mg via ORAL
  Filled 2013-01-30 (×3): qty 2

## 2013-01-30 MED ORDER — METOCLOPRAMIDE HCL 5 MG/ML IJ SOLN: 5.0000 mg | Freq: Three times a day (TID) | INTRAMUSCULAR | Status: DC | PRN

## 2013-01-30 MED ORDER — RIVAROXABAN 10 MG PO TABS
10.0000 mg | ORAL_TABLET | Freq: Every day | ORAL | Status: DC
Start: 2013-01-31 — End: 2013-02-02
  Administered 2013-01-31 – 2013-02-02 (×3): 10 mg via ORAL
  Filled 2013-01-30 (×4): qty 1

## 2013-01-30 MED ORDER — METHOCARBAMOL 100 MG/ML IJ SOLN
500.0000 mg | Freq: Four times a day (QID) | INTRAVENOUS | Status: DC | PRN
  Filled 2013-01-30: qty 5

## 2013-01-30 MED ORDER — BUPIVACAINE-EPINEPHRINE PF 0.25-1:200000 % IJ SOLN
INTRAMUSCULAR | Status: DC | PRN
  Administered 2013-01-30: 20 mL

## 2013-01-30 MED ORDER — DIPHENHYDRAMINE HCL 50 MG/ML IJ SOLN: 12.5000 mg | Freq: Four times a day (QID) | INTRAMUSCULAR | Status: DC | PRN

## 2013-01-30 MED ORDER — FUROSEMIDE 20 MG PO TABS
20.0000 mg | ORAL_TABLET | Freq: Every day | ORAL | Status: DC | PRN
  Filled 2013-01-30: qty 1

## 2013-01-30 MED ORDER — METOPROLOL SUCCINATE ER 25 MG PO TB24
25.0000 mg | ORAL_TABLET | Freq: Every day | ORAL | Status: DC
  Administered 2013-01-30: 25 mg via ORAL
  Filled 2013-01-30: qty 1

## 2013-01-30 MED ORDER — METFORMIN HCL 500 MG PO TABS
1000.0000 mg | ORAL_TABLET | Freq: Every day | ORAL | Status: DC
  Administered 2013-01-31 – 2013-02-02 (×3): 1000 mg via ORAL
  Filled 2013-01-30 (×4): qty 2

## 2013-01-30 MED ORDER — METOPROLOL SUCCINATE ER 25 MG PO TB24
25.0000 mg | ORAL_TABLET | Freq: Every day | ORAL | Status: DC
  Administered 2013-01-31 – 2013-02-02 (×3): 25 mg via ORAL
  Filled 2013-01-30 (×3): qty 1

## 2013-01-30 MED ORDER — ONDANSETRON HCL 4 MG/2ML IJ SOLN: 4.0000 mg | Freq: Four times a day (QID) | INTRAMUSCULAR | Status: DC | PRN

## 2013-01-30 MED ORDER — METOCLOPRAMIDE HCL 5 MG PO TABS
5.0000 mg | ORAL_TABLET | Freq: Three times a day (TID) | ORAL | Status: DC | PRN
  Filled 2013-01-30: qty 2

## 2013-01-30 MED ORDER — FENTANYL CITRATE 0.05 MG/ML IJ SOLN
INTRAMUSCULAR | Status: DC | PRN
  Administered 2013-01-30: 5 ug via INTRAVENOUS
  Administered 2013-01-30 (×2): 100 ug via INTRAVENOUS

## 2013-01-30 MED ORDER — INSULIN ASPART 100 UNIT/ML ~~LOC~~ SOLN
0.0000 [IU] | Freq: Every day | SUBCUTANEOUS | Status: DC
Start: 2013-01-30 — End: 2013-02-02

## 2013-01-30 MED ORDER — MENTHOL 3 MG MT LOZG
1.0000 | LOZENGE | OROMUCOSAL | Status: DC | PRN
  Filled 2013-01-30: qty 9

## 2013-01-30 MED ORDER — HYDROMORPHONE HCL PF 1 MG/ML IJ SOLN
0.5000 mg | INTRAMUSCULAR | Status: DC | PRN
  Administered 2013-02-01: 0.5 mg via INTRAVENOUS
  Filled 2013-01-30: qty 1

## 2013-01-30 MED ORDER — PROPOFOL 10 MG/ML IV BOLUS
INTRAVENOUS | Status: DC | PRN
  Administered 2013-01-30: 20 mg via INTRAVENOUS
  Administered 2013-01-30: 200 mg via INTRAVENOUS

## 2013-01-30 MED ORDER — SODIUM CHLORIDE 0.9 % IJ SOLN: 9.0000 mL | INTRAMUSCULAR | Status: DC | PRN

## 2013-01-30 MED ORDER — METHOCARBAMOL 500 MG PO TABS
500.0000 mg | ORAL_TABLET | Freq: Four times a day (QID) | ORAL | Status: DC | PRN
  Administered 2013-01-30 – 2013-02-02 (×5): 500 mg via ORAL
  Filled 2013-01-30 (×5): qty 1

## 2013-01-30 MED ORDER — DIPHENHYDRAMINE HCL 12.5 MG/5ML PO ELIX
12.5000 mg | ORAL_SOLUTION | Freq: Four times a day (QID) | ORAL | Status: DC | PRN
  Administered 2013-01-31: 12.5 mg via ORAL
  Filled 2013-01-30 (×2): qty 5

## 2013-01-30 MED ORDER — ONDANSETRON HCL 4 MG PO TABS
4.0000 mg | ORAL_TABLET | Freq: Four times a day (QID) | ORAL | Status: DC | PRN
  Filled 2013-01-30: qty 1

## 2013-01-30 MED ORDER — PROMETHAZINE HCL 25 MG/ML IJ SOLN: 6.2500 mg | INTRAMUSCULAR | Status: DC | PRN

## 2013-01-30 MED ORDER — ONDANSETRON HCL 4 MG/2ML IJ SOLN
INTRAMUSCULAR | Status: DC | PRN
  Administered 2013-01-30: 4 mg via INTRAVENOUS

## 2013-01-30 MED ORDER — PHENOL 1.4 % MT LIQD
1.0000 | OROMUCOSAL | Status: DC | PRN
  Filled 2013-01-30: qty 177

## 2013-01-30 MED ORDER — MIDAZOLAM HCL 5 MG/5ML IJ SOLN
INTRAMUSCULAR | Status: DC | PRN
  Administered 2013-01-30: 2 mg via INTRAVENOUS

## 2013-01-30 MED ORDER — POVIDONE-IODINE 7.5 % EX SOLN: Freq: Once | CUTANEOUS | Status: DC

## 2013-01-30 MED ORDER — ACETAMINOPHEN 650 MG RE SUPP
650.0000 mg | Freq: Four times a day (QID) | RECTAL | Status: DC | PRN
  Filled 2013-01-30: qty 1

## 2013-01-30 MED ORDER — CEFAZOLIN SODIUM-DEXTROSE 2-3 GM-% IV SOLR
2.0000 g | Freq: Four times a day (QID) | INTRAVENOUS | Status: AC
  Administered 2013-01-30 – 2013-01-31 (×2): 2 g via INTRAVENOUS
  Filled 2013-01-30 (×2): qty 50

## 2013-01-30 MED ORDER — LIDOCAINE HCL (CARDIAC) 20 MG/ML IV SOLN
INTRAVENOUS | Status: DC | PRN
  Administered 2013-01-30: 100 mg via INTRAVENOUS

## 2013-01-30 MED ORDER — ACETAMINOPHEN 10 MG/ML IV SOLN
INTRAVENOUS | Status: DC | PRN
  Administered 2013-01-30: 1000 mg via INTRAVENOUS

## 2013-01-30 MED ORDER — LACTATED RINGERS IV SOLN
INTRAVENOUS | Status: DC
  Administered 2013-01-30: 16:00:00 via INTRAVENOUS
  Administered 2013-01-30: 1000 mL via INTRAVENOUS
  Administered 2013-01-30: 14:00:00 via INTRAVENOUS

## 2013-01-30 MED ORDER — HYDROMORPHONE HCL PF 1 MG/ML IJ SOLN: 0.2500 mg | INTRAMUSCULAR | Status: DC | PRN

## 2013-01-30 MED ORDER — HYDROCODONE-ACETAMINOPHEN 5-325 MG PO TABS
1.0000 | ORAL_TABLET | ORAL | Status: DC | PRN
Start: 2013-01-30 — End: 2013-01-31
  Administered 2013-01-30: 1 via ORAL
  Filled 2013-01-30: qty 1

## 2013-01-30 MED ORDER — SODIUM CHLORIDE 0.9 % IR SOLN
Status: DC | PRN
  Administered 2013-01-30: 1000 mL

## 2013-01-30 MED ORDER — INSULIN ASPART 100 UNIT/ML ~~LOC~~ SOLN
0.0000 [IU] | Freq: Three times a day (TID) | SUBCUTANEOUS | Status: DC
  Administered 2013-01-31 (×3): 3 [IU] via SUBCUTANEOUS
  Administered 2013-02-01: 4 [IU] via SUBCUTANEOUS
  Administered 2013-02-01 – 2013-02-02 (×2): 3 [IU] via SUBCUTANEOUS

## 2013-01-30 MED ORDER — ACETAMINOPHEN 325 MG PO TABS: 650.0000 mg | ORAL_TABLET | Freq: Four times a day (QID) | ORAL | Status: DC | PRN

## 2013-01-30 MED ORDER — LEVOTHYROXINE SODIUM 125 MCG PO TABS
125.0000 ug | ORAL_TABLET | Freq: Every day | ORAL | Status: DC
  Administered 2013-01-31 – 2013-02-02 (×3): 125 ug via ORAL
  Filled 2013-01-30 (×4): qty 1

## 2013-01-30 MED ORDER — EPHEDRINE SULFATE 50 MG/ML IJ SOLN
INTRAMUSCULAR | Status: DC | PRN
  Administered 2013-01-30 (×2): 10 mg via INTRAVENOUS

## 2013-01-30 MED ORDER — HYDROMORPHONE HCL PF 1 MG/ML IJ SOLN
INTRAMUSCULAR | Status: DC | PRN
  Administered 2013-01-30 (×2): 1 mg via INTRAVENOUS

## 2013-01-30 MED ORDER — SODIUM CHLORIDE 0.9 % IV SOLN
INTRAVENOUS | Status: DC
  Administered 2013-01-31 – 2013-02-01 (×2): via INTRAVENOUS

## 2013-01-30 MED ORDER — CEFAZOLIN SODIUM 10 G IJ SOLR
3.0000 g | INTRAMUSCULAR | Status: AC
  Administered 2013-01-30: 3 g via INTRAVENOUS
  Filled 2013-01-30: qty 3000

## 2013-01-30 MED ORDER — NALOXONE HCL 0.4 MG/ML IJ SOLN: 0.4000 mg | INTRAMUSCULAR | Status: DC | PRN

## 2013-01-30 SURGICAL SUPPLY — 55 items
BAG ZIPLOCK 12X15 (MISCELLANEOUS) ×4 IMPLANT
BANDAGE ELASTIC 4 VELCRO ST LF (GAUZE/BANDAGES/DRESSINGS) IMPLANT
BANDAGE ELASTIC 6 VELCRO ST LF (GAUZE/BANDAGES/DRESSINGS) IMPLANT
BANDAGE ESMARK 6X9 LF (GAUZE/BANDAGES/DRESSINGS) ×1 IMPLANT
BANDAGE GAUZE ELAST BULKY 4 IN (GAUZE/BANDAGES/DRESSINGS) ×2 IMPLANT
BLADE SAG 18X100X1.27 (BLADE) ×2 IMPLANT
BNDG ESMARK 6X9 LF (GAUZE/BANDAGES/DRESSINGS) ×2
CEMENT BONE 1-PACK (Cement) ×4 IMPLANT
CLOTH BEACON ORANGE TIMEOUT ST (SAFETY) ×2 IMPLANT
CONT SPECI 4OZ STER CLIK (MISCELLANEOUS) ×2 IMPLANT
CUFF TOURN SGL QUICK 34 (TOURNIQUET CUFF)
CUFF TOURN SGL QUICK 44 (TOURNIQUET CUFF) ×2 IMPLANT
CUFF TRNQT CYL 34X4X40X1 (TOURNIQUET CUFF) IMPLANT
DRAPE EXTREMITY T 121X128X90 (DRAPE) ×2 IMPLANT
DRAPE LG THREE QUARTER DISP (DRAPES) ×4 IMPLANT
DRAPE POUCH INSTRU U-SHP 10X18 (DRAPES) ×2 IMPLANT
DRAPE U-SHAPE 47X51 STRL (DRAPES) ×2 IMPLANT
DRSG ADAPTIC 3X8 NADH LF (GAUZE/BANDAGES/DRESSINGS) IMPLANT
DRSG EMULSION OIL 3X16 NADH (GAUZE/BANDAGES/DRESSINGS) ×2 IMPLANT
DRSG PAD ABDOMINAL 8X10 ST (GAUZE/BANDAGES/DRESSINGS) IMPLANT
ELECT BLADE TIP CTD 4 INCH (ELECTRODE) ×2 IMPLANT
ELECT REM PT RETURN 9FT ADLT (ELECTROSURGICAL) ×2
ELECTRODE REM PT RTRN 9FT ADLT (ELECTROSURGICAL) ×1 IMPLANT
EVACUATOR 1/8 PVC DRAIN (DRAIN) ×2 IMPLANT
FACESHIELD LNG OPTICON STERILE (SAFETY) ×8 IMPLANT
GLOVE ECLIPSE 8.0 STRL XLNG CF (GLOVE) ×4 IMPLANT
GLOVE INDICATOR 8.0 STRL GRN (GLOVE) ×6 IMPLANT
GOWN STRL REIN XL XLG (GOWN DISPOSABLE) ×10 IMPLANT
HANDPIECE INTERPULSE COAX TIP (DISPOSABLE) ×1
IMMOBILIZER KNEE 20 (SOFTGOODS) ×2
IMMOBILIZER KNEE 20 THIGH 36 (SOFTGOODS) ×1 IMPLANT
KIT BASIN OR (CUSTOM PROCEDURE TRAY) ×2 IMPLANT
MANIFOLD NEPTUNE II (INSTRUMENTS) ×2 IMPLANT
NEEDLE HYPO 22GX1.5 SAFETY (NEEDLE) ×2 IMPLANT
NS IRRIG 1000ML POUR BTL (IV SOLUTION) ×2 IMPLANT
PACK TOTAL JOINT (CUSTOM PROCEDURE TRAY) ×2 IMPLANT
POSITIONER SURGICAL ARM (MISCELLANEOUS) ×2 IMPLANT
SET HNDPC FAN SPRY TIP SCT (DISPOSABLE) ×1 IMPLANT
SPONGE GAUZE 4X4 12PLY (GAUZE/BANDAGES/DRESSINGS) IMPLANT
SPONGE LAP 18X18 X RAY DECT (DISPOSABLE) IMPLANT
SPONGE SURGIFOAM ABS GEL 100 (HEMOSTASIS) IMPLANT
STAPLER VISISTAT 35W (STAPLE) ×2 IMPLANT
STEM REGULAR FLUTED 11X80MM (Stem) ×2 IMPLANT
SUCTION FRAZIER 12FR DISP (SUCTIONS) ×2 IMPLANT
SUT BONE WAX W31G (SUTURE) ×2 IMPLANT
SUT VIC AB 1 CT1 27 (SUTURE) ×6
SUT VIC AB 1 CT1 27XBRD ANTBC (SUTURE) ×6 IMPLANT
SUT VIC AB 2-0 CT1 27 (SUTURE) ×2
SUT VIC AB 2-0 CT1 27XBRD (SUTURE) ×2 IMPLANT
SYR 20CC LL (SYRINGE) ×2 IMPLANT
TOWEL OR 17X26 10 PK STRL BLUE (TOWEL DISPOSABLE) ×4 IMPLANT
TOWER CARTRIDGE SMART MIX (DISPOSABLE) ×2 IMPLANT
TRAY FOLEY CATH 14FRSI W/METER (CATHETERS) ×2 IMPLANT
WATER STERILE IRR 1500ML POUR (IV SOLUTION) ×4 IMPLANT
WRAP KNEE MAXI GEL POST OP (GAUZE/BANDAGES/DRESSINGS) ×4 IMPLANT

## 2013-01-30 NOTE — Plan of Care (Signed)
Problem: Consults Goal: Diagnosis- Total Joint Replacement Primary Total Knee     

## 2013-01-30 NOTE — Anesthesia Preprocedure Evaluation (Signed)
Anesthesia Evaluation  Patient identified by MRN, date of birth, ID band Patient awake    Reviewed: Allergy & Precautions, H&P , NPO status , Patient's Chart, lab work & pertinent test results  Airway Mallampati: III TM Distance: <3 FB Neck ROM: Full    Dental no notable dental hx.    Pulmonary sleep apnea ,  breath sounds clear to auscultation  + decreased breath sounds      Cardiovascular hypertension, Pt. on medications Rhythm:Regular Rate:Normal     Neuro/Psych negative neurological ROS  negative psych ROS   GI/Hepatic negative GI ROS, Neg liver ROS,   Endo/Other  diabetesHypothyroidism Morbid obesity  Renal/GU negative Renal ROS  negative genitourinary   Musculoskeletal negative musculoskeletal ROS (+)   Abdominal   Peds negative pediatric ROS (+)  Hematology negative hematology ROS (+)   Anesthesia Other Findings   Reproductive/Obstetrics negative OB ROS                           Anesthesia Physical  Anesthesia Plan  ASA: III  Anesthesia Plan: General   Post-op Pain Management:    Induction: Intravenous  Airway Management Planned: Oral ETT  Additional Equipment:   Intra-op Plan:   Post-operative Plan: Extubation in OR  Informed Consent: I have reviewed the patients History and Physical, chart, labs and discussed the procedure including the risks, benefits and alternatives for the proposed anesthesia with the patient or authorized representative who has indicated his/her understanding and acceptance.   Dental advisory given  Plan Discussed with: CRNA and Surgeon  Anesthesia Plan Comments:         Anesthesia Quick Evaluation  

## 2013-01-30 NOTE — Consult Note (Signed)
She has had no health changes since Mr Loel Ro and P and is ready for surgery.

## 2013-01-30 NOTE — Anesthesia Postprocedure Evaluation (Signed)
  Anesthesia Post-op Note  Patient: Amy Burns  Procedure(s) Performed: Procedure(s) (LRB): RIGHT TOTAL KNEE ARTHROPLASTY (Right)  Patient Location: PACU  Anesthesia Type: General  Level of Consciousness: awake and alert   Airway and Oxygen Therapy: Patient Spontanous Breathing  Post-op Pain: mild  Post-op Assessment: Post-op Vital signs reviewed, Patient's Cardiovascular Status Stable, Respiratory Function Stable, Patent Airway and No signs of Nausea or vomiting  Last Vitals:  Filed Vitals:   01/30/13 1645  BP: 135/61  Pulse: 102  Temp:   Resp: 13    Post-op Vital Signs: stable   Complications: No apparent anesthesia complications

## 2013-01-30 NOTE — Transfer of Care (Signed)
Immediate Anesthesia Transfer of Care Note  Patient: Amy Burns  Procedure(s) Performed: Procedure(s): RIGHT TOTAL KNEE ARTHROPLASTY (Right)  Patient Location: PACU  Anesthesia Type:General  Level of Consciousness: sedated  Airway & Oxygen Therapy: Patient Spontanous Breathing and Patient connected to face mask oxygen  Post-op Assessment: Report given to PACU RN and Post -op Vital signs reviewed and stable  Post vital signs: Reviewed and stable  Complications: No apparent anesthesia complications

## 2013-01-31 ENCOUNTER — Encounter (HOSPITAL_COMMUNITY): Payer: Self-pay | Admitting: Orthopedic Surgery

## 2013-01-31 LAB — CBC
HCT: 32.5 % — ABNORMAL LOW (ref 36.0–46.0)
Hemoglobin: 10.3 g/dL — ABNORMAL LOW (ref 12.0–15.0)
MCHC: 31.7 g/dL (ref 30.0–36.0)
WBC: 7.9 10*3/uL (ref 4.0–10.5)

## 2013-01-31 LAB — GLUCOSE, CAPILLARY
Glucose-Capillary: 145 mg/dL — ABNORMAL HIGH (ref 70–99)
Glucose-Capillary: 142 mg/dL — ABNORMAL HIGH (ref 70–99)
Glucose-Capillary: 146 mg/dL — ABNORMAL HIGH (ref 70–99)
Glucose-Capillary: 149 mg/dL — ABNORMAL HIGH (ref 70–99)

## 2013-01-31 NOTE — Progress Notes (Signed)
Physical Therapy Treatment Patient Details Name: Amy Burns MRN: [id number] DOB: Jan 15, 1941 Today's Date: 01/31/2013 Time: 4098-1191 PT Time Calculation (min): 46 min  PT Assessment / Plan / Recommendation Comments on Treatment Session  Pt progressing slowly with mobility, however did do better this afternoon and increased amb distance.     Follow Up Recommendations  Home health PT;SNF;Supervision/Assistance - 24 hour     Does the patient have the potential to tolerate intense rehabilitation     Barriers to Discharge        Equipment Recommendations  None recommended by PT    Recommendations for Other Services OT consult  Frequency 7X/week   Plan Discharge plan needs to be updated    Precautions / Restrictions Precautions Precautions: Fall;Knee Required Braces or Orthoses: Knee Immobilizer - Right Knee Immobilizer - Right: Discontinue once straight leg raise with < 10 degree lag Restrictions Weight Bearing Restrictions: No Other Position/Activity Restrictions: WBAT   Pertinent Vitals/Pain 4/10    Mobility  Bed Mobility Bed Mobility: Sit to Supine Sit to Supine: 1: +2 Total assist;HOB flat Sit to Supine: Patient Percentage: 60% Details for Bed Mobility Assistance: Assist for BLE into bed with cues for adjusting hips once in bed.  Transfers Transfers: Sit to Stand;Stand to Sit Sit to Stand: 1: +2 Total assist;From elevated surface;With upper extremity assist;From bed Sit to Stand: Patient Percentage: 60% Stand to Sit: 1: +2 Total assist;With upper extremity assist;With armrests;To chair/3-in-1 Stand to Sit: Patient Percentage: 70% Details for Transfer Assistance: Assist to rise, steady and ensure controlled descent with cues for hand placement and LE management.  Did require some assist for RLE as she was sitting.  Ambulation/Gait Ambulation/Gait Assistance: 1: +2 Total assist Ambulation/Gait: Patient Percentage: 70% Ambulation Distance (Feet): 20 Feet Assistive  device: Rolling walker Ambulation/Gait Assistance Details: Cues for sequencing/technique with RW, maintaining upright posture and continued breathing as pt tends to hold breath with stepping L foot forwards. Gait Pattern: Step-to pattern;Decreased stride length;Trunk flexed;Antalgic Gait velocity: decreased    Exercises Total Joint Exercises Ankle Circles/Pumps: AROM;Both;20 reps Quad Sets: AROM;Right;10 reps Heel Slides: AAROM;Right;10 reps Hip ABduction/ADduction: AAROM;Right;10 reps Straight Leg Raises: AAROM;Right;10 reps Goniometric ROM: approx 30 deg knee flex.  Limited also by body habitus.    PT Diagnosis:    PT Problem List:   PT Treatment Interventions:     PT Goals Acute Rehab PT Goals PT Goal Formulation: With patient Time For Goal Achievement: 02/07/13 Potential to Achieve Goals: Good Pt will go Sit to Supine/Side: with supervision PT Goal: Sit to Supine/Side - Progress: Progressing toward goal Pt will go Sit to Stand: with min assist PT Goal: Sit to Stand - Progress: Progressing toward goal Pt will go Stand to Sit: with min assist PT Goal: Stand to Sit - Progress: Progressing toward goal Pt will Ambulate: 16 - 50 feet;with min assist;with least restrictive assistive device PT Goal: Ambulate - Progress: Progressing toward goal Pt will Perform Home Exercise Program: with supervision, verbal cues required/provided PT Goal: Perform Home Exercise Program - Progress: Progressing toward goal  Visit Information  Last PT Received On: 01/31/13 Assistance Needed: +2    Subjective Data  Subjective: I'm stalling Patient Stated Goal: to return home   Cognition  Cognition Arousal/Alertness: Awake/alert Behavior During Therapy: WFL for tasks assessed/performed Overall Cognitive Status: Within Functional Limits for tasks assessed    Balance     End of Session PT - End of Session Equipment Utilized During Treatment: Gait belt;Right knee immobilizer Activity Tolerance:  Patient limited by fatigue;Patient limited by pain Patient left: in bed;with call bell/phone within reach;with family/visitor present Nurse Communication: Mobility status CPM Right Knee CPM Right Knee: On   GP     Vista Deck 01/31/2013, 5:52 PM

## 2013-01-31 NOTE — Progress Notes (Signed)
Quick Note:  Left a message for return call. ______ 

## 2013-01-31 NOTE — Progress Notes (Signed)
Rehab Admissions Coordinator Note:  Patient was screened by Trish Mage for appropriateness for an Inpatient Acute Rehab Consult.  At this time, we are recommending Skilled Nursing Facility.  I have talked with insurance case Production designer, theatre/television/film at [employer].  We would not be able to get authorization for acute inpatient rehab for this patient with a single joint replacement.  Likely will need SNF upon discharge.  Trish Mage 01/31/2013, 11:37 AM  I can be reached at [phone].

## 2013-01-31 NOTE — Op Note (Signed)
Amy Burns, Amy Burns NO.:  [id number]  MEDICAL RECORD NO.:  [id number]  LOCATION:  1618                         FACILITY:  [facility]  PHYSICIAN:  Marlowe Kays, M.D.  DATE OF BIRTH:  10/26/1940  DATE OF PROCEDURE:  01/30/2013 DATE OF DISCHARGE:                              OPERATIVE REPORT   PREOPERATIVE DIAGNOSIS:  End-stage osteoarthritis, right knee.  POSTOPERATIVE DIAGNOSIS:  End-stage osteoarthritis, right knee.  OPERATION:  Osteonics total knee replacement, right.  SURGEON:  Marlowe Kays, MD.  ASSISTANTDruscilla Brownie. Cherlynn June.  ANESTHESIA:  General.  PATHOLOGY AND JUSTIFICATION FOR PROCEDURE:  She has had a successful total knee replacement in the left number of years ago.  She has had progressive pain and the flexion contracture in the right knee and bone- on-bone abutment medially with medial shift of the femur on the tibia. This was felt that she should have a tibial stem added on to the tibial base.  Mr. Angie Fava assistance was necessary because of the feet flexed during the operation.  PROCEDURE:  Prophylactic antibiotics, satisfied general anesthesia, Foley catheter inserted, lateral hip stabilizer, shear foot, pneumatic tourniquet, right leg was prepped with tourniquet with a DuraPrep from tourniquet to ankle and draped in sterile field.  Ioban employed.  Time- out performed.  The leg was Esmarch out nonsterilely and tourniquet inflated to 350 mmHg.  Vertical midline incision down to patellar mechanism with medial parapatellar incision to open the joint.  The pace anserinus and medial collateral ligament were undermined  and with medial parapatellar incision, the joint was opened.  Osteophytes from around the patella and the femur were removed.  Because of her morbid obesity and short stature, I was unable to evert the patella and we shifted it lateralward.  I made a 5/16-inch drill hole in the distal femur followed by canal finder  and the axis liner for 5-degree valgus cut on the right knee.  The patient had flexion contracture, I elected to take 12 mm off the distal femur rather than the usual 10.  Rater than started with the cradle apparatus because the knee was very tight, I next went to the tibia and made a leveling cut, which allowed me to get the cradle apparatus with the sizing at size 7.  Scribe lines were placed on the distal femur and then the jig for making the distal femoral cuts was applied and anterior and posterior cuts, and posterior and anterior chamfer rings were then made.  I then returned to the tibia, and we sized to a 7, and I made my initial intramedullary drill hole followed by step-cut drill, canal finder, and intramedullary rod. I set it for 90 degree cut, and 4 mm cut off the depressed medial tibial plateau.  An initial cut was made, and I then returned to the femur were the guides were creating the notchplasty and patellar groove was placed and these 2 procedures completed.  I then placed the lamina spreader, and we removed bone and ligamentum flavum as well as some remnants of the menisci posteriorly.  I went through a trial reduction found that there was plenty of room with a 10 mm spacer.  I used the external rod splitting the bimalleolar distance, and placed scribe lines on the anterior tibia based on it.  While the knee was in extension, I then sized the patella with 26, then used the 10-mm recess cutting jig to make a 10 mm recess cut.  Three fixation drill holes were then placed using the guide, and I then placed a trial patella and removing bone from around the patella.  Returning to the femur and tibia, I then attached a size 7 base plate with 3 pins to the cut tibial surface.  I then used the tripod apparatus to ream for the keel up to a 7 cemented. I then followed this with a hand reaming up to 11 and 80 mm long extension stem.  We used a boss reamer proximally, then went  through a trial reduction with the tibial component and the 80 mm extension and this fit nicely.  Accordingly, we went ahead and opened some of the knee components.  At the same time, water picking the knee joint.  When the components pins assembled, we went ahead and mixed methylmethacrylate and when it appropriate hardness, went ahead and began gluing in the components starting first with the tibia only on the base plate impacting it and trimming away excess methacrylate and then the same thing was done for the femur.  Prior to this, I also placed Gelfoam into the distal femoral hole and while the methacrylate was being mixed, I injected 0.25% Marcaine with adrenaline in the soft tissues of the knee. We then held the knee of extension with a 10 mm spacer while we glued in the patella utilizing the patellar holding clamp.  Methacrylate hardened, I removed the 10 mm spacer, and we inspected the joint and removed small pieces of methacrylate and once again irrigated the knee joint  currently.  I then tried a 12 and 15 mm spacer, the 15 mm spacer was the best.  Accordingly, we went ahead and placed the 15 mm spacer. She had excellent knee motion stability, and she did not need lateral release.  I then closed wound over Hemovac drain with #1 Vicryl initially in 2 layers in the quadriceps tendon and distally in the synovium and capsule, combination of #1 and 2-0 Vicryl and the subcutaneous tissue, staples in the skin.  Tourniquet was then released to 2 hours of tourniquet time.  Betadine, Adaptic dry sterile dressing were applied.  She tolerated the procedure well, was taken to recovery room in satisfied condition with no known complications.          ______________________________ Marlowe Kays, M.D.     JA/MEDQ  D:  01/30/2013  T:  01/31/2013  Job:  [id number]

## 2013-01-31 NOTE — Progress Notes (Signed)
Clinical Social Work Department CLINICAL SOCIAL WORK PLACEMENT NOTE 01/31/2013  Patient:  Amy Burns, Amy Burns  Account Number:  [id number] Admit date:  01/30/2013  Clinical Social Worker:  Cori Razor, LCSW  Date/time:  01/31/2013 02:03 PM  Clinical Social Work is seeking post-discharge placement for this patient at the following level of care:   SKILLED NURSING   (*CSW will update this form in Epic as items are completed)   01/31/2013  Patient/family provided with [facility] Department of Clinical Social Work's list of facilities offering this level of care within the geographic area requested by the patient (or if unable, by the patient's family).  01/31/2013  Patient/family informed of their freedom to choose among providers that offer the needed level of care, that participate in Medicare, Medicaid or managed care program needed by the patient, have an available bed and are willing to accept the patient.    Patient/family informed of [facility]' ownership interest in [facility], as well as of the fact that they are under no obligation to receive care at this facility.  PASARR submitted to EDS on 01/31/2013 PASARR number received from EDS on 01/31/2013  FL2 transmitted to all facilities in geographic area requested by pt/family on  01/31/2013 FL2 transmitted to all facilities within larger geographic area on   Patient informed that his/her managed care company has contracts with or will negotiate with  certain facilities, including the following:     Patient/family informed of bed offers received:   Patient chooses bed at  Physician recommends and patient chooses bed at    Patient to be transferred to  on   Patient to be transferred to facility by   The following physician request were entered in Epic:   Additional Comments:  Cori Razor LCSW [phone]

## 2013-01-31 NOTE — Progress Notes (Signed)
Utilization review completed.  

## 2013-01-31 NOTE — Evaluation (Signed)
Physical Therapy Evaluation Patient Details Name: Amy Burns MRN: [id number] DOB: April 10, 1941 Today's Date: 01/31/2013 Time: 4401-0272 PT Time Calculation (min): 38 min  PT Assessment / Plan / Recommendation Clinical Impression  Pt presents s/p R TKA POD 1 with decreased strength, ROM and mobility.  Tolerated OOB and very little ambulation due to increased pain and fatigue.  Pt will benefit from skilled PT in acute venue to address deficits.  PT recommends CIR for follow up therapy at D/C to maximize pts safety and function.     PT Assessment  Patient needs continued PT services    Follow Up Recommendations  CIR;Supervision/Assistance - 24 hour    Does the patient have the potential to tolerate intense rehabilitation      Barriers to Discharge None      Equipment Recommendations  None recommended by PT    Recommendations for Other Services OT consult   Frequency 7X/week    Precautions / Restrictions Precautions Precautions: Fall;Knee Required Braces or Orthoses: Knee Immobilizer - Right Knee Immobilizer - Right: Discontinue once straight leg raise with < 10 degree lag Restrictions Weight Bearing Restrictions: No Other Position/Activity Restrictions: WBAT   Pertinent Vitals/Pain 6/10 pain, ice packs applied, PCA encouraged.       Mobility  Bed Mobility Bed Mobility: Supine to Sit Supine to Sit: 1: +2 Total assist;HOB elevated;With rails Supine to Sit: Patient Percentage: 50% Details for Bed Mobility Assistance: Assist for RLE out of bed and some for trunk to attain sitting position.  Max cues for hand placement on bed to self assist instead of rails to better simulate home environment.  Transfers Transfers: Sit to Stand;Stand to Sit Sit to Stand: 1: +2 Total assist;From elevated surface;With upper extremity assist;From bed Sit to Stand: Patient Percentage: 60% Stand to Sit: 1: +2 Total assist;With upper extremity assist;With armrests;To chair/3-in-1 Stand to Sit:  Patient Percentage: 60% Details for Transfer Assistance: Assist to rise, steady and ensure controlled descent with cues for hand placement and LE management.  Did require some assist for RLE as she was sitting.  Ambulation/Gait Ambulation/Gait Assistance: 1: +2 Total assist Ambulation/Gait: Patient Percentage: 60% Ambulation Distance (Feet): 8 Feet Assistive device: Rolling walker Ambulation/Gait Assistance Details: Cues for sequencing/technique with RW, maintaining upright posture and slow controlled pursed lip breathing.  Ambulated on RA with SaO2 at 94% during ambulation, but was 90% after assisting back to chair.  Reapplied 2L O2 in room.  Gait Pattern: Step-to pattern;Decreased stride length;Trunk flexed;Antalgic Gait velocity: decreased Stairs: No Wheelchair Mobility Wheelchair Mobility: No    Exercises     PT Diagnosis: Difficulty walking;Generalized weakness;Acute pain  PT Problem List: Decreased strength;Decreased range of motion;Decreased activity tolerance;Decreased balance;Decreased mobility;Decreased coordination;Decreased knowledge of use of DME;Decreased safety awareness;Decreased knowledge of precautions;Cardiopulmonary status limiting activity;Obesity;Pain PT Treatment Interventions: DME instruction;Gait training;Functional mobility training;Therapeutic activities;Therapeutic exercise;Balance training;Patient/family education   PT Goals Acute Rehab PT Goals PT Goal Formulation: With patient Time For Goal Achievement: 02/07/13 Potential to Achieve Goals: Good Pt will go Supine/Side to Sit: with supervision PT Goal: Supine/Side to Sit - Progress: Goal set today Pt will go Sit to Supine/Side: with supervision PT Goal: Sit to Supine/Side - Progress: Goal set today Pt will go Sit to Stand: with min assist PT Goal: Sit to Stand - Progress: Goal set today Pt will go Stand to Sit: with min assist PT Goal: Stand to Sit - Progress: Goal set today Pt will Ambulate: 16 - 50  feet;with min assist;with least restrictive assistive device PT  Goal: Ambulate - Progress: Goal set today Pt will Perform Home Exercise Program: with supervision, verbal cues required/provided PT Goal: Perform Home Exercise Program - Progress: Goal set today  Visit Information  Last PT Received On: 01/31/13 Assistance Needed: +2    Subjective Data  Subjective: I need to take a nap first.  Patient Stated Goal: to return home   Prior Functioning  Home Living Lives With: Spouse Available Help at Discharge: Family;Available 24 hours/day Type of Home: House Home Access: Stairs to enter [employer] of Steps: 1 then 1 Entrance Stairs-Rails: None Home Layout: One level Bathroom Shower/Tub: Engineer, manufacturing systems: Handicapped height Home Adaptive Equipment: Walker - rolling;Bedside commode/3-in-1;Straight cane Prior Function Level of Independence: Independent with assistive device(s) Driving: Yes Vocation: Retired Musician: No difficulties    Copywriter, advertising Arousal/Alertness: Awake/alert Behavior During Therapy: WFL for tasks assessed/performed Overall Cognitive Status: Within Functional Limits for tasks assessed    Extremity/Trunk Assessment Right Lower Extremity Assessment RLE ROM/Strength/Tone: Deficits RLE ROM/Strength/Tone Deficits: ankle motions WFL, unable to perform SLR without assist, will assess knee flex ROM when doing exercises in pm session.  RLE Sensation: WFL - Light Touch Left Lower Extremity Assessment LLE ROM/Strength/Tone: WFL for tasks assessed LLE Sensation: WFL - Light Touch Trunk Assessment Trunk Assessment: Kyphotic   Balance    End of Session PT - End of Session Equipment Utilized During Treatment: Gait belt;Right knee immobilizer Activity Tolerance: Patient limited by fatigue;Patient limited by pain Patient left: in chair;with call bell/phone within reach Nurse Communication: Mobility status  GP      Vista Deck 01/31/2013, 10:55 AM

## 2013-01-31 NOTE — Progress Notes (Signed)
Subjective: 1 Day Post-Op Procedure(s) (LRB): RIGHT TOTAL KNEE ARTHROPLASTY (Right) Patient reports pain as 3 on 0-10 scale.    Objective: Vital signs in last 24 hours: Temp:  [97.2 F (36.2 C)-98.7 F (37.1 C)] 98.7 F (37.1 C) (05/28 0543) Pulse Rate:  [81-107] 89 (05/28 0543) Resp:  [9-25] 14 (05/28 0543) BP: (108-180)/(46-84) 151/64 mmHg (05/28 0543) SpO2:  [94 %-100 %] 99 % (05/28 0543) Weight:  [136.079 kg (300 lb)] 136.079 kg (300 lb) (05/27 1948)  Intake/Output from previous day: 05/27 0701 - 05/28 0700 In: 3501.7 [P.O.:120; I.V.:2931.7; IV Piggyback:50] Out: 904 [Urine:220; Drains:684] Intake/Output this shift: Total I/O In: 60 [P.O.:60] Out: 55 [Urine:50; Drains:5]   Recent Labs  01/31/13 0430  HGB 10.3*    Recent Labs  01/31/13 0430  WBC 7.9  RBC 3.22*  HCT 32.5*  PLT 168   No results found for this basename: NA, K, CL, CO2, BUN, CREATININE, GLUCOSE, CALCIUM,  in the last 72 hours No results found for this basename: LABPT, INR,  in the last 72 hours  Neurologically intact Sensation intact distally Intact pulses distally  Assessment/Plan: 1 Day Post-Op Procedure(s) (LRB): RIGHT TOTAL KNEE ARTHROPLASTY (Right) Advance diet Up with therapy. Hemovac RLE Dc'd.  Pt. Awake, alert, aware of situation. Has home DME equipment. Post op blood loss Anemia noted.  Amy Burns,Amy Burns 01/31/2013, 7:33 AM

## 2013-01-31 NOTE — Progress Notes (Signed)
Clinical Social Work Department BRIEF PSYCHOSOCIAL ASSESSMENT 01/31/2013  Patient:  Amy Burns, Amy Burns     Account Number:  [id number]     Admit date:  01/30/2013  Clinical Social Worker:  Candie Chroman  Date/Time:  01/31/2013 01:28 PM  Referred by:  Physician  Date Referred:  01/31/2013 Referred for  SNF Placement   Other Referral:   Interview type:  Patient Other interview type:    PSYCHOSOCIAL DATA Living Status:  HUSBAND Admitted from facility:   Level of care:   Primary support name:  Jenness Stemler Primary support relationship to patient:  SPOUSE Degree of support available:   supportive    CURRENT CONCERNS Current Concerns  Post-Acute Placement   Other Concerns:    SOCIAL WORK ASSESSMENT / PLAN Pt is a 72 yr old female living at home prior to hospitalization. CSW met with pt to assist with d/c planning. PN reviewed. PT has recommended CIR / 24 hr assistance / supervision. CIR is unable to assist. Pt hopes to get her pain better under control and return home with [facility]. Pt is willing to consider ST Rehab if her progression is slow and her husband agrees with plan. CSW has permission to initiate SNF search and will meet again with pt/spouse to continue assisting with d/c planning.   Assessment/plan status:  Psychosocial Support/Ongoing Assessment of Needs Other assessment/ plan:   Information/referral to community resources:   SNF list provided.    PATIENT'S/FAMILY'S RESPONSE TO PLAN OF CARE: Pt would like to return home with [facility] services but will discuss ST Rehab placement with spouse if needed.    Cori Razor LCSW [phone]

## 2013-02-01 LAB — CBC
HCT: 29 % — ABNORMAL LOW (ref 36.0–46.0)
Hemoglobin: 9.2 g/dL — ABNORMAL LOW (ref 12.0–15.0)
MCHC: 31.7 g/dL (ref 30.0–36.0)
RDW: 13.8 % (ref 11.5–15.5)
WBC: 10.7 10*3/uL — ABNORMAL HIGH (ref 4.0–10.5)

## 2013-02-01 LAB — GLUCOSE, CAPILLARY: Glucose-Capillary: 152 mg/dL — ABNORMAL HIGH (ref 70–99)

## 2013-02-01 NOTE — Plan of Care (Signed)
Problem: Consults Goal: Diagnosis- Total Joint Replacement Outcome: Completed/Met Date Met:  02/01/13 Primary Total Knee RIGHT

## 2013-02-01 NOTE — Progress Notes (Signed)
Patient ID: Amy Burns, female   DOB: 11/15/1940, 72 y.o.   MRN: [id number] POD 2--Hgb-9.2.  Afebrile.  Will progress with PT and try to work off iv pain meds.

## 2013-02-01 NOTE — Progress Notes (Signed)
Physical Therapy Treatment Patient Details Name: Amy Burns MRN: [id number] DOB: 14-Jul-1941 Today's Date: 02/01/2013 Time: 1435- 1518    PT Assessment / Plan / Recommendation Comments on Treatment Session  pt progressing although still requirng assist and would benefit from SNF rehab; pt moving better this pm but requires extra time for all activities    Follow Up Recommendations  SNF;Supervision/Assistance - 24 hour     Does the patient have the potential to tolerate intense rehabilitation     Barriers to Discharge        Equipment Recommendations  None recommended by PT    Recommendations for Other Services    Frequency 7X/week   Plan Discharge plan remains appropriate;Frequency remains appropriate    Precautions / Restrictions Precautions Precautions: Fall;Knee Required Braces or Orthoses: Knee Immobilizer - Right Knee Immobilizer - Right: Discontinue once straight leg raise with < 10 degree lag Restrictions Other Position/Activity Restrictions: WBAT   Pertinent Vitals/Pain Min c/o right knee    Mobility  Bed Mobility Bed Mobility: Sit to Supine Sit to Supine: HOB flat;4: Min assist Details for Bed Mobility Assistance: cues for technique Transfers Transfers: Sit to Stand;Stand to Sit;Stand Pivot Transfers Sit to Stand: 4: Min assist;From chair/3-in-1;With upper extremity assist Stand to Sit: 4: Min assist;With upper extremity assist;To chair/3-in-1;To bed Stand Pivot Transfers: 4: Min assist Details for Transfer Assistance: vebal cues for hand placement and extend R LE out in front before standing/sitting. Did need assist to support R LE and help wtih trunk as she sat up from reclined position in chair.  Ambulation/Gait Ambulation/Gait Assistance: 4: Min assist Ambulation Distance (Feet): 25 Feet Assistive device: Rolling walker Ambulation/Gait Assistance Details: cues for sequencing, RW position and wt shift, occasional assist to advance RLE Gait Pattern:  Step-to pattern;Decreased stride length;Trunk flexed;Antalgic    Exercises Total Joint Exercises Ankle Circles/Pumps: AROM;Both;20 reps Quad Sets: AROM;Right;10 reps Heel Slides: AAROM;Right;10 reps Hip ABduction/ADduction: AAROM;Right;10 reps   PT Diagnosis:    PT Problem List:   PT Treatment Interventions:     PT Goals Acute Rehab PT Goals Time For Goal Achievement: 02/07/13 Potential to Achieve Goals: Good Pt will go Supine/Side to Sit: with supervision PT Goal: Supine/Side to Sit - Progress: Progressing toward goal Pt will go Sit to Supine/Side: with supervision PT Goal: Sit to Supine/Side - Progress: Progressing toward goal Pt will go Sit to Stand: with min assist PT Goal: Sit to Stand - Progress: Progressing toward goal Pt will go Stand to Sit: with min assist PT Goal: Stand to Sit - Progress: Progressing toward goal Pt will Ambulate: 16 - 50 feet;with min assist;with least restrictive assistive device PT Goal: Ambulate - Progress: Progressing toward goal Pt will Perform Home Exercise Program: with supervision, verbal cues required/provided PT Goal: Perform Home Exercise Program - Progress: Progressing toward goal  Visit Information  Last PT Received On: 02/01/13 Assistance Needed: +2    Subjective Data  Subjective: I do it with a smile   Cognition  Cognition Arousal/Alertness: Awake/alert Behavior During Therapy: WFL for tasks assessed/performed Overall Cognitive Status: Within Functional Limits for tasks assessed    Balance     End of Session PT - End of Session Equipment Utilized During Treatment: Gait belt;Right knee immobilizer Activity Tolerance: Patient tolerated treatment well Patient left: in bed;with call bell/phone within reach Nurse Communication: Mobility status   GP     [facility] 02/01/2013, 3:17 PM

## 2013-02-01 NOTE — Progress Notes (Signed)
Physical Therapy Treatment Patient Details Name: Amy Burns MRN: [id number] DOB: 12-Oct-1940 Today's Date: 02/01/2013 Time: [id number] ( and [phone]) PT Time Calculation (min): 24 min  PT Assessment / Plan / Recommendation Comments on Treatment Session  pt progressing although still requirng assist and would benefit from SNF rehab    Follow Up Recommendations  SNF;Supervision/Assistance - 24 hour     Does the patient have the potential to tolerate intense rehabilitation     Barriers to Discharge        Equipment Recommendations  None recommended by PT    Recommendations for Other Services    Frequency 7X/week   Plan Discharge plan needs to be updated    Precautions / Restrictions Precautions Precautions: Fall;Knee Required Braces or Orthoses: Knee Immobilizer - Right Knee Immobilizer - Right: Discontinue once straight leg raise with < 10 degree lag Restrictions Weight Bearing Restrictions: No Other Position/Activity Restrictions: WBAT   Pertinent Vitals/Pain Pain 7/10    Mobility  Bed Mobility Bed Mobility: Not assessed Details for Bed Mobility Assistance: up in chair Transfers Transfers: Sit to Stand;Stand to Sit;Stand Pivot Transfers Sit to Stand: 1: +2 Total assist;With upper extremity assist;From chair/3-in-1 Sit to Stand: Patient Percentage: 80% Stand to Sit: 1: +2 Total assist;To chair/3-in-1 Stand to Sit: Patient Percentage: 80% Stand Pivot Transfers: 1: +2 Total assist Stand Pivot Transfers: Patient Percentage: 80% Details for Transfer Assistance: vebal cues for hand placement and extend R LE out in front before standing/sitting. Did need assist to support R LE and help wtih trunk as she sat up from reclined position in chair.  Ambulation/Gait Ambulation/Gait Assistance: 4: Min assist Ambulation/Gait: Patient Percentage: 80% Ambulation Distance (Feet): 12 Feet (8') Assistive device: Rolling walker Ambulation/Gait Assistance Details: cues for  sequencing, RW position and wt shift, occasional assist to advance RLE Gait Pattern: Step-to pattern;Decreased stride length;Trunk flexed;Antalgic Gait velocity: decreased General Gait Details: DOE with am; HR 107, Sats 99%    Exercises Total Joint Exercises Ankle Circles/Pumps: AROM;Both;20 reps   PT Diagnosis:    PT Problem List:   PT Treatment Interventions:     PT Goals Acute Rehab PT Goals Time For Goal Achievement: 02/07/13 Potential to Achieve Goals: Good Pt will go Sit to Stand: with min assist PT Goal: Sit to Stand - Progress: Progressing toward goal Pt will go Stand to Sit: with min assist PT Goal: Stand to Sit - Progress: Progressing toward goal Pt will Ambulate: 16 - 50 feet;with min assist;with least restrictive assistive device PT Goal: Ambulate - Progress: Progressing toward goal  Visit Information  Last PT Received On: 02/01/13 Assistance Needed: +2 (more  for safety today) PT/OT Co-Evaluation/Treatment: Yes    Subjective Data  Patient Stated Goal: to return home, maybe SNF   Cognition  Cognition Arousal/Alertness: Awake/alert Behavior During Therapy: WFL for tasks assessed/performed Overall Cognitive Status: Within Functional Limits for tasks assessed    Balance     End of Session PT - End of Session Equipment Utilized During Treatment: Gait belt;Right knee immobilizer Activity Tolerance: Patient tolerated treatment well Patient left: in chair;with call bell/phone within reach;with family/visitor present Nurse Communication: Mobility status CPM Right Knee CPM Right Knee: Off   GP     [facility] 02/01/2013, 10:47 AM

## 2013-02-01 NOTE — Evaluation (Signed)
Occupational Therapy Evaluation Patient Details Name: Amy Burns MRN: [id number] DOB: 1941/06/05 Today's Date: 02/01/2013 Time: 7829-5621 OT Time Calculation (min): 33 min  OT Assessment / Plan / Recommendation Clinical Impression  Pt is s/p R TKA and has had the L knee replaced in 2009. She is limited by pain but is able to transfer to [facility] this visit. Will benefit from skilled OT services to improve ADL independence for next venue of care.     OT Assessment  Patient needs continued OT Services    Follow Up Recommendations  SNF;Supervision/Assistance - 24 hour    Barriers to Discharge      Equipment Recommendations  3 in 1 bedside comode (wide)    Recommendations for Other Services    Frequency  Min 2X/week    Precautions / Restrictions Precautions Precautions: Fall;Knee Required Braces or Orthoses: Knee Immobilizer - Right Knee Immobilizer - Right: Discontinue once straight leg raise with < 10 degree lag Restrictions Weight Bearing Restrictions: No Other Position/Activity Restrictions: WBAT        ADL  Eating/Feeding: Simulated;Independent Where Assessed - Eating/Feeding: Chair Grooming: Simulated;Wash/dry hands;Set up Where Assessed - Grooming: Supported sitting Upper Body Bathing: Simulated;Chest;Right arm;Left arm;Abdomen;Minimal assistance (due to lines/tubes) Where Assessed - Upper Body Bathing: Unsupported sitting Lower Body Bathing: +2 Total assistance Lower Body Bathing: Patient Percentage: 40% Where Assessed - Lower Body Bathing: Supported sit to stand Upper Body Dressing: Simulated;Minimal assistance (due to lines) Where Assessed - Upper Body Dressing: Unsupported sitting Lower Body Dressing: Simulated;+2 Total assistance Lower Body Dressing: Patient Percentage: 20% Where Assessed - Lower Body Dressing: Supported sit to stand Toilet Transfer: Performed;+2 Total assistance Toilet Transfer: Patient Percentage: 80% Toilet Transfer Method: Other  (comment) (chair to BSC at foot of bed) Toilet Transfer Equipment: Bedside commode Toileting - Clothing Manipulation and Hygiene: Performed;+2 Total assistance (pt attempted to perform hygiene but unable to reach) Toileting - Architect and Hygiene: Patient Percentage: 40% Where Assessed - Toileting Clothing Manipulation and Hygiene: Sit to stand from 3-in-1 or toilet Equipment Used: Rolling walker ADL Comments: Pt open to considering rehab at d/c and will discuss wtih husband. pt having difficulty with reaching posterior periarea for performing hygiene after urinating and unable to reach from the front due to body shape. Tried to straddle legs alittle in standing but still unable. Discussed toilet aid but pt stating "that's gross" and has seen toilet aid before. Encouraged her to consider toilet aid so she can perform hygiene more independently.     OT Diagnosis: Generalized weakness;Acute pain  OT Problem List: Decreased strength;Pain;Decreased knowledge of use of DME or AE OT Treatment Interventions: Self-care/ADL training;Therapeutic activities;DME and/or AE instruction;Patient/family education   OT Goals Acute Rehab OT Goals OT Goal Formulation: With patient Time For Goal Achievement: 02/08/13 Potential to Achieve Goals: Good ADL Goals Pt Will Perform Grooming: with supervision;Standing at sink ADL Goal: Grooming - Progress: Goal set today Pt Will Perform Lower Body Bathing: with mod assist;Sit to stand from chair;Sit to stand from bed;with adaptive equipment ADL Goal: Lower Body Bathing - Progress: Goal set today Pt Will Perform Lower Body Dressing: with mod assist;with adaptive equipment;Sit to stand from bed;Sit to stand from chair ADL Goal: Lower Body Dressing - Progress: Goal set today Pt Will Transfer to Toilet: with min assist;Ambulation;3-in-1 ADL Goal: Toilet Transfer - Progress: Goal set today Pt Will Perform Toileting - Hygiene: with min assist;with adaptive  equipment ADL Goal: Toileting - Hygiene - Progress: Goal set today  Visit Information  Last OT Received On: 02/01/13 Assistance Needed: +2 (more  for safety today) PT/OT Co-Evaluation/Treatment: Yes    Subjective Data  Subjective: I need to try to sit on that commode Patient Stated Goal: wants to get up to commode   Prior Functioning     Home Living Lives With: Spouse Available Help at Discharge: Family;Available 24 hours/day Type of Home: House Home Access: Stairs to enter [employer] of Steps: 1 then 1 Entrance Stairs-Rails: None Home Layout: One level Bathroom Shower/Tub: Engineer, manufacturing systems: Handicapped height Home Adaptive Equipment: Walker - rolling;Bedside commode/3-in-1;Straight cane Additional Comments: pt states she is pretty sure she has 3in1 but would have to confirm Prior Function Level of Independence: Independent with assistive device(s) Driving: Yes Vocation: Retired Musician: No difficulties         Vision/Perception     Copywriter, advertising Arousal/Alertness: Awake/alert Behavior During Therapy: WFL for tasks assessed/performed Overall Cognitive Status: Within Functional Limits for tasks assessed    Extremity/Trunk Assessment Right Upper Extremity Assessment RUE ROM/Strength/Tone: WFL for tasks assessed (UEs do fatigue with weightbearing on RW) Left Upper Extremity Assessment LUE ROM/Strength/Tone: WFL for tasks assessed     Mobility Bed Mobility Bed Mobility: Not assessed Details for Bed Mobility Assistance: up in chair Transfers Transfers: Sit to Stand;Stand to Sit Sit to Stand: 1: +2 Total assist;With upper extremity assist;From chair/3-in-1 Sit to Stand: Patient Percentage: 80% Stand to Sit: 1: +2 Total assist;To chair/3-in-1 Stand to Sit: Patient Percentage: 80% Details for Transfer Assistance: vebal cues for hand placement and extend R LE out in front before standing/sitting. Did need  assist to support R LE and help wtih trunk as she sat up from reclined position in chair.      Exercise Total Joint Exercises Ankle Circles/Pumps: AROM;Both;20 reps   Balance     End of Session OT - End of Session Equipment Utilized During Treatment: Gait belt Activity Tolerance: Patient limited by pain Patient left: in chair;with call bell/phone within reach CPM Right Knee CPM Right Knee: Off  GO     [facility]enn[facility] Laity [phone] 02/01/2013, 10:51 AM

## 2013-02-02 LAB — CBC
HCT: 26.2 % — ABNORMAL LOW (ref 36.0–46.0)
MCH: 34.2 pg — ABNORMAL HIGH (ref 26.0–34.0)
MCV: 99.6 fL (ref 78.0–100.0)
Platelets: 130 10*3/uL — ABNORMAL LOW (ref 150–400)
RDW: 13.7 % (ref 11.5–15.5)

## 2013-02-02 MED ORDER — OXYCODONE-ACETAMINOPHEN 5-325 MG PO TABS
1.0000 | ORAL_TABLET | ORAL | Status: DC | PRN
  Administered 2013-02-02: 1 via ORAL
  Administered 2013-02-02: 2 via ORAL
  Filled 2013-02-02: qty 1
  Filled 2013-02-02: qty 2

## 2013-02-02 MED ORDER — INSULIN ASPART 100 UNIT/ML ~~LOC~~ SOLN: 0.0000 [IU] | Freq: Every day | SUBCUTANEOUS | Status: DC

## 2013-02-02 MED ORDER — RIVAROXABAN 10 MG PO TABS: 10.0000 mg | ORAL_TABLET | Freq: Every day | ORAL | Status: DC

## 2013-02-02 MED ORDER — INSULIN ASPART 100 UNIT/ML ~~LOC~~ SOLN: 0.0000 [IU] | Freq: Three times a day (TID) | SUBCUTANEOUS | Status: DC

## 2013-02-02 MED ORDER — METHOCARBAMOL 500 MG PO TABS: 500.0000 mg | ORAL_TABLET | Freq: Four times a day (QID) | ORAL | Status: DC | PRN

## 2013-02-02 MED ORDER — OXYCODONE-ACETAMINOPHEN 5-325 MG PO TABS: 1.0000 | ORAL_TABLET | ORAL | Status: DC | PRN

## 2013-02-02 NOTE — Progress Notes (Signed)
Patient ID: Amy Burns, female   DOB: 11/02/1940, 72 y.o.   MRN: [id number] She remains afebrile.  Comfortable on po meds.  Hgb stable at 9.0.  Will transfer to [facility] today.

## 2013-02-02 NOTE — Discharge Summary (Signed)
Amy Burns, DAO NO.:  [id number]  MEDICAL RECORD NO.:  [id number]  LOCATION:  1618                         FACILITY:  [facility]  PHYSICIAN:  Marlowe Kays, M.D.  DATE OF BIRTH:  04/20/1941  DATE OF ADMISSION:  01/30/2013 DATE OF DISCHARGE:  02/02/2013                              DISCHARGE SUMMARY   ADMITTING DIAGNOSES: 1. End-stage osteoarthritis, right knee. 2. Status post total knee replacement, left. 3. Morbid obesity. 4. Postoperative surgical anemia.  OPERATION:  On 01/30/2013, total knee replacement, right.  SUMMARY:  This lady is a longtime patient of mine.  She has had successful total knee replacement in the left years ago.  She has noted progressive pain and deformity in right knee and now on x-ray has bone- on-bone abutment medial knee.  Hospital course, pre-admission laboratory data was satisfactory.  The operation itself went uneventfully.  She has done well postoperatively.  Hemoglobin is stable at 9.0.  She is ambulatory with weightbearing as tolerated on her right leg.  Plan is to transfer to [facility] today where she will continue her physical therapy with weightbearing as tolerated in the right leg.  She may be showering, but not immersing the leg if there is no drainage.  I examined her wound yesterday and redressed it and it was clean and dry. We will be sending home on her admission medications as well as Xarelto 10 mg daily and Norco 7.5/325, one to two every 4-6 hours p.r.n. for pain.  Plan will be to have her return to my office roughly 2 weeks from surgery with call number for appointment [phone].  CONDITION AT DISCHARGE:  Stable and improved.          ______________________________ Marlowe Kays, M.D.     JA/MEDQ  D:  02/02/2013  T:  02/02/2013  Job:  [id number]

## 2013-02-03 NOTE — Progress Notes (Signed)
Clinical Social Work Department CLINICAL SOCIAL WORK PLACEMENT NOTE 02/03/2013  Patient:  Amy Burns, Amy Burns  Account Number:  [id number] Admit date:  01/30/2013  Clinical Social Worker:  Cori Razor, LCSW  Date/time:  01/31/2013 02:03 PM  Clinical Social Work is seeking post-discharge placement for this patient at the following level of care:   SKILLED NURSING   (*CSW will update this form in Epic as items are completed)   01/31/2013  Patient/family provided with [facility] Department of Clinical Social Work's list of facilities offering this level of care within the geographic area requested by the patient (or if unable, by the patient's family).  01/31/2013  Patient/family informed of their freedom to choose among providers that offer the needed level of care, that participate in Medicare, Medicaid or managed care program needed by the patient, have an available bed and are willing to accept the patient.    Patient/family informed of [facility]' ownership interest in [facility], as well as of the fact that they are under no obligation to receive care at this facility.  PASARR submitted to EDS on 01/31/2013 PASARR number received from EDS on 01/31/2013  FL2 transmitted to all facilities in geographic area requested by pt/family on  01/31/2013 FL2 transmitted to all facilities within larger geographic area on   Patient informed that his/her managed care company has contracts with or will negotiate with  certain facilities, including the following:     Patient/family informed of bed offers received:  02/01/2013 Patient chooses bed at [facility] Physician recommends and patient chooses bed at    Patient to be transferred to [facility] on  02/02/2013 Patient to be transferred to facility by   The following physician request were entered in Epic:   Additional Comments:  Cori Razor LCSW [phone]

## 2013-02-15 ENCOUNTER — Ambulatory Visit: Payer: Medicare Other | Admitting: Family Medicine

## 2013-03-22 ENCOUNTER — Other Ambulatory Visit: Payer: Self-pay | Admitting: Family Medicine

## 2013-04-27 ENCOUNTER — Encounter: Payer: Self-pay | Admitting: Family Medicine

## 2013-04-27 ENCOUNTER — Ambulatory Visit (INDEPENDENT_AMBULATORY_CARE_PROVIDER_SITE_OTHER): Payer: Medicare Other | Admitting: Family Medicine

## 2013-04-27 VITALS — BP 132/64 | HR 102 | Temp 97.9°F | Wt 295.0 lb

## 2013-04-27 DIAGNOSIS — I10 Essential (primary) hypertension: Secondary | ICD-10-CM

## 2013-04-27 DIAGNOSIS — E039 Hypothyroidism, unspecified: Secondary | ICD-10-CM

## 2013-04-27 DIAGNOSIS — D62 Acute posthemorrhagic anemia: Secondary | ICD-10-CM

## 2013-04-27 DIAGNOSIS — E119 Type 2 diabetes mellitus without complications: Secondary | ICD-10-CM

## 2013-04-27 LAB — CBC WITH DIFFERENTIAL/PLATELET
Basophils Absolute: 0 10*3/uL (ref 0.0–0.1)
Eosinophils Relative: 2.5 % (ref 0.0–5.0)
HCT: 34.2 % — ABNORMAL LOW (ref 36.0–46.0)
Hemoglobin: 11.5 g/dL — ABNORMAL LOW (ref 12.0–15.0)
Lymphs Abs: 1.6 10*3/uL (ref 0.7–4.0)
MCV: 91.3 fl (ref 78.0–100.0)
Monocytes Absolute: 0.6 10*3/uL (ref 0.1–1.0)
Monocytes Relative: 13.1 % — ABNORMAL HIGH (ref 3.0–12.0)
Neutro Abs: 2.5 10*3/uL (ref 1.4–7.7)
Platelets: 212 10*3/uL (ref 150.0–400.0)
RDW: 14.6 % (ref 11.5–14.6)

## 2013-04-27 LAB — HEMOGLOBIN A1C: Hgb A1c MFr Bld: 6.4 % (ref 4.6–6.5)

## 2013-04-27 LAB — HM DIABETES FOOT EXAM: HM Diabetic Foot Exam: NORMAL

## 2013-04-27 NOTE — Progress Notes (Signed)
Subjective:    Patient ID: Amy Burns, female    DOB: 16-Jul-1941, 72 y.o.   MRN: [id number]  HPI Patient has morbid obesity. She had recent right total knee replacement and apparently did remarkably well. She had previous left total knee replacement. By description, she apparently had some cellulitis of the right leg following her surgery. She was in rehabilitation facility for only 4 days. She is ambulating without difficulty, unassisted at this time. Occasional use of cane. She was treated with xarelto for 10 days following her surgery  Blood sugars have been well controlled. She is not monitoring currently. No proximal symptoms of polyuria or polydipsia She has still not set up eye exam yet.  Patient had postoperative anemia with hemoglobin 9.0. No dizziness. No orthostasis. She's not taking any iron replacement.  Hypertension treated with lisinopril. We had added low-dose metoprolol prior to her surgery because of some mild tachycardia with normal TSH. She apparently did not remain on this following her surgery. No chest pains. No dyspnea.  Past Medical History  Diagnosis Date  . HYPOTHYROIDISM 12/11/2008  . 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 HYPERTENSION 12/11/2008  . OSTEOARTHRITIS 12/11/2008  . ALOPECIA 12/11/2008  . Shortness of breath   . Pulmonary embolism     bilateral  . Orthopnea   . Measles     as child  . History of chicken pox     as child  . Cancer     left breast  . Sleep apnea     can not wear CPAP  . Headache(784.0)   . Complication of anesthesia     "woke up after surgery and could not breath"   Past Surgical History  Procedure Laterality Date  . Thyroidectomy, partial  2002  . Breast surgery  2007    cancer  . Knee surgery  2009    left  . Total knee arthroplasty Right 01/30/2013    Procedure: RIGHT TOTAL KNEE ARTHROPLASTY;  Surgeon: Drucilla Schmidt, MD;  Location: [facility];  Service: Orthopedics;  Laterality: Right;    reports that she has never smoked. She has  never used smokeless tobacco. She reports that she does not drink alcohol or use illicit drugs. family history includes Cancer in her maternal aunt; Heart disease (age of onset: 15) in her father. Allergies  Allergen Reactions  . Meloxicam     REACTION: itiching      Review of Systems  Constitutional: Negative for fatigue and unexpected weight change.  Eyes: Negative for visual disturbance.  Respiratory: Negative for cough, chest tightness, shortness of breath and wheezing.   Cardiovascular: Negative for chest pain, palpitations and leg swelling.  Endocrine: Negative for polydipsia and polyuria.  Genitourinary: Negative for dysuria.  Neurological: Negative for dizziness, seizures, syncope, weakness, light-headedness and headaches.       Objective:   Physical Exam  Constitutional: She appears well-developed and well-nourished.  Neck: Neck supple. No thyromegaly present.  Cardiovascular: Normal rate.   She has occasional premature beats  Pulmonary/Chest: Effort normal and breath sounds normal. No respiratory distress. She has no wheezes. She has no rales.  Musculoskeletal: She exhibits edema.  She has only trace edema legs bilaterally. No cellulitis changes. Foot exam is normal bilaterally          Assessment & Plan:  #1 type 2 diabetes. Recheck A1c. If at goal, go to every 6 months. She is again encouraged to schedule eye exam. No urine microalbumin since she is on ACE inhibitor #2 hypertension.  Stable. Patient was recently on low-dose metoprolol but pulses stable today around 90 and blood pressure well controlled so she will leave this off since she's been off this for the past 4 or 5 days #3 postoperative anemia. No orthostasis. Recheck CBC #4 health maintenance. Reminder for flu vaccine. Pneumovax up-to-date.

## 2013-04-27 NOTE — Patient Instructions (Addendum)
° °  Iron-Rich Diet ° °An iron-rich diet contains foods that are good sources of iron. Iron is an important mineral that helps your body produce hemoglobin. Hemoglobin is a protein in red blood cells that carries oxygen to the body's tissues. Sometimes, the iron level in your blood can be low. This may be caused by: °· A lack of iron in your diet. °· Blood loss. °· Times of growth, such as during pregnancy or during a child's growth and development. °Low levels of iron can cause a decrease in the number of red blood cells. This can result in iron deficiency anemia. Iron deficiency anemia symptoms include: °· Tiredness. °· Weakness. °· Irritability. °· Increased chance of infection. °Here are some recommendations for daily iron intake: °· Males older than 73 years of age need 8 mg of iron per day. °· Women ages 19 to 50 need 18 mg of iron per day. °· Pregnant women need 27 mg of iron per day, and women who are over 19 years of age and breastfeeding need 9 mg of iron per day. °· Women over the age of 50 need 8 mg of iron per day. °SOURCES OF IRON °There are 2 types of iron that are found in food: heme iron and nonheme iron. Heme iron is absorbed by the body better than nonheme iron. Heme iron is found in meat, poultry, and fish. Nonheme iron is found in grains, beans, and vegetables. °Heme Iron Sources °Food / Iron (mg) °· Chicken liver, 3 oz (85 g)/ 10 mg °· Beef liver, 3 oz (85 g)/ 5.5 mg °· Oysters, 3 oz (85 g)/ 8 mg °· Beef, 3 oz (85 g)/ 2 to 3 mg °· Shrimp, 3 oz (85 g)/ 2.8 mg °· Turkey, 3 oz (85 g)/ 2 mg °· Chicken, 3 oz (85 g) / 1 mg °· Fish (tuna, halibut), 3 oz (85 g)/ 1 mg °· Pork, 3 oz (85 g)/ 0.9 mg °Nonheme Iron Sources °Food / Iron (mg) °· Ready-to-eat breakfast cereal, iron-fortified / 3.9 to 7 mg °· Tofu, ½ cup / 3.4 mg °· Kidney beans, ½ cup / 2.6 mg °· Baked potato with skin / 2.7 mg °· Asparagus, ½ cup / 2.2 mg °· Avocado / 2 mg °· Dried peaches, ½ cup / 1.6 mg °· Raisins, ½ cup / 1.5 mg °· Soy milk,  1 cup / 1.5 mg °· Whole-wheat bread, 1 slice / 1.2 mg °· Spinach, 1 cup / 0.8 mg °· Broccoli, ½ cup / 0.6 mg °IRON ABSORPTION °Certain foods can decrease the body's absorption of iron. Try to avoid these foods and beverages while eating meals with iron-containing foods: °· Coffee. °· Tea. °· Fiber. °· Soy. °Foods containing vitamin C can help increase the amount of iron your body absorbs from iron sources, especially from nonheme sources. Eat foods with vitamin C along with iron-containing foods to increase your iron absorption. Foods that are high in vitamin C include many fruits and vegetables. Some good sources are: °· Fresh orange juice. °· Oranges. °· Strawberries. °· Mangoes. °· Grapefruit. °· Red bell peppers. °· Green bell peppers. °· Broccoli. °· Potatoes with skin. °· Tomato juice. °Document Released: 04/06/2005 Document Revised: 11/15/2011 Document Reviewed: 02/11/2011 °ExitCare® Patient Information ©2014 [employer]. ° °

## 2013-05-11 ENCOUNTER — Telehealth: Payer: Self-pay | Admitting: Family Medicine

## 2013-05-11 NOTE — Telephone Encounter (Signed)
Only see that the patient is taking the Lisinopril and in at her last visit the metoprolol was just for a few days.

## 2013-05-11 NOTE — Telephone Encounter (Signed)
She is NOT taking the metoprolol and would not start back at this time

## 2013-05-11 NOTE — Telephone Encounter (Signed)
Pt states her pharm called her to inform she had a refill of metoprolol succinate (TOPROL-XL) 25 MG 24 hr tablet  Pt not sure if she is on this med. Thinks she as been changed to lisinopril (PRINIVIL,ZESTRIL) 40 MG tablet.  Pt will wait to hear from you before she opens this RX. pls advise.

## 2013-05-11 NOTE — Telephone Encounter (Signed)
Called patient home phone only rings one time and hangs up. Cell phone no answer and VM not set up

## 2013-05-14 MED ORDER — LISINOPRIL 40 MG PO TABS: 40.0000 mg | ORAL_TABLET | Freq: Every morning | ORAL | Status: DC

## 2013-05-14 NOTE — Telephone Encounter (Signed)
Sent lisinopril refills to pharm

## 2013-05-29 ENCOUNTER — Other Ambulatory Visit: Payer: Self-pay | Admitting: Family Medicine

## 2013-09-13 ENCOUNTER — Inpatient Hospital Stay (HOSPITAL_COMMUNITY)
Admission: EM | Admit: 2013-09-13 | Discharge: 2013-09-26 | DRG: 463 | Disposition: A | Discharge status: Skilled Nursing Facility | Payer: Medicare Other | Attending: Internal Medicine | Admitting: Internal Medicine

## 2013-09-13 ENCOUNTER — Emergency Department (HOSPITAL_COMMUNITY): Payer: Medicare Other

## 2013-09-13 ENCOUNTER — Encounter (HOSPITAL_COMMUNITY): Payer: Self-pay | Admitting: Emergency Medicine

## 2013-09-13 DIAGNOSIS — M25561 Pain in right knee: Secondary | ICD-10-CM

## 2013-09-13 DIAGNOSIS — M25579 Pain in unspecified ankle and joints of unspecified foot: Secondary | ICD-10-CM | POA: Diagnosis present

## 2013-09-13 DIAGNOSIS — Z96659 Presence of unspecified artificial knee joint: Secondary | ICD-10-CM

## 2013-09-13 DIAGNOSIS — D638 Anemia in other chronic diseases classified elsewhere: Secondary | ICD-10-CM | POA: Diagnosis present

## 2013-09-13 DIAGNOSIS — L659 Nonscarring hair loss, unspecified: Secondary | ICD-10-CM

## 2013-09-13 DIAGNOSIS — Z86711 Personal history of pulmonary embolism: Secondary | ICD-10-CM

## 2013-09-13 DIAGNOSIS — I509 Heart failure, unspecified: Secondary | ICD-10-CM | POA: Diagnosis present

## 2013-09-13 DIAGNOSIS — M79609 Pain in unspecified limb: Secondary | ICD-10-CM

## 2013-09-13 DIAGNOSIS — R21 Rash and other nonspecific skin eruption: Secondary | ICD-10-CM

## 2013-09-13 DIAGNOSIS — I10 Essential (primary) hypertension: Secondary | ICD-10-CM | POA: Diagnosis present

## 2013-09-13 DIAGNOSIS — Z853 Personal history of malignant neoplasm of breast: Secondary | ICD-10-CM

## 2013-09-13 DIAGNOSIS — Z113 Encounter for screening for infections with a predominantly sexual mode of transmission: Secondary | ICD-10-CM

## 2013-09-13 DIAGNOSIS — Z8249 Family history of ischemic heart disease and other diseases of the circulatory system: Secondary | ICD-10-CM

## 2013-09-13 DIAGNOSIS — G4730 Sleep apnea, unspecified: Secondary | ICD-10-CM | POA: Diagnosis present

## 2013-09-13 DIAGNOSIS — Y831 Surgical operation with implant of artificial internal device as the cause of abnormal reaction of the patient, or of later complication, without mention of misadventure at the time of the procedure: Secondary | ICD-10-CM | POA: Diagnosis present

## 2013-09-13 DIAGNOSIS — L259 Unspecified contact dermatitis, unspecified cause: Secondary | ICD-10-CM | POA: Diagnosis not present

## 2013-09-13 DIAGNOSIS — N170 Acute kidney failure with tubular necrosis: Secondary | ICD-10-CM | POA: Diagnosis not present

## 2013-09-13 DIAGNOSIS — I5033 Acute on chronic diastolic (congestive) heart failure: Secondary | ICD-10-CM | POA: Diagnosis not present

## 2013-09-13 DIAGNOSIS — N179 Acute kidney failure, unspecified: Secondary | ICD-10-CM | POA: Diagnosis not present

## 2013-09-13 DIAGNOSIS — Z6841 Body Mass Index (BMI) 40.0 and over, adult: Secondary | ICD-10-CM

## 2013-09-13 DIAGNOSIS — L270 Generalized skin eruption due to drugs and medicaments taken internally: Secondary | ICD-10-CM | POA: Diagnosis not present

## 2013-09-13 DIAGNOSIS — T8459XA Infection and inflammatory reaction due to other internal joint prosthesis, initial encounter: Secondary | ICD-10-CM

## 2013-09-13 DIAGNOSIS — M25569 Pain in unspecified knee: Secondary | ICD-10-CM | POA: Diagnosis present

## 2013-09-13 DIAGNOSIS — R82998 Other abnormal findings in urine: Secondary | ICD-10-CM | POA: Diagnosis not present

## 2013-09-13 DIAGNOSIS — T8450XA Infection and inflammatory reaction due to unspecified internal joint prosthesis, initial encounter: Principal | ICD-10-CM | POA: Diagnosis present

## 2013-09-13 DIAGNOSIS — Z794 Long term (current) use of insulin: Secondary | ICD-10-CM

## 2013-09-13 DIAGNOSIS — M009 Pyogenic arthritis, unspecified: Secondary | ICD-10-CM | POA: Diagnosis present

## 2013-09-13 DIAGNOSIS — D62 Acute posthemorrhagic anemia: Secondary | ICD-10-CM | POA: Diagnosis not present

## 2013-09-13 DIAGNOSIS — T8453XA Infection and inflammatory reaction due to internal right knee prosthesis, initial encounter: Secondary | ICD-10-CM | POA: Diagnosis present

## 2013-09-13 DIAGNOSIS — T368X5A Adverse effect of other systemic antibiotics, initial encounter: Secondary | ICD-10-CM | POA: Diagnosis not present

## 2013-09-13 DIAGNOSIS — E039 Hypothyroidism, unspecified: Secondary | ICD-10-CM | POA: Diagnosis present

## 2013-09-13 DIAGNOSIS — A4902 Methicillin resistant Staphylococcus aureus infection, unspecified site: Secondary | ICD-10-CM | POA: Diagnosis present

## 2013-09-13 DIAGNOSIS — Z7982 Long term (current) use of aspirin: Secondary | ICD-10-CM

## 2013-09-13 DIAGNOSIS — M1990 Unspecified osteoarthritis, unspecified site: Secondary | ICD-10-CM

## 2013-09-13 DIAGNOSIS — E119 Type 2 diabetes mellitus without complications: Secondary | ICD-10-CM

## 2013-09-13 DIAGNOSIS — D72829 Elevated white blood cell count, unspecified: Secondary | ICD-10-CM | POA: Diagnosis present

## 2013-09-13 LAB — POCT I-STAT, CHEM 8
BUN: 31 mg/dL — ABNORMAL HIGH (ref 6–23)
Calcium, Ion: 1.04 mmol/L — ABNORMAL LOW (ref 1.13–1.30)
Chloride: 97 mEq/L (ref 96–112)
Creatinine, Ser: 1.2 mg/dL — ABNORMAL HIGH (ref 0.50–1.10)
Glucose, Bld: 123 mg/dL — ABNORMAL HIGH (ref 70–99)
HCT: 33 % — ABNORMAL LOW (ref 36.0–46.0)
Hemoglobin: 11.2 g/dL — ABNORMAL LOW (ref 12.0–15.0)
Potassium: 3.8 mEq/L (ref 3.7–5.3)
Sodium: 132 mEq/L — ABNORMAL LOW (ref 137–147)
TCO2: 20 mmol/L (ref 0–100)

## 2013-09-13 LAB — CBC WITH DIFFERENTIAL/PLATELET
Basophils Absolute: 0 10*3/uL (ref 0.0–0.1)
Basophils Relative: 0 % (ref 0–1)
Eosinophils Absolute: 0 10*3/uL (ref 0.0–0.7)
Eosinophils Relative: 0 % (ref 0–5)
HCT: 30.1 % — ABNORMAL LOW (ref 36.0–46.0)
Hemoglobin: 10.2 g/dL — ABNORMAL LOW (ref 12.0–15.0)
LYMPHS ABS: 0.8 10*3/uL (ref 0.7–4.0)
LYMPHS PCT: 6 % — AB (ref 12–46)
MCH: 30.4 pg (ref 26.0–34.0)
MCHC: 33.9 g/dL (ref 30.0–36.0)
MCV: 89.9 fL (ref 78.0–100.0)
Monocytes Absolute: 1.4 10*3/uL — ABNORMAL HIGH (ref 0.1–1.0)
Monocytes Relative: 10 % (ref 3–12)
NEUTROS ABS: 12.4 10*3/uL — AB (ref 1.7–7.7)
NEUTROS PCT: 85 % — AB (ref 43–77)
PLATELETS: 207 10*3/uL (ref 150–400)
RBC: 3.35 MIL/uL — AB (ref 3.87–5.11)
RDW: 15.1 % (ref 11.5–15.5)
WBC: 14.6 10*3/uL — AB (ref 4.0–10.5)

## 2013-09-13 LAB — C-REACTIVE PROTEIN: CRP: 28.4 mg/dL — AB (ref <0.60)

## 2013-09-13 LAB — GLUCOSE, CAPILLARY: Glucose-Capillary: 128 mg/dL — ABNORMAL HIGH (ref 70–99)

## 2013-09-13 LAB — SEDIMENTATION RATE: SED RATE: 107 mm/h — AB (ref 0–22)

## 2013-09-13 MED ORDER — HYDROMORPHONE HCL PF 1 MG/ML IJ SOLN
1.0000 mg | Freq: Once | INTRAMUSCULAR | Status: AC
  Administered 2013-09-13: 1 mg via INTRAVENOUS
  Filled 2013-09-13: qty 1

## 2013-09-13 MED ORDER — ASPIRIN EC 81 MG PO TBEC
81.0000 mg | DELAYED_RELEASE_TABLET | Freq: Every day | ORAL | Status: DC
  Administered 2013-09-14 – 2013-09-20 (×6): 81 mg via ORAL
  Filled 2013-09-13 (×8): qty 1

## 2013-09-13 MED ORDER — DEXTROSE 5 % IV SOLN
1.0000 g | Freq: Three times a day (TID) | INTRAVENOUS | Status: DC
  Administered 2013-09-13 – 2013-09-14 (×3): 1 g via INTRAVENOUS
  Filled 2013-09-13 (×6): qty 1

## 2013-09-13 MED ORDER — INSULIN ASPART 100 UNIT/ML ~~LOC~~ SOLN
0.0000 [IU] | Freq: Three times a day (TID) | SUBCUTANEOUS | Status: DC
  Administered 2013-09-14 – 2013-09-25 (×20): 1 [IU] via SUBCUTANEOUS
  Administered 2013-09-26: 2 [IU] via SUBCUTANEOUS

## 2013-09-13 MED ORDER — ONDANSETRON HCL 4 MG PO TABS: 4.0000 mg | ORAL_TABLET | Freq: Four times a day (QID) | ORAL | Status: DC | PRN

## 2013-09-13 MED ORDER — ONDANSETRON HCL 4 MG/2ML IJ SOLN
4.0000 mg | Freq: Once | INTRAMUSCULAR | Status: AC
  Administered 2013-09-13: 4 mg via INTRAVENOUS
  Filled 2013-09-13: qty 2

## 2013-09-13 MED ORDER — HYDROMORPHONE HCL PF 1 MG/ML IJ SOLN
1.0000 mg | INTRAMUSCULAR | Status: DC | PRN
  Administered 2013-09-13 – 2013-09-20 (×3): 1 mg via INTRAVENOUS
  Filled 2013-09-13 (×4): qty 1

## 2013-09-13 MED ORDER — PIPERACILLIN-TAZOBACTAM 3.375 G IVPB 30 MIN
3.3750 g | INTRAVENOUS | Status: DC
  Filled 2013-09-13: qty 50

## 2013-09-13 MED ORDER — HYDROCODONE-ACETAMINOPHEN 5-325 MG PO TABS
1.0000 | ORAL_TABLET | ORAL | Status: DC | PRN
  Administered 2013-09-15: 2 via ORAL
  Administered 2013-09-15 – 2013-09-17 (×4): 1 via ORAL
  Administered 2013-09-17 – 2013-09-20 (×6): 2 via ORAL
  Administered 2013-09-20: 1 via ORAL
  Administered 2013-09-21 – 2013-09-23 (×5): 2 via ORAL
  Administered 2013-09-23: 1 via ORAL
  Administered 2013-09-23 – 2013-09-25 (×4): 2 via ORAL
  Filled 2013-09-13: qty 2
  Filled 2013-09-13: qty 1
  Filled 2013-09-13 (×8): qty 2
  Filled 2013-09-13: qty 1
  Filled 2013-09-13: qty 2
  Filled 2013-09-13: qty 1
  Filled 2013-09-13 (×2): qty 2
  Filled 2013-09-13: qty 1
  Filled 2013-09-13 (×4): qty 2
  Filled 2013-09-13: qty 1
  Filled 2013-09-13 (×2): qty 2

## 2013-09-13 MED ORDER — VANCOMYCIN HCL 10 G IV SOLR
2500.0000 mg | Freq: Once | INTRAVENOUS | Status: AC
  Administered 2013-09-13: 2500 mg via INTRAVENOUS
  Filled 2013-09-13 (×3): qty 2500

## 2013-09-13 MED ORDER — LEVOTHYROXINE SODIUM 125 MCG PO TABS
125.0000 ug | ORAL_TABLET | Freq: Every day | ORAL | Status: DC
  Administered 2013-09-14 – 2013-09-26 (×12): 125 ug via ORAL
  Filled 2013-09-13 (×15): qty 1

## 2013-09-13 MED ORDER — VANCOMYCIN HCL 10 G IV SOLR
1750.0000 mg | INTRAVENOUS | Status: DC
  Administered 2013-09-14 – 2013-09-15 (×2): 1750 mg via INTRAVENOUS
  Filled 2013-09-13 (×3): qty 1750

## 2013-09-13 MED ORDER — PIPERACILLIN-TAZOBACTAM 3.375 G IVPB: 3.3750 g | Freq: Three times a day (TID) | INTRAVENOUS | Status: DC

## 2013-09-13 MED ORDER — ASPIRIN 81 MG PO TABS: 81.0000 mg | ORAL_TABLET | Freq: Every day | ORAL | Status: DC

## 2013-09-13 MED ORDER — ENOXAPARIN SODIUM 80 MG/0.8ML ~~LOC~~ SOLN
70.0000 mg | SUBCUTANEOUS | Status: AC
  Administered 2013-09-13 – 2013-09-16 (×4): 70 mg via SUBCUTANEOUS
  Filled 2013-09-13 (×5): qty 0.8

## 2013-09-13 MED ORDER — ONDANSETRON HCL 4 MG/2ML IJ SOLN
4.0000 mg | Freq: Four times a day (QID) | INTRAMUSCULAR | Status: DC | PRN
  Administered 2013-09-15: 4 mg via INTRAVENOUS
  Filled 2013-09-13: qty 2

## 2013-09-13 NOTE — Progress Notes (Signed)
ANTIBIOTIC CONSULT NOTE - INITIAL  Pharmacy Consult for Vancomycin, Zosyn Indication: PJI  Allergies  Allergen Reactions  . Meloxicam     REACTION: itiching    Patient Measurements: Weight = 136.1kg Height 5'2"   Vital Signs: Temp: 98.6 F (37 C) (01/08 1029) Temp src: Oral (01/08 1029) BP: 145/52 mmHg (01/08 1029) Pulse Rate: 105 (01/08 1029) Intake/Output from previous day:   Intake/Output from this shift:    Labs:  Recent Labs  09/13/13 1230 09/13/13 1252  WBC 14.6*  --   HGB 10.2* 11.2*  PLT 207  --   CREATININE  --  1.20*   The CrCl is unknown because both a height and weight (above a minimum accepted value) are required for this calculation. No results found for this basename: VANCOTROUGH, VANCOPEAK, VANCORANDOM, GENTTROUGH, GENTPEAK, GENTRANDOM, TOBRATROUGH, TOBRAPEAK, TOBRARND, AMIKACINPEAK, AMIKACINTROU, AMIKACIN,  in the last 72 hours   Microbiology: No results found for this or any previous visit (from the past 720 hour(s)).  Medical History: Past Medical History  Diagnosis Date  . HYPOTHYROIDISM 12/11/2008  . 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 HYPERTENSION 12/11/2008  . OSTEOARTHRITIS 12/11/2008  . ALOPECIA 12/11/2008  . Shortness of breath   . Pulmonary embolism     bilateral  . Orthopnea   . Measles     as child  . History of chicken pox     as child  . Cancer     left breast  . Sleep apnea     can not wear CPAP  . Headache(784.0)   . Complication of anesthesia     "woke up after surgery and could not breath"   Assessment: 80 yoF with PMHx significant for BL knee replacements presents with progressively worsening R knee and ankle pain.   RLE dopplers negative for DVT.  Pharmacy consulted to dose vancomycin and cefepime for probable septic arthritis/PJI.   D1 Antibiotics 1/8 >> Vancomycin >> 1/8 >> Cefepime >>  Tm24h: 98.6 WBC: Elevated at 14.6 Renal: SCr 1.20, CrCl ~56 ml/min (N47)  Weight = 136kg  Microbiology: None  Goal of Therapy:   Vancomycin trough level 15-20 mcg/ml Eradication of infection  Plan:  Vancomycin 2500mg  IV x 1, then 1750mg  IV q 24 hours Cefepime 1 gram IV q 8 hours F/u renal function, vancomycin trough at Css, clinical course  Ralene Bathe, PharmD, BCPS 09/13/2013, 5:19 PM  Pager: [phone]

## 2013-09-13 NOTE — ED Provider Notes (Signed)
CSN: [id number]     Arrival date & time 09/13/13  1022 History   First MD Initiated Contact with Patient 09/13/13 1045     Chief Complaint  Patient presents with  . Knee Pain    right   (Consider location/radiation/quality/duration/timing/severity/associated sxs/prior Treatment) HPI Comments: Patient is 73 year old obese female who presents to the ED with complaint of progressively worsening right knee and ankle pain - she reports bilateral knee replacements by Dr. Gladstone Lighter several years ago - she states that she knows that she has arthritis in her right ankle but that over the past 4 days her right knee has hurt.  She reports increase in redness and swelling and is now at the point that she is unable to ambulate without assistance.  She states that her niece who is a nurse came to see her and was concerned for either infection in the joint or blood clot.  She states that she could see Dr. Charlestine Night PA this afternoon but she was concerned that she would not be able to walk the whole way there.  Her husband has reported that she sat on the toilet for 5 hours last night because they could not get her up because of the pain.  Patient is a 73 y.o. female presenting with knee pain. The history is provided by the patient and the spouse. No language interpreter was used.  Knee Pain Location:  Leg, knee and ankle Time since incident:  4 days Injury: no   Leg location:  R lower leg Knee location:  R knee Ankle location:  R ankle Pain details:    Quality:  Aching and throbbing   Radiates to:  Does not radiate   Severity:  Severe   Onset quality:  Gradual   Duration:  4 days   Timing:  Constant   Progression:  Worsening Chronicity:  New Dislocation: no   Foreign body present:  No foreign bodies Tetanus status:  Up to date Prior injury to area:  Yes Relieved by:  Nothing Worsened by:  Activity and bearing weight Ineffective treatments:  None tried Associated symptoms: decreased ROM, muscle  weakness and swelling   Associated symptoms: no back pain, no fatigue, no fever, no numbness and no stiffness   Risk factors: obesity   Knee Pain  The quality of the pain is described as aching and throbbing. The pain is severe. Associated symptoms include muscle weakness. She reports no foreign bodies present. The symptoms are aggravated by activity and bearing weight.    Past Medical History  Diagnosis Date  . HYPOTHYROIDISM 12/11/2008  . 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 HYPERTENSION 12/11/2008  . OSTEOARTHRITIS 12/11/2008  . ALOPECIA 12/11/2008  . Shortness of breath   . Pulmonary embolism     bilateral  . Orthopnea   . Measles     as child  . History of chicken pox     as child  . Cancer     left breast  . Sleep apnea     can not wear CPAP  . Headache(784.0)   . Complication of anesthesia     "woke up after surgery and could not breath"   Past Surgical History  Procedure Laterality Date  . Thyroidectomy, partial  2002  . Breast surgery  2007    cancer  . Knee surgery  2009    left  . Total knee arthroplasty Right 01/30/2013    Procedure: RIGHT TOTAL KNEE ARTHROPLASTY;  Surgeon: Magnus Sinning, MD;  Location:  [facility];  Service: Orthopedics;  Laterality: Right;   Family History  Problem Relation Age of Onset  . Heart disease Father 97  . Cancer Maternal Aunt     breast   History  Substance Use Topics  . Smoking status: Never Smoker   . Smokeless tobacco: Never Used  . Alcohol Use: No   OB History   Grav Para Term Preterm Abortions TAB SAB Ect Mult Living                 Review of Systems  Constitutional: Negative for fever and fatigue.  Musculoskeletal: Negative for back pain and stiffness.  All other systems reviewed and are negative.    Allergies  Meloxicam  Home Medications   Current Outpatient Rx  Name  Route  Sig  Dispense  Refill  . acetaminophen (TYLENOL) 500 MG tablet      Two tabs in the AM and as needed in the evening         . aspirin 81 MG tablet    Oral   Take 81 mg by mouth daily.         Marland Kitchen HYDROcodone-acetaminophen (NORCO/VICODIN) 5-325 MG per tablet   Oral   Take 1 tablet by mouth every 6 (six) hours as needed for moderate pain.         Marland Kitchen levothyroxine (SYNTHROID, LEVOTHROID) 125 MCG tablet   Oral   Take 125 mcg by mouth daily before breakfast.         . lisinopril (PRINIVIL,ZESTRIL) 40 MG tablet   Oral   Take 1 tablet (40 mg total) by mouth every morning.   30 tablet   5   . metFORMIN (GLUCOPHAGE) 1000 MG tablet   Oral   Take 1,000 mg by mouth every morning.          BP 145/52  Pulse 105  Temp(Src) 98.6 F (37 C) (Oral)  Resp 16  SpO2 97% Physical Exam  Nursing note and vitals reviewed. Constitutional: She is oriented to person, place, and time. She appears well-developed and well-nourished.  Uncomfortable appearing  HENT:  Head: Normocephalic and atraumatic.  Right Ear: External ear normal.  Left Ear: External ear normal.  Nose: Nose normal.  Mouth/Throat: Oropharynx is clear and moist. No oropharyngeal exudate.  Eyes: Conjunctivae are normal. Pupils are equal, round, and reactive to light. No scleral icterus.  Neck: Normal range of motion.  Cardiovascular: Regular rhythm and normal heart sounds.  Exam reveals no gallop and no friction rub.   No murmur heard. tachycardia  Pulmonary/Chest: Effort normal and breath sounds normal. No respiratory distress. She has no wheezes. She has no rales. She exhibits no tenderness.  Abdominal: Soft. Bowel sounds are normal. She exhibits no distension. There is no tenderness. There is no rebound and no guarding.  obese  Musculoskeletal:       Right knee: She exhibits decreased range of motion, swelling and erythema. She exhibits no effusion, no deformity, normal alignment and normal patellar mobility. Tenderness found. Medial joint line and lateral joint line tenderness noted.       Right upper leg: She exhibits tenderness and swelling. She exhibits no bony  tenderness and no deformity.       Right lower leg: She exhibits tenderness, swelling and edema. She exhibits no bony tenderness and no deformity.       Legs: Neurological: She is alert and oriented to person, place, and time. She exhibits normal muscle tone. Coordination normal.  Skin: Skin  is warm and dry. Rash noted. There is erythema. No pallor.  Psychiatric: She has a normal mood and affect. Her behavior is normal. Judgment and thought content normal.    ED Course  Procedures (including critical care time) Labs Review Labs Reviewed  CBC WITH DIFFERENTIAL  SEDIMENTATION RATE  C-REACTIVE PROTEIN   Imaging Review No results found.  EKG Interpretation   None      Results for orders placed during the hospital encounter of 09/13/13  CBC WITH DIFFERENTIAL      Result Value Range   WBC 14.6 (*) 4.0 - 10.5 K/uL   RBC 3.35 (*) 3.87 - 5.11 MIL/uL   Hemoglobin 10.2 (*) 12.0 - 15.0 g/dL   HCT 30.1 (*) 36.0 - 46.0 %   MCV 89.9  78.0 - 100.0 fL   MCH 30.4  26.0 - 34.0 pg   MCHC 33.9  30.0 - 36.0 g/dL   RDW 15.1  11.5 - 15.5 %   Platelets 207  150 - 400 K/uL   Neutrophils Relative % 85 (*) 43 - 77 %   Neutro Abs 12.4 (*) 1.7 - 7.7 K/uL   Lymphocytes Relative 6 (*) 12 - 46 %   Lymphs Abs 0.8  0.7 - 4.0 K/uL   Monocytes Relative 10  3 - 12 %   Monocytes Absolute 1.4 (*) 0.1 - 1.0 K/uL   Eosinophils Relative 0  0 - 5 %   Eosinophils Absolute 0.0  0.0 - 0.7 K/uL   Basophils Relative 0  0 - 1 %   Basophils Absolute 0.0  0.0 - 0.1 K/uL  SEDIMENTATION RATE      Result Value Range   Sed Rate 107 (*) 0 - 22 mm/hr  POCT I-STAT, CHEM 8      Result Value Range   Sodium 132 (*) 137 - 147 mEq/L   Potassium 3.8  3.7 - 5.3 mEq/L   Chloride 97  96 - 112 mEq/L   BUN 31 (*) 6 - 23 mg/dL   Creatinine, Ser 1.20 (*) 0.50 - 1.10 mg/dL   Glucose, Bld 123 (*) 70 - 99 mg/dL   Calcium, Ion 1.04 (*) 1.13 - 1.30 mmol/L   TCO2 20  0 - 100 mmol/L   Hemoglobin 11.2 (*) 12.0 - 15.0 g/dL   HCT 33.0  (*) 36.0 - 46.0 %   Dg Knee 1-2 Views Left  09/13/2013   CLINICAL DATA:  Bilateral knee pain without trauma.  EXAM: LEFT KNEE - 1-2 VIEW  COMPARISON:  DG KNEE 2 VIEWS*L* dated 06/17/2008  FINDINGS: Two views of the left knee demonstrate prior arthroplasty. No acute hardware complication. Limited evaluation for joint effusion, secondary to patient body habitus.  Two views of the right knee demonstrate prior arthroplasty. No acute hardware complication. Question anterior and prepatellar soft tissue swelling versus secondary to patient body habitus. Extremely limited evaluation for joint effusion, secondary to patient body habitus and possible overlying soft tissue swelling.  IMPRESSION: Expected appearance after bilateral knee arthroplasties. No acute hardware complication.  Limited evaluation for joint effusion, especially on the right.   Electronically Signed   By: Abigail Miyamoto M.D.   On: 09/13/2013 12:04      MDM  Right knee septic joint CRI  Patient is 73 year old female with history of right knee replacement, likely septic joint.  I have spoken with Dr. Theda Sers PA-C who will come see the patient while here but request that the patient be NPO, and have her  admitted to Medicine.  I have spoken with Dr. Doyle Askew who will admit the patient.   Idalia Needle Joelyn Oms, PA-C 09/13/13 1607

## 2013-09-13 NOTE — ED Notes (Signed)
Per EMS- Patient c/o right knee pain x 4 days. Patient has a history of right knee replacement.

## 2013-09-13 NOTE — ED Notes (Signed)
Admitting MD at bedside.

## 2013-09-13 NOTE — Progress Notes (Signed)
   CARE MANAGEMENT ED NOTE 09/13/2013  Patient:  Amy Burns, Amy Burns   Account Number:  [id number]  Date Initiated:  09/13/2013  Documentation initiated by:  Livia Snellen  Subjective/Objective Assessment:   Patient presents to  ED with woresning pain to right knee     Subjective/Objective Assessment Detail:     Action/Plan:   Action/Plan Detail:   Anticipated DC Date:       Status Recommendation to Physician:   Result of Recommendation:    Other ED Services  Consult Working [place]  CM consult  Other    Choice offered to / List presented to:            Status of service:  Completed, signed off  ED Comments:   ED Comments Detail:  EDCM spoke to patient at bedisde.  Patient lives with her husband Sonia Side at home.  Patient reports she has a walker, cane nad elevated toilet seat at home. Patient has had [place] home health services twice in the past and was very pleased with them.  Patient reports that since last Saturday, she has been unablre to perform her ADL's. Patient's husband reports patient cannot get in or out of the car.  EDCM provided patient with a list of home health agencies in [facility] and a private duty nursing list.  [facility] explained with home health she may recieve a visiting RN, PT, OT aide and social worker if needed for placement.  Lists given to patient's husband. Patient and husband thankfull for resources.  no further EDCM needs at this time.

## 2013-09-13 NOTE — Progress Notes (Signed)
VASCULAR LAB PRELIMINARY  PRELIMINARY  PRELIMINARY  PRELIMINARY  Right lower extremity venous duplex completed.    Preliminary report:  Right:  No evidence of DVT, superficial thrombosis, or Baker's cyst.  Elic Vencill, RVS 09/13/2013, 12:16 PM

## 2013-09-13 NOTE — H&P (Signed)
[facility] History and Physical  ZONNIE LANDEN KNL:[id number] DOB: March 16, 1941 DOA: 09/13/2013  Referring physician: ED physician PCP: Eulas Post, MD   Chief Complaint: right knee pain   HPI:  73 year old female with history of bilateral knee replacement by Dr. Gladstone Lighter several years ago now presents to [facility] ED with main concern of one week duration of progressively worsening right knee pain, throbbing and constant, radiating to the right ankle, 10/10 in severity, associated with erythema and swelling, worse with ambulation and somewhat improved with rest, otherwise with no specific alleviating factors, no similar events in the past. Pt denies fevers, chills, no numbness or tingling, no specific trauma to the area. In Ed, pt in mild distress due to pain, orthopedic team called for consultation and [facility] asked to admit for further evaluation and management, medical bed requested.   Assessment and Plan:  Active Problems: Right knee pain - certainly worrisome for septic joint - place on broad spectrum ABX Vancomycin and Maxipime - follow up on ortho input - keep NPO as recommended for now  - analgesia as needed  Leukocytosis - most likely secondary to an acute illness - placed on ABX as noted above - CBC in AM Acute renal failure - likely pre renal in etiology and secondary to acute illness - place on IVF and repeat BMP in AM Anemia of chronic disease - Hg stable and with no signs of acute bleeding - CBC in AM Diabetes mellitus - hold Metformin due to ARF - place on SSI for now - check A1C Hypothyroidism - continue synthroid  HTN - hold Lisinopril due to ARF - monitor vitals and add antihypertensive regimen as needed   Code Status: Full Family Communication: Pt at bedside Disposition Plan: Admit to medical floor  Review of Systems:  Constitutional: Negative for fever, chills. Negative for diaphoresis.  HENT: Negative for hearing loss, ear pain, nosebleeds,  congestion, sore throat, neck pain, tinnitus and ear discharge.   Eyes: Negative for blurred vision, double vision, photophobia, pain, discharge and redness.  Respiratory: Negative for cough, hemoptysis, sputum production, shortness of breath, wheezing and stridor.   Cardiovascular: Negative for chest pain, palpitations, orthopnea, claudication and leg swelling.  Gastrointestinal: Negative for nausea, vomiting and abdominal pain. Negative for heartburn, constipation, blood in stool and melena.  Genitourinary: Negative for dysuria, urgency, frequency, hematuria and flank pain.  Musculoskeletal: Per HPI.  Skin: Negative for itching and rash.  Neurological: Negative for tingling, tremors, sensory change, speech change, focal weakness, loss of consciousness and headaches.  Endo/Heme/Allergies: Negative for environmental allergies and polydipsia. Does not bruise/bleed easily.  Psychiatric/Behavioral: Negative for suicidal ideas. The patient is not nervous/anxious.      Past Medical History  Diagnosis Date  . HYPOTHYROIDISM 12/11/2008  . 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 HYPERTENSION 12/11/2008  . OSTEOARTHRITIS 12/11/2008  . ALOPECIA 12/11/2008  . Shortness of breath   . Pulmonary embolism     bilateral  . Orthopnea   . Measles     as child  . History of chicken pox     as child  . Cancer     left breast  . Sleep apnea     can not wear CPAP  . Headache(784.0)   . Complication of anesthesia     "woke up after surgery and could not breath"    Past Surgical History  Procedure Laterality Date  . Thyroidectomy, partial  2002  . Breast surgery  2007    cancer  . Knee  surgery  2009    left  . Total knee arthroplasty Right 01/30/2013    Procedure: RIGHT TOTAL KNEE ARTHROPLASTY;  Surgeon: Magnus Sinning, MD;  Location: [facility];  Service: Orthopedics;  Laterality: Right;    Social History:  reports that she has never smoked. She has never used smokeless tobacco. She reports that she does not drink alcohol  or use illicit drugs.  Allergies  Allergen Reactions  . Meloxicam     REACTION: itiching    Family History  Problem Relation Age of Onset  . Heart disease Father 43  . Cancer Maternal Aunt     breast    Prior to Admission medications   Medication Sig Start Date End Date Taking? Authorizing Provider  acetaminophen (TYLENOL) 500 MG tablet Two tabs in the AM and as needed in the evening   Yes Historical Provider, MD  aspirin 81 MG tablet Take 81 mg by mouth daily.   Yes Historical Provider, MD  HYDROcodone-acetaminophen (NORCO/VICODIN) 5-325 MG per tablet Take 1 tablet by mouth every 6 (six) hours as needed for moderate pain.   Yes Historical Provider, MD  levothyroxine (SYNTHROID, LEVOTHROID) 125 MCG tablet Take 125 mcg by mouth daily before breakfast.   Yes Historical Provider, MD  lisinopril (PRINIVIL,ZESTRIL) 40 MG tablet Take 1 tablet (40 mg total) by mouth every morning. 05/14/13  Yes Eulas Post, MD  metFORMIN (GLUCOPHAGE) 1000 MG tablet Take 1,000 mg by mouth every morning.   Yes Historical Provider, MD    Physical Exam: Filed Vitals:   09/13/13 1029  BP: 145/52  Pulse: 105  Temp: 98.6 F (37 C)  TempSrc: Oral  Resp: 16  SpO2: 97%    Physical Exam  Constitutional: Appears well-developed and well-nourished. In mild distress due to pain   HENT: Normocephalic. External right and left ear normal. Oropharynx is clear and moist.  Eyes: Conjunctivae and EOM are normal. PERRLA, no scleral icterus.  Neck: Normal ROM. Neck supple. No JVD. No tracheal deviation. No thyromegaly.  CVS: Regular rhythm, tachycardic, S1/S2 +, no murmurs, no gallops, no carotid bruit.  Pulmonary: Effort and breath sounds normal, no stridor, rhonchi, wheezes, rales.  Abdominal: Soft. BS +,  no distension, tenderness, rebound or guarding.  Musculoskeletal: Right knee area very tender with erythema and swelling, tenderness to palpation  Lymphadenopathy: No lymphadenopathy noted, cervical,  inguinal. Neuro: Alert. Normal reflexes, muscle tone coordination. No cranial nerve deficit. Skin: Skin is warm and dry. No rash noted. Not diaphoretic. No erythema. No pallor.  Psychiatric: Normal mood and affect. Behavior, judgment, thought content normal.   Labs on Admission:  Basic Metabolic Panel:  Recent Labs Lab 09/13/13 1252  NA 132*  K 3.8  CL 97  GLUCOSE 123*  BUN 31*  CREATININE 1.20*   CBC:  Recent Labs Lab 09/13/13 1230 09/13/13 1252  WBC 14.6*  --   NEUTROABS 12.4*  --   HGB 10.2* 11.2*  HCT 30.1* 33.0*  MCV 89.9  --   PLT 207  --    Radiological Exams on Admission: L Knee XRAY 09/13/2013  Expected appearance after bilateral knee arthroplasties. No acute hardware complication.  Limited evaluation for joint effusion, especially on the right.     EKG: Normal sinus rhythm, no ST/T wave changes  Faye Ramsay, MD  [facility] Pager [phone]  If 7PM-7AM, please contact night-coverage [email or web address] Password [email or web address] 09/13/2013, 4:02 PM

## 2013-09-13 NOTE — ED Notes (Signed)
Bed: WTR8 Expected date:  Expected time:  Means of arrival:  Comments: EMS-Knee pain

## 2013-09-13 NOTE — Consult Note (Signed)
Reason for Consult: Right knee pain Referring Physician: Dr.Myers  Amy Burns is an 73 y.o. female.  HPI: 73yo female s/p Right TKA in May of 2014. Was doing well until 4 days ago. Atraumatic onset of right LE pain. Denies fever, chills, or sweats. Transported By EMS to [facility]. Admitted by Hospitalist for possible right lower extremity infection. Orth consult request to R/O infected TKA vs cellulitis.    Past Medical History  Diagnosis Date  . HYPOTHYROIDISM 12/11/2008  . 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 HYPERTENSION 12/11/2008  . OSTEOARTHRITIS 12/11/2008  . ALOPECIA 12/11/2008  . Shortness of breath   . Pulmonary embolism     bilateral  . Orthopnea   . Measles     as child  . History of chicken pox     as child  . Cancer     left breast  . Sleep apnea     can not wear CPAP  . Headache(784.0)   . Complication of anesthesia     "woke up after surgery and could not breath"    Past Surgical History  Procedure Laterality Date  . Thyroidectomy, partial  2002  . Breast surgery  2007    cancer  . Knee surgery  2009    left  . Total knee arthroplasty Right 01/30/2013    Procedure: RIGHT TOTAL KNEE ARTHROPLASTY;  Surgeon: Magnus Sinning, MD;  Location: [facility];  Service: Orthopedics;  Laterality: Right;    Family History  Problem Relation Age of Onset  . Heart disease Father 11  . Cancer Maternal Aunt     breast    Social History:  reports that she has never smoked. She has never used smokeless tobacco. She reports that she does not drink alcohol or use illicit drugs.  Allergies:  Allergies  Allergen Reactions  . Meloxicam     REACTION: itiching    Medications: I have reviewed the patient's current medications.  Results for orders placed during the hospital encounter of 09/13/13 (from the past 48 hour(s))  CBC WITH DIFFERENTIAL     Status: Abnormal   Collection Time    09/13/13 12:30 PM      Result Value Range   WBC 14.6 (*) 4.0 - 10.5 K/uL   RBC 3.35 (*) 3.87 - 5.11 MIL/uL    Hemoglobin 10.2 (*) 12.0 - 15.0 g/dL   HCT 30.1 (*) 36.0 - 46.0 %   MCV 89.9  78.0 - 100.0 fL   MCH 30.4  26.0 - 34.0 pg   MCHC 33.9  30.0 - 36.0 g/dL   RDW 15.1  11.5 - 15.5 %   Platelets 207  150 - 400 K/uL   Neutrophils Relative % 85 (*) 43 - 77 %   Neutro Abs 12.4 (*) 1.7 - 7.7 K/uL   Lymphocytes Relative 6 (*) 12 - 46 %   Lymphs Abs 0.8  0.7 - 4.0 K/uL   Monocytes Relative 10  3 - 12 %   Monocytes Absolute 1.4 (*) 0.1 - 1.0 K/uL   Eosinophils Relative 0  0 - 5 %   Eosinophils Absolute 0.0  0.0 - 0.7 K/uL   Basophils Relative 0  0 - 1 %   Basophils Absolute 0.0  0.0 - 0.1 K/uL  SEDIMENTATION RATE     Status: Abnormal   Collection Time    09/13/13 12:30 PM      Result Value Range   Sed Rate 107 (*) 0 - 22 mm/hr  C-REACTIVE PROTEIN  Status: Abnormal   Collection Time    09/13/13 12:30 PM      Result Value Range   CRP 28.4 (*) <0.60 mg/dL   Comment: Performed at [facility], CHEM 8     Status: Abnormal   Collection Time    09/13/13 12:52 PM      Result Value Range   Sodium 132 (*) 137 - 147 mEq/L   Potassium 3.8  3.7 - 5.3 mEq/L   Chloride 97  96 - 112 mEq/L   BUN 31 (*) 6 - 23 mg/dL   Creatinine, Ser 1.20 (*) 0.50 - 1.10 mg/dL   Glucose, Bld 123 (*) 70 - 99 mg/dL   Calcium, Ion 1.04 (*) 1.13 - 1.30 mmol/L   TCO2 20  0 - 100 mmol/L   Hemoglobin 11.2 (*) 12.0 - 15.0 g/dL   HCT 33.0 (*) 36.0 - 46.0 %    Dg Knee 1-2 Views Left  09/13/2013   CLINICAL DATA:  Bilateral knee pain without trauma.  EXAM: LEFT KNEE - 1-2 VIEW  COMPARISON:  DG KNEE 2 VIEWS*L* dated 06/17/2008  FINDINGS: Two views of the left knee demonstrate prior arthroplasty. No acute hardware complication. Limited evaluation for joint effusion, secondary to patient body habitus.  Two views of the right knee demonstrate prior arthroplasty. No acute hardware complication. Question anterior and prepatellar soft tissue swelling versus secondary to patient body habitus. Extremely limited  evaluation for joint effusion, secondary to patient body habitus and possible overlying soft tissue swelling.  IMPRESSION: Expected appearance after bilateral knee arthroplasties. No acute hardware complication.  Limited evaluation for joint effusion, especially on the right.   Electronically Signed   By: Abigail Miyamoto M.D.   On: 09/13/2013 12:04    ROS Blood pressure 143/49, pulse 115, temperature 98.3 F (36.8 C), temperature source Oral, resp. rate 16, height 5' 2"  (1.575 m), weight 131.09 kg (289 lb), SpO2 98.00%. Physical Exam:  Alert and oriented x4. Comfortable lying in bed. Not ill appearing. Morbid obesity. RLE arthritic patch in place on right ankle. Erythema to right shine radiating to anterior thigh and knee. Multiple red folliculitis appear lesion on thigh region. Leg and knee tender. Knee is not warm to touch. Small effusion if any. Edema appears to be periarticular. Pain with passive and active ROM. No fluid collection in prepatellar bursa.   Procedure: Informed consent obtain Prepped with Betadine Two sperate sites were chosen without going through erythematous areas Minimal joint fluid obtained and not purulent. Sent For Culture and  gram stain.   Assessment/Plan: With increased pain leukocytosis Increase ESR Obviously has a active infection , related to cellulitic process. Does not appear to TKA infection, due to lack of effusion. Procedure above Continue IV ABX Observe response We will continue to Follow PT to consult  weight bear  And OOB as tolerated     Amy Burns Amy Burns 09/13/2013, 8:12 PM

## 2013-09-14 DIAGNOSIS — M1990 Unspecified osteoarthritis, unspecified site: Secondary | ICD-10-CM

## 2013-09-14 DIAGNOSIS — M009 Pyogenic arthritis, unspecified: Secondary | ICD-10-CM

## 2013-09-14 LAB — BASIC METABOLIC PANEL
BUN: 41 mg/dL — ABNORMAL HIGH (ref 6–23)
CALCIUM: 8 mg/dL — AB (ref 8.4–10.5)
CO2: 23 mEq/L (ref 19–32)
CREATININE: 1.87 mg/dL — AB (ref 0.50–1.10)
Chloride: 94 mEq/L — ABNORMAL LOW (ref 96–112)
GFR calc non Af Amer: 26 mL/min — ABNORMAL LOW (ref >90)
GFR, EST AFRICAN AMERICAN: 30 mL/min — AB (ref >90)
Glucose, Bld: 142 mg/dL — ABNORMAL HIGH (ref 70–99)
Potassium: 4.8 mEq/L (ref 3.7–5.3)
Sodium: 132 mEq/L — ABNORMAL LOW (ref 137–147)

## 2013-09-14 LAB — CBC
HCT: 30.7 % — ABNORMAL LOW (ref 36.0–46.0)
Hemoglobin: 10 g/dL — ABNORMAL LOW (ref 12.0–15.0)
MCH: 29.8 pg (ref 26.0–34.0)
MCHC: 32.6 g/dL (ref 30.0–36.0)
MCV: 91.4 fL (ref 78.0–100.0)
PLATELETS: 252 10*3/uL (ref 150–400)
RBC: 3.36 MIL/uL — ABNORMAL LOW (ref 3.87–5.11)
RDW: 15.8 % — ABNORMAL HIGH (ref 11.5–15.5)
WBC: 13.9 10*3/uL — AB (ref 4.0–10.5)

## 2013-09-14 LAB — GLUCOSE, CAPILLARY
GLUCOSE-CAPILLARY: 112 mg/dL — AB (ref 70–99)
Glucose-Capillary: 148 mg/dL — ABNORMAL HIGH (ref 70–99)
Glucose-Capillary: 107 mg/dL — ABNORMAL HIGH (ref 70–99)
Glucose-Capillary: 156 mg/dL — ABNORMAL HIGH (ref 70–99)

## 2013-09-14 LAB — SYNOVIAL CELL COUNT + DIFF, W/ CRYSTALS
Crystals, Fluid: NONE SEEN
Lymphocytes-Synovial Fld: 6 % (ref 0–20)
Monocyte-Macrophage-Synovial Fluid: 12 % — ABNORMAL LOW (ref 50–90)
NEUTROPHIL, SYNOVIAL: 82 % — AB (ref 0–25)
WBC, SYNOVIAL: 138118 /mm3 — AB (ref 0–200)

## 2013-09-14 LAB — HEMOGLOBIN A1C
Hgb A1c MFr Bld: 6.5 % — ABNORMAL HIGH (ref <5.7)
Mean Plasma Glucose: 140 mg/dL — ABNORMAL HIGH (ref <117)

## 2013-09-14 MED ORDER — SODIUM CHLORIDE 0.9 % IV SOLN
INTRAVENOUS | Status: DC
  Administered 2013-09-14: 1000 mL via INTRAVENOUS
  Administered 2013-09-14: 10:00:00 via INTRAVENOUS

## 2013-09-14 MED ORDER — DEXTROSE 5 % IV SOLN
2.0000 g | INTRAVENOUS | Status: DC
  Administered 2013-09-15: 2 g via INTRAVENOUS
  Filled 2013-09-14: qty 2

## 2013-09-14 NOTE — ED Provider Notes (Signed)
Medical screening examination/treatment/procedure(s) were conducted as a shared visit with non-physician practitioner(s) and myself.  I personally evaluated the patient during the encounter.  EKG Interpretation    Date/Time:    Ventricular Rate:    PR Interval:    QRS Duration:   QT Interval:    QTC Calculation:   R Axis:     Text Interpretation:             73 year old female with atraumatic right knee pain. On exam patient's right knee is red and swollen. Difficult to ascertain if this is a cellulitis or possibly septic joint. Her exam is limited by her morbid obesity. She does have pain with both passive and active range of motion of her knee which is concerning. She cannot walk secondary to her degree of pain. Admission to medicine with orthopedic consult.  Virgel Manifold, MD 09/14/13 [phone]

## 2013-09-14 NOTE — Evaluation (Addendum)
Physical Therapy Evaluation Patient Details Name: Amy Burns MRN: [id number] DOB: 1941-07-05 Today's Date: 09/14/2013 Time: 8299-3716 PT Time Calculation (min): 28 min  PT Assessment / Plan / Recommendation History of Present Illness  73 y.o. female admitted with R knee pain. Dx of cellulitis.   Clinical Impression  **Pt admitted with RLE pain, likely cellulitis**. Pt currently with functional limitations due to the deficits listed below (see PT Problem List).  Pt will benefit from skilled PT to increase their independence and safety with mobility to allow discharge to the venue listed below.   *    PT Assessment  Patient needs continued PT services    Follow Up Recommendations  SNF    Does the patient have the potential to tolerate intense rehabilitation      Barriers to Discharge        Equipment Recommendations  None recommended by PT    Recommendations for Other Services     Frequency Min 3X/week    Precautions / Restrictions Precautions Precautions: Fall Restrictions Weight Bearing Restrictions: No   Pertinent Vitals/Pain *2/4 dyspnea with activity ATtempted pulse ox reading, but device did not work 2/10 RLE**      Mobility  Transfers Overall transfer level: Needs assistance Equipment used: Rolling walker (2 wheeled) Transfers: Sit to/from Stand Sit to Stand: Min assist General transfer comment: assist to rise, VCs hand placement Ambulation/Gait Ambulation/Gait assistance: Min assist Ambulation Distance (Feet): 8 Feet Assistive device: Rolling walker (2 wheeled) Gait Pattern/deviations: Step-to pattern;Decreased step length - right;Decreased step length - left;Antalgic;Decreased weight shift to right Gait velocity: decreased due to RLE pain Gait velocity interpretation: <1.8 ft/sec, indicative of risk for recurrent falls General Gait Details: increased time, RLE pain limits distance    Exercises     PT Diagnosis: Difficulty walking;Acute  pain;Generalized weakness  PT Problem List: Decreased strength;Decreased activity tolerance;Pain;Decreased mobility;Obesity PT Treatment Interventions: DME instruction;Gait training;Functional mobility training;Therapeutic exercise;Therapeutic activities;Patient/family education     PT Goals(Current goals can be found in the care plan section) Acute Rehab PT Goals Patient Stated Goal: to walk PT Goal Formulation: With patient Time For Goal Achievement: 09/28/13 Potential to Achieve Goals: Good  Visit Information  Last PT Received On: 09/14/13 History of Present Illness: 73 y.o. female admitted with R knee pain. Dx of cellulitis.        Prior Mariano Colon expects to be discharged to:: Private residence Living Arrangements: Spouse/significant other Available Help at Discharge: Family;Available 24 hours/day Type of Home: House Home Access: Stairs to enter [employer] of Steps: 1 then 1 Entrance Stairs-Rails: None Home Layout: One level Home Equipment: Walker - 2 wheels;Cane - single point;Bedside commode Prior Function Level of Independence: Independent Communication Communication: No difficulties    Cognition  Cognition Arousal/Alertness: Awake/alert Behavior During Therapy: WFL for tasks assessed/performed Overall Cognitive Status: Within Functional Limits for tasks assessed    Extremity/Trunk Assessment Upper Extremity Assessment Upper Extremity Assessment: Overall WFL for tasks assessed Lower Extremity Assessment Lower Extremity Assessment: RLE deficits/detail RLE Deficits / Details: knee extension 2/5, limited by pain RLE: Unable to fully assess due to pain Cervical / Trunk Assessment Cervical / Trunk Assessment: Normal   Balance    End of Session PT - End of Session Equipment Utilized During Treatment: Gait belt Activity Tolerance: Patient limited by fatigue;Patient limited by pain Patient left: in chair;with call bell/phone  within reach;with nursing/sitter in room Nurse Communication: Mobility status  GP     Philomena Doheny 09/14/2013, 10:39  AM [phone]

## 2013-09-14 NOTE — Progress Notes (Signed)
Clinical Social Work Department BRIEF PSYCHOSOCIAL ASSESSMENT 09/14/2013  Patient:  Amy Burns, Amy Burns     Account Number:  [id number]     Admit date:  09/13/2013  Clinical Social Worker:  Earlie Server  Date/Time:  09/14/2013 02:00 PM  Referred by:  Physician  Date Referred:  09/14/2013 Referred for  SNF Placement   Other Referral:   Interview type:  Patient Other interview type:    PSYCHOSOCIAL DATA Living Status:  FAMILY Admitted from facility:   Level of care:   Primary support name:  Sonia Side Primary support relationship to patient:  SPOUSE Degree of support available:   Adequate    CURRENT CONCERNS Current Concerns  Post-Acute Placement   Other Concerns:    SOCIAL WORK ASSESSMENT / PLAN CSW received referral to assist with DC planning. Per chart review, PT recommends SNF placement. CSW met with patient and husband at bedside. CSW introduced myself and explained role.    Patient was living at home with husband prior to admission. Patient reports that husband has been assisting with ambulating and transfers. Patient is hesitant about returning to SNF but husband feels he cannot properly care for patient at home. Patient went to [facility] in the past and prefers to return home. Patient reports if she can return home she has used [facility] in the past. CSW provided SNF list and explained process.    CSW completed FL2 and faxed out. CSW will follow up with bed offers.   Assessment/plan status:  Psychosocial Support/Ongoing Assessment of Needs Other assessment/ plan:   Information/referral to community resources:   SNF list    PATIENT'S/FAMILY'S RESPONSE TO PLAN OF CARE: Patient alert and engaged in assessment. Patient and husband joked during assessment and spoke about being married for 50 years. Patient is upset with the thought of returning to SNF and feels she is losing her independence. Patient aware that husband needs additional assistance and agreeable to SNF if he feels  he cannot care for her. Patient and husband to discuss plans in further detail.       Sindy Messing, LCSW (Coverage for [employer])

## 2013-09-14 NOTE — Progress Notes (Addendum)
Clinical Social Work Department CLINICAL SOCIAL WORK PLACEMENT NOTE 09/14/2013  Patient:  ELLIS, KOFFLER  Account Number:  [id number] Admit date:  09/13/2013  Clinical Social Worker:  Sindy Messing, LCSW  Date/time:  09/14/2013 02:00 PM  Clinical Social Work is seeking post-discharge placement for this patient at the following level of care:   [facility]   (*CSW will update this form in Epic as items are completed)   09/14/2013  Patient/family provided with [facility] Department of Clinical Social Work's list of facilities offering this level of care within the geographic area requested by the patient (or if unable, by the patient's family).  09/14/2013  Patient/family informed of their freedom to choose among providers that offer the needed level of care, that participate in Medicare, Medicaid or managed care program needed by the patient, have an available bed and are willing to accept the patient.  09/14/2013  Patient/family informed of [facility]' ownership interest in [facility], as well as of the fact that they are under no obligation to receive care at this facility.  PASARR submitted to EDS on existing # PASARR number received from EDS on   FL2 transmitted to all facilities in geographic area requested by pt/family on  09/14/2013 FL2 transmitted to all facilities within larger geographic area on   Patient informed that his/her managed care company has contracts with or will negotiate with  certain facilities, including the following:     Patient/family informed of bed offers received:  09/15/2013 Patient chooses bed at [facility] Physician recommends and patient chooses bed at    Patient to be transferred to  on   Patient to be transferred to facility by   The following physician request were entered in Epic:   Additional Comments:

## 2013-09-14 NOTE — Progress Notes (Addendum)
ANTIBIOTIC CONSULT NOTE - Follow up  Pharmacy Consult for Vancomycin, cefepime Indication: PJI  Allergies  Allergen Reactions  . Meloxicam     REACTION: itiching    Patient Measurements: Weight = 131.1kg Height 5'2"  Labs:  Recent Labs  09/13/13 1230 09/13/13 1252 09/14/13 0410  WBC 14.6*  --  13.9*  HGB 10.2* 11.2* 10.0*  PLT 207  --  252  CREATININE  --  1.20* 1.87*   Estimated Creatinine Clearance: 34.9 ml/min (by C-G formula based on Cr of 1.87).   Assessment: Amy Burns with PMHx significant for BL knee replacements admitted 1/8 with progressively worsening R knee and ankle pain.   RLE dopplers negative for DVT.  Pharmacy consulted to dose vancomycin and cefepime for probable septic arthritis/PJI.   D2 Antibiotics 1/8 >> Vancomycin >> 1/8 >> Cefepime >>  Tm24h: remains afebrile WBC: improved slightly to 13.9 Renal: SCr worsened to 1.87, CrCl ~35 ml/min (N30), UOP not accurately charted  Weight = 131kg  Microbiology: 1/8 Joint aspirate Cx: collected  Goal of Therapy:  Vancomycin trough level 15-20 mcg/ml Eradication of infection  Plan:  - continue Vancomycin 1750mg  IV q 24 hours based on obesity nomogram - change cefepime to 2 grams IV q 24 hours due to declining renal function to start at 1200 on 1/10 - F/u renal function, vancomycin trough at C[facility], clinical course  Johny Drilling, PharmD, BCPS Pager: [phone] Pharmacy: [phone] 09/14/2013 5:47 PM

## 2013-09-14 NOTE — Progress Notes (Addendum)
Patient ID: Amy Burns, female   DOB: 1941/02/19, 73 y.o.   MRN: [id number]  [facility] PROGRESS NOTE  CORALIE STANKE WJX:[id number] DOB: [birth year] DOA: 09/13/2013 PCP: Eulas Post, MD  Brief narrative: 73 year old female with history of bilateral knee replacement by Dr. Gladstone Lighter several years ago now presents to [facility] ED with main concern of one week duration of progressively worsening right knee pain, throbbing and constant, radiating to the right ankle, 10/10 in severity, associated with erythema and swelling, worse with ambulation and somewhat improved with rest, otherwise with no specific alleviating factors, no similar events in the past. Pt denies fevers, chills, no numbness or tingling, no specific trauma to the area. In Ed, pt in mild distress due to pain, orthopedic team called for consultation and [facility] asked to admit for further evaluation and management, medical bed requested.   Assessment and Plan:  Active Problems:  Right knee pain  - appreciate ortho assistance, status post right knee joint aspiration  - fluid sent for analysis and will need to follow up on results - continue ABX Vancomycin and Maxipime day #2 - analgesia as needed  Leukocytosis  - most likely secondary to an acute illness  - placed on ABX as noted above and will continue same regimen   - CBC in AM  Acute renal failure  - likely pre renal in etiology and secondary to acute illness  - Cr up over the past 24 hours  - start IVF NS and repeat BMP in AM Anemia of chronic disease  - Hg stable and with no signs of acute bleeding  - CBC in AM  Diabetes mellitus  - continue to hold Metformin due to ARF  - pt on SSI for now  - A1C 6.5 (09/13/2012) Hypothyroidism  - continue synthroid  HTN  - continue to hold Lisinopril due to ARF  - BP stable over the past 24 hours  - monitor vitals and add antihypertensive regimen  As indicated   Consultants:  Ortho   Procedures/Studies: Dg Knee 1-2 Views  Left   09/13/2013  Expected appearance after bilateral knee arthroplasties. No acute hardware complication.  Limited evaluation for joint effusion, especially on the right.     Antibiotics:  Vancomycin 01/08 -->  Maxipime 01/08 -->  Code Status: Full Family Communication: Pt at bedside Disposition Plan: SNF recommended, SW consultation requested   HPI/Subjective: No events overnight.   Objective: Filed Vitals:   09/13/13 1801 09/13/13 1806 09/13/13 2011 09/14/13 0545  BP:  143/49 123/42 115/40  Pulse:  115 111 108  Temp:  98.3 F (36.8 C) 98.8 F (37.1 C) 98.4 F (36.9 C)  TempSrc:  Oral Oral Oral  Resp:    16  Height: 5\' 2"  (1.575 m)     Weight: 131.09 kg (289 lb)     SpO2:  98% 93% 95%    Intake/Output Summary (Last 24 hours) at 09/14/13 0947 Last data filed at 09/13/13 2208  Gross per 24 hour  Intake      0 ml  Output    325 ml  Net   -325 ml    Exam:   General:  Pt is alert, follows commands appropriately, not in acute distress  Cardiovascular: Regular rhythm, tachycardic, S1/S2, no murmurs, no rubs, no gallops  Respiratory: Clear to auscultation bilaterally, no wheezing, no crackles, no rhonchi  Abdomen: Soft, non tender, non distended, bowel sounds present, no guarding  Extremities: Right knee still tender but less erythema  and less swelling   Neuro: Grossly nonfocal  Data Reviewed: Basic Metabolic Panel:  Recent Labs Lab 09/13/13 1252 09/14/13 0410  NA 132* 132*  K 3.8 4.8  CL 97 94*  CO2  --  23  GLUCOSE 123* 142*  BUN 31* 41*  CREATININE 1.20* 1.87*  CALCIUM  --  8.0*   CBC:  Recent Labs Lab 09/13/13 1230 09/13/13 1252 09/14/13 0410  WBC 14.6*  --  13.9*  NEUTROABS 12.4*  --   --   HGB 10.2* 11.2* 10.0*  HCT 30.1* 33.0* 30.7*  MCV 89.9  --  91.4  PLT 207  --  252   CBG:  Recent Labs Lab 09/13/13 2214 09/14/13 0750  GLUCAP 128* 112*   Scheduled Meds: . aspirin EC  81 mg Oral Daily  . ceFEPime (MAXIPIME) IV  1 g  Intravenous Q8H  . enoxaparin (LOVENOX) injection  70 mg Subcutaneous Q24H  . insulin aspart  0-9 Units Subcutaneous TID WC  . levothyroxine  125 mcg Oral QAC breakfast  . vancomycin  1,750 mg Intravenous Q24H   Continuous Infusions:    Faye Ramsay, MD  [facility] Pager [phone]  If 7PM-7AM, please contact night-coverage [email or web address] Password [email or web address] 09/14/2013, 9:47 AM   LOS: 1 day

## 2013-09-14 NOTE — Progress Notes (Signed)
Subjective: States she is comfortable while resting but still has knee pain with movement. But was able to ambulate in the room today.   Objective: Vital signs in last 24 hours: Temp:  [98.3 F (36.8 C)-98.8 F (37.1 C)] 98.6 F (37 C) (01/09 1430) Pulse Rate:  [108-115] 108 (01/09 1430) Resp:  [16-18] 18 (01/09 1430) BP: (111-143)/(40-49) 111/40 mmHg (01/09 1430) SpO2:  [93 %-98 %] 94 % (01/09 1430) Weight:  [131.09 kg (289 lb)-136.079 kg (300 lb)] 131.09 kg (289 lb) (01/08 1801)  Intake/Output from previous day: 01/08 0701 - 01/09 0700 In: -  Out: 325 [Urine:325] Intake/Output this shift: Total I/O In: 480 [P.O.:480] Out: -    Recent Labs  09/13/13 1230 09/13/13 1252 09/14/13 0410  HGB 10.2* 11.2* 10.0*    Recent Labs  09/13/13 1230 09/13/13 1252 09/14/13 0410  WBC 14.6*  --  13.9*  RBC 3.35*  --  3.36*  HCT 30.1* 33.0* 30.7*  PLT 207  --  252    Recent Labs  09/13/13 1252 09/14/13 0410  NA 132* 132*  K 3.8 4.8  CL 97 94*  CO2  --  23  BUN 31* 41*  CREATININE 1.20* 1.87*  GLUCOSE 123* 142*  CALCIUM  --  8.0*   No results found for this basename: LABPT, INR,  in the last 72 hours  erythema of leg decreased but knee still tender diffusely  Lab could not process last night sample         Procedure: sterile prep anesth with lidocaine  Aspirated 50 cc purulent material and decompressed joint.   Fluid resent for sudies. Assessment/Plan:  reviewed history she had prolonged knee drainage in early post op course  Sig knee pain over a week. Discussed with patient , husband ,and Dr Gladstone Lighter. Will need Iand D and implant removal on Monday antibiotic spacer. 6 weeks IV antibiotics then reimplantation. Will neeed PIC line . Have placed on OR schedule for Monday with Dr Alvan Dame. I will discuss with him Sunday. Will stop lovenox Sunday night.  Continue current treatment till surgery.   Michaelle Bottomley ANDREW 09/14/2013, 4:23 PM

## 2013-09-14 NOTE — Progress Notes (Signed)
Subjective: Patient seen and examined. Reports less pain in RLE. Reports a good night. Continues to have limited mobility. Denies Fever or chills. Tolerating PO's well. No complications with medications.  Objective: Vital signs in last 24 hours: Temp:  [98.3 F (36.8 C)-98.8 F (37.1 C)] 98.4 F (36.9 C) (01/09 0545) Pulse Rate:  [105-115] 108 (01/09 0545) Resp:  [16] 16 (01/09 0545) BP: (115-145)/(40-52) 115/40 mmHg (01/09 0545) SpO2:  [93 %-98 %] 95 % (01/09 0545) Weight:  [131.09 kg (289 lb)-136.079 kg (300 lb)] 131.09 kg (289 lb) (01/08 1801)  Intake/Output from previous day: 01/08 0701 - 01/09 0700 In: -  Out: 325 [Urine:325] Intake/Output this shift:     Recent Labs  09/13/13 1230 09/13/13 1252 09/14/13 0410  HGB 10.2* 11.2* 10.0*    Recent Labs  09/13/13 1230 09/13/13 1252 09/14/13 0410  WBC 14.6*  --  13.9*  RBC 3.35*  --  3.36*  HCT 30.1* 33.0* 30.7*  PLT 207  --  252    Recent Labs  09/13/13 1252 09/14/13 0410  NA 132* 132*  K 3.8 4.8  CL 97 94*  CO2  --  23  BUN 31* 41*  CREATININE 1.20* 1.87*  GLUCOSE 123* 142*  CALCIUM  --  8.0*   No results found for this basename: LABPT, INR,  in the last 72 hours  ALert and oriented x3. Improved Right knee pain with ROM. Calf soft and non-tender. Aspirate sites look appropriate. Erythema remains in the knee region.   Assessment/Plan: Knee and RLE pain: Improved WBC Improved pain Awaiting Micro from Fluid aspirate. Continue to monitor if aspiration is neg then probably cellulitis. Continue ABX  Morbid Obesity: Diet modification Exercise PT for mobility weight bear as tolerated with assistive devices.    STILWELL, BRYSON L 09/14/2013, 7:30 AM

## 2013-09-15 ENCOUNTER — Inpatient Hospital Stay (HOSPITAL_COMMUNITY): Payer: Medicare Other

## 2013-09-15 LAB — GLUCOSE, CAPILLARY
Glucose-Capillary: 124 mg/dL — ABNORMAL HIGH (ref 70–99)
Glucose-Capillary: 144 mg/dL — ABNORMAL HIGH (ref 70–99)
Glucose-Capillary: 122 mg/dL — ABNORMAL HIGH (ref 70–99)
Glucose-Capillary: 138 mg/dL — ABNORMAL HIGH (ref 70–99)

## 2013-09-15 LAB — BASIC METABOLIC PANEL
BUN: 35 mg/dL — ABNORMAL HIGH (ref 6–23)
CHLORIDE: 98 meq/L (ref 96–112)
CO2: 24 meq/L (ref 19–32)
Calcium: 8.4 mg/dL (ref 8.4–10.5)
Creatinine, Ser: 1.17 mg/dL — ABNORMAL HIGH (ref 0.50–1.10)
GFR calc Af Amer: 52 mL/min — ABNORMAL LOW (ref >90)
GFR calc non Af Amer: 45 mL/min — ABNORMAL LOW (ref >90)
GLUCOSE: 150 mg/dL — AB (ref 70–99)
Potassium: 4 mEq/L (ref 3.7–5.3)
SODIUM: 135 meq/L — AB (ref 137–147)

## 2013-09-15 MED ORDER — METHOCARBAMOL 500 MG PO TABS
500.0000 mg | ORAL_TABLET | Freq: Four times a day (QID) | ORAL | Status: DC | PRN
  Administered 2013-09-15 – 2013-09-17 (×4): 500 mg via ORAL
  Filled 2013-09-15 (×4): qty 1

## 2013-09-15 MED ORDER — DEXTROSE 5 % IV SOLN
2.0000 g | Freq: Two times a day (BID) | INTRAVENOUS | Status: DC
  Administered 2013-09-15 – 2013-09-16 (×2): 2 g via INTRAVENOUS
  Filled 2013-09-15 (×3): qty 2

## 2013-09-15 MED ORDER — POLYETHYLENE GLYCOL 3350 17 G PO PACK
17.0000 g | PACK | Freq: Every day | ORAL | Status: DC
  Administered 2013-09-15 – 2013-09-24 (×7): 17 g via ORAL
  Filled 2013-09-15 (×11): qty 1

## 2013-09-15 MED ORDER — FUROSEMIDE 10 MG/ML IJ SOLN
20.0000 mg | Freq: Once | INTRAMUSCULAR | Status: AC
  Administered 2013-09-15: 20 mg via INTRAVENOUS
  Filled 2013-09-15: qty 2

## 2013-09-15 NOTE — Progress Notes (Signed)
ANTIBIOTIC CONSULT NOTE - Follow up  Pharmacy Consult for Vancomycin, cefepime Indication: PJI  Allergies  Allergen Reactions  . Meloxicam     REACTION: itiching    Patient Measurements: Weight = 131.1kg Height 5'2"  Labs:  Recent Labs  09/13/13 1230 09/13/13 1252 09/14/13 0410 09/15/13 1606  WBC 14.6*  --  13.9*  --   HGB 10.2* 11.2* 10.0*  --   PLT 207  --  252  --   CREATININE  --  1.20* 1.87* 1.17*   Estimated Creatinine Clearance: 55.8 ml/min (by C-G formula based on Cr of 1.17).   Assessment: 35 yoF with PMHx significant for BL knee replacements admitted 1/8 with progressively worsening R knee and ankle pain.   RLE dopplers negative for DVT.  Pharmacy consulted to dose vancomycin and cefepime for probable septic arthritis/PJI.   1/8 >> Vanc >> 1/8 >> Cefepime >>  Tm24h: remains afebrile WBC: improved slightly to 13.9 Renal: SCr 1.2 -> 1.87 -> 1.17 (Up and down?), 56CG, 48N.  Microbiology: 1/9 Joint aspirate: Staph aureus - sens pending  Dose changes: 1/8: cefepime started at 1g q8h 1/9: cefepime changed to 2g q24h due to declining renal function, higher dose chosen in case deep seeded infection. 1/10: SCr much better so change cefepime to 2g q12h.   Goal of Therapy:  Vancomycin trough level 15-20 mcg/ml Eradication of infection  Plan:   Change Cefepime from 2g IV q24h to 2g IV q12h.  Continue Vanc 1750mg  IV q24h. Measure Vanc trough at steady state. Follow up renal fxn and culture results.  Romeo Rabon, PharmD, pager [phone]. 09/15/2013,5:50 PM.

## 2013-09-15 NOTE — Progress Notes (Signed)
Gave bed offers.  Pt and husband to discuss d/c plans and SNF choice once Pt has had surgery on Mon.  Weekday CSW to follow.  Bernita Raisin, [place] [employer] [phone]

## 2013-09-15 NOTE — Progress Notes (Addendum)
Patient ID: Amy Burns, female   DOB: Jun 18, 1941, 73 y.o.   MRN: [id number] [facility] PROGRESS NOTE  Amy Burns [id number] DOB: [birth year] DOA: Sep 15, 2013 PCP: Eulas Post, MD  Brief narrative:  73 year old female with history of bilateral knee replacement by Dr. Gladstone Lighter several years ago now presents to [facility] ED with main concern of one week duration of progressively worsening right knee pain, throbbing and constant, radiating to the right ankle, 10/10 in severity, associated with erythema and swelling, worse with ambulation and somewhat improved with rest, otherwise with no specific alleviating factors, no similar events in the past. Pt denies fevers, chills, no numbness or tingling, no specific trauma to the area. In Ed, pt in mild distress due to pain, orthopedic team called for consultation and [facility] asked to admit for further evaluation and management, medical bed requested.   Assessment and Plan:  Active Problems:  Right knee pain  - appreciate ortho assistance, status post right knee joint aspiration  - fluid sent for analysis and will need to follow up on results, final reports still pending   - continue ABX Vancomycin and Maxipime day #3 - analgesia as needed  - plan to take to OR Monday 01/12 - will need PICC line and order placed  Leukocytosis  - most likely secondary to an acute illness  - placed on ABX as noted above and will continue same regimen  - CBC in AM  Mild vascular congestion - hold IVF, obtain CXR for further evaluation - encourage PO intake  Acute renal failure  - likely pre renal in etiology and secondary to acute illness  - Cr up over the past 48 hours  - repeat BMP in AM Anemia of chronic disease  - Hg stable and with no signs of acute bleeding  - CBC in AM  Diabetes mellitus  - continue to hold Metformin due to ARF  - pt on SSI for now  - A1C 6.5 (09/15/12)  Hypothyroidism  - continue synthroid  HTN  - continue to hold  Lisinopril due to ARF  - BP stable over the past 24 hours  - monitor vitals and add antihypertensive regimen as indicated   Consultants:  Ortho  Procedures/Studies:  Dg Knee 1-2 Views Left 09-15-13 Expected appearance after bilateral knee arthroplasties. No acute hardware complication. Limited evaluation for joint effusion, especially on the right.  Antibiotics:  Vancomycin 2022/09/15 -->  Maxipime 15-Sep-2022 -->  Code Status: Full  Family Communication: Pt at bedside  Disposition Plan: SNF recommended, SW consultation requested   HPI/Subjective: No events overnight.   Objective: Filed Vitals:   09/14/13 1430 09/14/13 2020 09/14/13 2045 09/15/13 0515  BP: 111/40  151/53 120/50  Pulse: 108 102 109 100  Temp: 98.6 F (37 C)  98.7 F (37.1 C) 98.3 F (36.8 C)  TempSrc: Oral  Oral Oral  Resp: 18  20 16   Height:      Weight:      SpO2: 94%  97% 96%    Intake/Output Summary (Last 24 hours) at 09/15/13 0659 Last data filed at 09/15/13 0524  Gross per 24 hour  Intake   2350 ml  Output   1300 ml  Net   1050 ml    Exam:   General:  Pt is alert, follows commands appropriately, not in acute distress  Cardiovascular: Regular rate and rhythm, S1/S2, no murmurs, no rubs, no gallops  Respiratory: Clear to auscultation bilaterally, no wheezing, mild bibasilar crackles  Abdomen: Soft, non tender, non distended, bowel sounds present, no guarding  Extremities: Right knee edema improved with TTP, pulses DP and PT palpable bilaterally  Neuro: Grossly nonfocal  Data Reviewed: Basic Metabolic Panel:  Recent Labs Lab 09/13/13 1252 09/14/13 0410  NA 132* 132*  K 3.8 4.8  CL 97 94*  CO2  --  23  GLUCOSE 123* 142*  BUN 31* 41*  CREATININE 1.20* 1.87*  CALCIUM  --  8.0*   CBC:  Recent Labs Lab 09/13/13 1230 09/13/13 1252 09/14/13 0410  WBC 14.6*  --  13.9*  NEUTROABS 12.4*  --   --   HGB 10.2* 11.2* 10.0*  HCT 30.1* 33.0* 30.7*  MCV 89.9  --  91.4  PLT 207  --  252    CBG:  Recent Labs Lab 09/13/13 2214 09/14/13 0750 09/14/13 1157 09/14/13 1745 09/14/13 2036  GLUCAP 128* 112* 148* 107* 156*   Scheduled Meds: . aspirin EC  81 mg Oral Daily  . ceFEPime (MAXIPIME) IV  2 g Intravenous Q24H  . enoxaparin (LOVENOX) injection  70 mg Subcutaneous Q24H  . insulin aspart  0-9 Units Subcutaneous TID WC  . levothyroxine  125 mcg Oral QAC breakfast  . vancomycin  1,750 mg Intravenous Q24H   Continuous Infusions: . sodium chloride 1,000 mL (09/14/13 2022)   Faye Ramsay, MD  [facility] Pager [phone]  If 7PM-7AM, please contact night-coverage [email or web address] Password [email or web address] 09/15/2013, 6:59 AM   LOS: 2 days

## 2013-09-15 NOTE — Progress Notes (Signed)
Subjective: C/o right knee pain and leg spasms.  Husband states that she is on the sched with dr Alvan Dame Monday for surgery due to infected TKR.  Dr Theda Sers had already discussed this with patient and husband.    Objective: Vital signs in last 24 hours: Temp:  [98.3 F (36.8 C)-98.7 F (37.1 C)] 98.3 F (36.8 C) (01/10 0515) Pulse Rate:  [100-109] 100 (01/10 0515) Resp:  [16-20] 16 (01/10 0515) BP: (111-151)/(40-53) 120/50 mmHg (01/10 0515) SpO2:  [94 %-97 %] 96 % (01/10 0515)  Intake/Output from previous day: 01/09 0701 - 01/10 0700 In: 2350 [P.O.:850; I.V.:950; IV Piggyback:550] Out: 1300 [Urine:1300] Intake/Output this shift:     Recent Labs  09/13/13 1230 09/13/13 1252 09/14/13 0410  HGB 10.2* 11.2* 10.0*    Recent Labs  09/13/13 1230 09/13/13 1252 09/14/13 0410  WBC 14.6*  --  13.9*  RBC 3.35*  --  3.36*  HCT 30.1* 33.0* 30.7*  PLT 207  --  252    Recent Labs  09/13/13 1252 09/14/13 0410  NA 132* 132*  K 3.8 4.8  CL 97 94*  CO2  --  23  BUN 31* 41*  CREATININE 1.20* 1.87*  GLUCOSE 123* 142*  CALCIUM  --  8.0*    Exam:  Right knee with some redness and is diffusely tender.  Nvi.    Assessment/Plan: Patient sched for right knee I&D, removal of implants and abx spacer Monday afternoon with Dr Alvan Dame.  Continue present care.     Harshbarger,Chequita Mofield M 09/15/2013, 10:13 AM

## 2013-09-15 NOTE — Progress Notes (Signed)
Results of patient's body fluid from R knee text paged to Dr. Lyndle Herrlich new order, patient on IV antibiotic.- Sandie Ano RN

## 2013-09-16 LAB — CBC
HEMATOCRIT: 29.5 % — AB (ref 36.0–46.0)
HEMOGLOBIN: 9.7 g/dL — AB (ref 12.0–15.0)
MCH: 29.7 pg (ref 26.0–34.0)
MCHC: 32.9 g/dL (ref 30.0–36.0)
MCV: 90.2 fL (ref 78.0–100.0)
Platelets: 266 10*3/uL (ref 150–400)
RBC: 3.27 MIL/uL — AB (ref 3.87–5.11)
RDW: 15.7 % — ABNORMAL HIGH (ref 11.5–15.5)
WBC: 10 10*3/uL (ref 4.0–10.5)

## 2013-09-16 LAB — GLUCOSE, CAPILLARY
GLUCOSE-CAPILLARY: 133 mg/dL — AB (ref 70–99)
GLUCOSE-CAPILLARY: 139 mg/dL — AB (ref 70–99)
GLUCOSE-CAPILLARY: 128 mg/dL — AB (ref 70–99)
Glucose-Capillary: 125 mg/dL — ABNORMAL HIGH (ref 70–99)

## 2013-09-16 LAB — BODY FLUID CULTURE

## 2013-09-16 LAB — BASIC METABOLIC PANEL
BUN: 31 mg/dL — AB (ref 6–23)
CALCIUM: 8 mg/dL — AB (ref 8.4–10.5)
CHLORIDE: 100 meq/L (ref 96–112)
CO2: 26 meq/L (ref 19–32)
CREATININE: 0.95 mg/dL (ref 0.50–1.10)
GFR calc Af Amer: 67 mL/min — ABNORMAL LOW (ref >90)
GFR calc non Af Amer: 58 mL/min — ABNORMAL LOW (ref >90)
GLUCOSE: 131 mg/dL — AB (ref 70–99)
Potassium: 4.3 mEq/L (ref 3.7–5.3)
Sodium: 136 mEq/L — ABNORMAL LOW (ref 137–147)

## 2013-09-16 LAB — TYPE AND SCREEN
ABO/RH(D): A POS
ANTIBODY SCREEN: NEGATIVE

## 2013-09-16 LAB — SURGICAL PCR SCREEN
MRSA, PCR: POSITIVE — AB
Staphylococcus aureus: POSITIVE — AB

## 2013-09-16 LAB — VANCOMYCIN, TROUGH: Vancomycin Tr: 11.7 ug/mL (ref 10.0–20.0)

## 2013-09-16 MED ORDER — VANCOMYCIN HCL 10 G IV SOLR
1250.0000 mg | Freq: Two times a day (BID) | INTRAVENOUS | Status: DC
  Administered 2013-09-16 – 2013-09-18 (×4): 1250 mg via INTRAVENOUS
  Filled 2013-09-16 (×7): qty 1250

## 2013-09-16 NOTE — Progress Notes (Signed)
Lab called stating that the results of pt. Body fluid culture are positive for MRSA.  MD notified.

## 2013-09-16 NOTE — Progress Notes (Signed)
ANTIBIOTIC CONSULT NOTE - Follow up  Pharmacy Consult for Vancomycin, cefepime Indication: PJI  Allergies  Allergen Reactions  . Meloxicam     REACTION: itiching    Patient Measurements: Weight = 131.1kg Height 5'2"  Labs:  Recent Labs  09/13/13 1230  09/13/13 1252 09/14/13 0410 09/15/13 1606 09/16/13 0400  WBC 14.6*  --   --  13.9*  --  10.0  HGB 10.2*  --  11.2* 10.0*  --  9.7*  PLT 207  --   --  252  --  266  CREATININE  --   < > 1.20* 1.87* 1.17* 0.95  < > = values in this interval not displayed. Estimated Creatinine Clearance: 70.1 ml/min (by C-G formula based on Cr of 0.95).   Assessment: 27 yoF with PMHx significant for BL knee replacements admitted 1/8 with progressively worsening R knee and ankle pain.   RLE dopplers negative for DVT.  Pharmacy consulted to dose vancomycin and cefepime for probable septic arthritis/PJI.   1/8 >> Vanc >> 1/8 >> Cefepime >> 1/11  Tm24h: remains afebrile WBC: improved to wnl Renal: SCr 1.2 -> 1.87 -> 1.17 -> 0.95, 70CG, 59N.  1/9 Joint aspirate: MRSA   Goal of Therapy:  Vancomycin trough level 15-20 mcg/ml Eradication of infection  Plan:   Stop Cefepime.  Continue Vanc 1750mg  IV q24h. Measure Vanc trough tonight. Follow up renal fxn and culture results.  Romeo Rabon, PharmD, pager [phone]. 09/16/2013,12:20 PM.

## 2013-09-16 NOTE — Progress Notes (Signed)
Patient ID: Amy Burns, female   DOB: Jun 14, 1941, 73 y.o.   MRN: [id number]  [facility] PROGRESS NOTE  ALIYANNA WASSMER WUJ:[id number] DOB: July 18, 1941 DOA: 10/03/2013 PCP: Eulas Post, MD  Brief narrative:  73 year old female with history of bilateral knee replacement by Dr. Gladstone Lighter several years ago now presents to [facility] ED with main concern of one week duration of progressively worsening right knee pain, throbbing and constant, radiating to the right ankle, 10/10 in severity, associated with erythema and swelling, worse with ambulation and somewhat improved with rest, otherwise with no specific alleviating factors, no similar events in the past. Pt denies fevers, chills, no numbness or tingling, no specific trauma to the area. In Ed, pt in mild distress due to pain, orthopedic team called for consultation and [facility] asked to admit for further evaluation and management, medical bed requested.   Assessment and Plan:  Active Problems:  Right knee pain  - appreciate ortho assistance, status post right knee joint aspiration  - fluid sent for analysis and positive for abundant MRSA - continue ABX Vancomycin day #4 but will discontinue Maxipime - analgesia as needed  - plan to take to OR Monday 01/12  - PICC line placement ordered  Leukocytosis  - most likely secondary to an acute illness  - placed on ABX as noted above  - WBC trending down and WNL this AM - CBC in AM Mild vascular congestion  - CXR with developing pulmonary edema - one dose of Lasix given and pt has responded well  - off IVF  Acute renal failure  - likely pre renal in etiology and secondary to acute illness  - Cr trending down and WNL this AM  - repeat BMP in AM  Anemia of chronic disease  - Hg stable and with no signs of acute bleeding  - CBC in AM  Diabetes mellitus  - continue to hold Metformin due to ARF  - pt on SSI for now  - A1C 6.5 (10-03-12)  Hypothyroidism  - continue synthroid  HTN  -  continue to hold Lisinopril  - BP stable over the past 24 hours  - monitor vitals and add antihypertensive regimen as indicated   Consultants:  Ortho  Procedures/Studies:  Dg Knee 1-2 Views Left 2013/10/03 Expected appearance after bilateral knee arthroplasties. No acute hardware complication. Limited evaluation for joint effusion, especially on the right.  Antibiotics:  Vancomycin 2022-10-03 -->  Maxipime October 03, 2022 --> 01/11  Code Status: Full  Family Communication: Pt at bedside  Disposition Plan: SNF recommended, SW consultation requested   HPI/Subjective: No events overnight.   Objective: Filed Vitals:   09/15/13 0515 09/15/13 1435 09/15/13 2023 09/16/13 0521  BP: 120/50 122/42 121/58 144/56  Pulse: 100 102 105 96  Temp: 98.3 F (36.8 C) 98 F (36.7 C) 98 F (36.7 C) 97.7 F (36.5 C)  TempSrc: Oral Oral Oral Oral  Resp: 16 22 20 20   Height:      Weight:    135.308 kg (298 lb 4.8 oz)  SpO2: 96% 93% 100% 100%    Intake/Output Summary (Last 24 hours) at 09/16/13 1158 Last data filed at 09/16/13 0522  Gross per 24 hour  Intake    410 ml  Output   3150 ml  Net  -2740 ml    Exam:   General:  Pt is alert, follows commands appropriately, not in acute distress  Cardiovascular: Regular rate and rhythm, S1/S2, no murmurs, no rubs, no gallops  Respiratory:  Clear to auscultation bilaterally, diminished breath sounds at bases   Abdomen: Soft, non tender, non distended, bowel sounds present, no guarding  Extremities: right knee tenderness to palpation and slightly warm, less swelling  Neuro: Grossly nonfocal  Data Reviewed: Basic Metabolic Panel:  Recent Labs Lab 09/13/13 1252 09/14/13 0410 09/15/13 1606 09/16/13 0400  NA 132* 132* 135* 136*  K 3.8 4.8 4.0 4.3  CL 97 94* 98 100  CO2  --  23 24 26   GLUCOSE 123* 142* 150* 131*  BUN 31* 41* 35* 31*  CREATININE 1.20* 1.87* 1.17* 0.95  CALCIUM  --  8.0* 8.4 8.0*   CBC:  Recent Labs Lab 09/13/13 1230  09/13/13 1252 09/14/13 0410 09/16/13 0400  WBC 14.6*  --  13.9* 10.0  NEUTROABS 12.4*  --   --   --   HGB 10.2* 11.2* 10.0* 9.7*  HCT 30.1* 33.0* 30.7* 29.5*  MCV 89.9  --  91.4 90.2  PLT 207  --  252 266   CBG:  Recent Labs Lab 09/15/13 1138 09/15/13 1750 09/15/13 2135 09/16/13 0826 09/16/13 1144  GLUCAP 144* 122* 138* 133* 125*    Recent Results (from the past 240 hour(s))  ANAEROBIC CULTURE     Status: None   Collection Time    09/13/13  8:00 PM      Result Value Range Status   Specimen Description KNEE RIGHT KNEE JOINT   Final   Special Requests ANAC ONLY   Final   Gram Stain PENDING   Incomplete   Culture     Final   Value: NO ANAEROBES ISOLATED; CULTURE IN PROGRESS FOR 5 DAYS     Performed at [facility]   Report Status PENDING   Incomplete  BODY FLUID CULTURE     Status: None   Collection Time    09/14/13  4:27 PM      Result Value Range Status   Specimen Description KNEE   Final   Special Requests NONE   Final   Gram Stain     Final   Value: ABUNDANT WBC PRESENT,BOTH PMN AND MONONUCLEAR     MODERATE GRAM POSITIVE COCCI     IN PAIRS IN CLUSTERS Gram Stain Report Called to,Read Back By and Verified With: Gram Stain Report Called to,Read Back By and Verified With: DONNA A @1131  ON 09/15/13 BY SMIAS     Performed at [facility]   Culture     Final   Value: ABUNDANT METHICILLIN RESISTANT STAPHYLOCOCCUS AUREUS     Note: RIFAMPIN AND GENTAMICIN SHOULD NOT BE USED AS SINGLE DRUGS FOR TREATMENT OF STAPH INFECTIONS. CRITICAL RESULT CALLED TO, READ BACK BY AND VERIFIED WITH: CAITLYN TAYLOR 09/16/13 @ 9:12AM BY RUSCOE A.     Performed at [facility]   Report Status 09/16/2013 FINAL   Final   Organism ID, Bacteria METHICILLIN RESISTANT STAPHYLOCOCCUS AUREUS   Final     Scheduled Meds: . aspirin EC  81 mg Oral Daily  . ceFEPime (MAXIPIME) IV  2 g Intravenous Q12H  . enoxaparin (LOVENOX) injection  70 mg Subcutaneous Q24H  . insulin aspart   0-9 Units Subcutaneous TID WC  . levothyroxine  125 mcg Oral QAC breakfast  . polyethylene glycol  17 g Oral Daily  . vancomycin  1,750 mg Intravenous Q24H   Continuous Infusions:   Faye Ramsay, MD  [facility] Pager [phone]  If 7PM-7AM, please contact night-coverage [email or web address] Password [email or web address] 09/16/2013, 11:58 AM   LOS: 3  days

## 2013-09-16 NOTE — Progress Notes (Signed)
Subjective  Pain is less but not gone.    Objective: Vital signs in last 24 hours: Temp:  [97.7 F (36.5 C)-98 F (36.7 C)] 97.7 F (36.5 C) (01/11 0521) Pulse Rate:  [96-105] 96 (01/11 0521) Resp:  [20-22] 20 (01/11 0521) BP: (121-144)/(42-58) 144/56 mmHg (01/11 0521) SpO2:  [93 %-100 %] 100 % (01/11 0521) Weight:  [135.308 kg (298 lb 4.8 oz)] 135.308 kg (298 lb 4.8 oz) (01/11 0521)  Intake/Output from previous day: 01/10 0701 - 01/11 0700 In: 650 [P.O.:600; IV Piggyback:50] Out: 3350 [Urine:3350] Intake/Output this shift:     Recent Labs  09/13/13 1230 09/13/13 1252 09/14/13 0410 09/16/13 0400  HGB 10.2* 11.2* 10.0* 9.7*    Recent Labs  09/14/13 0410 09/16/13 0400  WBC 13.9* 10.0  RBC 3.36* 3.27*  HCT 30.7* 29.5*  PLT 252 266    Recent Labs  09/15/13 1606 09/16/13 0400  NA 135* 136*  K 4.0 4.3  CL 98 100  CO2 24 26  BUN 35* 31*  CREATININE 1.17* 0.95  GLUCOSE 150* 131*  CALCIUM 8.4 8.0*   No results found for this basename: LABPT, INR,  in the last 72 hours  Less redness still tender   Assessment/Plan: Dicussed with patient and husband need for PIC line and 2 stage procedure. Has MRSA inf TKA and multiple risk factors. Tomorrow I and D  cement spacer. Will need SNF afterwards. Will pre op today discissed with Dr Rogers Blocker stop zosyn but continue Vanc.  Neal Oshea ANDREW 09/16/2013, 10:04 AM

## 2013-09-16 NOTE — Progress Notes (Signed)
ANTIBIOTIC CONSULT NOTE - FOLLOW UP  Pharmacy Consult for Vancomycin Indication: septic arthritis/PJI  Labs:  Recent Labs  09/14/13 0410 09/15/13 1606 09/16/13 0400  WBC 13.9*  --  10.0  HGB 10.0*  --  9.7*  PLT 252  --  266  CREATININE 1.87* 1.17* 0.95   Estimated Creatinine Clearance: 70.1 ml/min (by C-G formula based on Cr of 0.95).  Recent Labs  09/16/13 1907  VANCOTROUGH 11.7     Assessment: 59 yoF with PMHx significant for BL knee replacements presents with progressively worsening R knee and ankle pain, concerning for septic arthritis or prosthetic joint infection.  Pharmacy consulted to dose vancomycin and cefepime.  1/9 Joint aspirate: MRSA  Renal:  SCr initially elevated, but now improving.  SCr 0.95 with CrCl ~ 70 ml/min (CG) and ~ 59 ml/min (Normalized)  Vancomycin trough level: 11.7, below goal range on vanc 1750mg  q24h  Goal of Therapy:  Vancomycin trough level 15-20 mcg/ml  Plan:   Increase to Vancomycin 1250mg  IV q12h.  Recheck Vanc trough at steady state as needed.  Follow up renal fxn and culture results.  Gretta Arab PharmD, BCPS Pager [phone] 09/16/2013 8:07 PM

## 2013-09-17 ENCOUNTER — Encounter (HOSPITAL_COMMUNITY): Payer: Medicare Other | Admitting: Certified Registered"

## 2013-09-17 ENCOUNTER — Inpatient Hospital Stay (HOSPITAL_COMMUNITY): Payer: Medicare Other | Admitting: Certified Registered"

## 2013-09-17 ENCOUNTER — Encounter (HOSPITAL_COMMUNITY)
Admission: EM | Disposition: A | Discharge status: Skilled Nursing Facility | Payer: Self-pay | Source: Home / Self Care | Attending: Internal Medicine

## 2013-09-17 LAB — GLUCOSE, CAPILLARY
GLUCOSE-CAPILLARY: 104 mg/dL — AB (ref 70–99)
Glucose-Capillary: 113 mg/dL — ABNORMAL HIGH (ref 70–99)
Glucose-Capillary: 116 mg/dL — ABNORMAL HIGH (ref 70–99)
Glucose-Capillary: 184 mg/dL — ABNORMAL HIGH (ref 70–99)

## 2013-09-17 LAB — BASIC METABOLIC PANEL
BUN: 24 mg/dL — AB (ref 6–23)
CHLORIDE: 99 meq/L (ref 96–112)
CO2: 26 mEq/L (ref 19–32)
Calcium: 8.1 mg/dL — ABNORMAL LOW (ref 8.4–10.5)
Creatinine, Ser: 0.81 mg/dL (ref 0.50–1.10)
GFR calc non Af Amer: 70 mL/min — ABNORMAL LOW (ref >90)
GFR, EST AFRICAN AMERICAN: 82 mL/min — AB (ref >90)
Glucose, Bld: 124 mg/dL — ABNORMAL HIGH (ref 70–99)
POTASSIUM: 4.5 meq/L (ref 3.7–5.3)
Sodium: 137 mEq/L (ref 137–147)

## 2013-09-17 LAB — CBC
HEMATOCRIT: 30.7 % — AB (ref 36.0–46.0)
Hemoglobin: 10.1 g/dL — ABNORMAL LOW (ref 12.0–15.0)
MCH: 30.1 pg (ref 26.0–34.0)
MCHC: 32.9 g/dL (ref 30.0–36.0)
MCV: 91.6 fL (ref 78.0–100.0)
PLATELETS: 271 10*3/uL (ref 150–400)
RBC: 3.35 MIL/uL — AB (ref 3.87–5.11)
RDW: 15.8 % — ABNORMAL HIGH (ref 11.5–15.5)
WBC: 10.8 10*3/uL — AB (ref 4.0–10.5)

## 2013-09-17 LAB — SEDIMENTATION RATE: SED RATE: 126 mm/h — AB (ref 0–22)

## 2013-09-17 LAB — PATHOLOGIST SMEAR REVIEW

## 2013-09-17 LAB — C-REACTIVE PROTEIN: CRP: 16.8 mg/dL — ABNORMAL HIGH (ref <0.60)

## 2013-09-17 LAB — MRSA PCR SCREENING: MRSA BY PCR: POSITIVE — AB

## 2013-09-17 SURGERY — REMOVAL, TOTAL ARTHROPLASTY HARDWARE, KNEE, WITH ANTIBIOTIC SPACER INSERTION
Anesthesia: General | Laterality: Right

## 2013-09-17 MED ORDER — CHLORHEXIDINE GLUCONATE CLOTH 2 % EX PADS
6.0000 | MEDICATED_PAD | Freq: Every day | CUTANEOUS | Status: AC
  Administered 2013-09-17 – 2013-09-21 (×5): 6 via TOPICAL

## 2013-09-17 MED ORDER — MUPIROCIN 2 % EX OINT
1.0000 "application " | TOPICAL_OINTMENT | Freq: Two times a day (BID) | CUTANEOUS | Status: AC
  Administered 2013-09-17 – 2013-09-21 (×9): 1 via NASAL
  Filled 2013-09-17: qty 22

## 2013-09-17 NOTE — Progress Notes (Signed)
Patient ID: Amy Burns, female   DOB: [birth year], 73 y.o.   MRN: [id number] Subjective:   Infected right total knee replacement    Patient reports pain as moderate at this point.  Ready to get her knee taken care of  Objective:   VITALS:   Filed Vitals:   09/17/13 0530  BP: 167/80  Pulse: 102  Temp: 98 F (36.7 C)  Resp: 20    Incision: dressing C/D/I Erythema around right knee  LABS  Recent Labs  09/16/13 0400 09/17/13 0404  HGB 9.7* 10.1*  HCT 29.5* 30.7*  WBC 10.0 10.8*  PLT 266 271     Recent Labs  09/15/13 1606 09/16/13 0400 09/17/13 0404  NA 135* 136* 137  K 4.0 4.3 4.5  BUN 35* 31* 24*  CREATININE 1.17* 0.95 0.81  GLUCOSE 150* 131* 124*    No results found for this basename: LABPT, INR,  in the last 72 hours   Assessment/Plan:   Infected right total knee  Plan: NPO To OR today for resection arthroplasty ESR and C-RP ordered  ID to follow Will need PIC IV 6 weeks

## 2013-09-17 NOTE — Anesthesia Preprocedure Evaluation (Deleted)
Anesthesia Evaluation  Patient identified by MRN, date of birth, ID band Patient awake    Reviewed: Allergy & Precautions, H&P , NPO status , Patient's Chart, lab work & pertinent test results  Airway Mallampati: III TM Distance: <3 FB Neck ROM: Full    Dental no notable dental hx.    Pulmonary sleep apnea ,  breath sounds clear to auscultation  + decreased breath sounds      Cardiovascular hypertension, Pt. on medications Rhythm:Regular Rate:Normal     Neuro/Psych negative neurological ROS  negative psych ROS   GI/Hepatic negative GI ROS, Neg liver ROS,   Endo/Other  diabetesHypothyroidism Morbid obesity  Renal/GU negative Renal ROS  negative genitourinary   Musculoskeletal negative musculoskeletal ROS (+)   Abdominal   Peds negative pediatric ROS (+)  Hematology negative hematology ROS (+)   Anesthesia Other Findings   Reproductive/Obstetrics negative OB ROS                           Anesthesia Physical  Anesthesia Plan  ASA: III  Anesthesia Plan: General   Post-op Pain Management:    Induction: Intravenous  Airway Management Planned: Oral ETT  Additional Equipment:   Intra-op Plan:   Post-operative Plan: Extubation in OR  Informed Consent: I have reviewed the patients History and Physical, chart, labs and discussed the procedure including the risks, benefits and alternatives for the proposed anesthesia with the patient or authorized representative who has indicated his/her understanding and acceptance.   Dental advisory given  Plan Discussed with: CRNA and Surgeon  Anesthesia Plan Comments:         Anesthesia Quick Evaluation

## 2013-09-17 NOTE — Progress Notes (Signed)
PT Cancellation Note  Patient Details Name: Amy Burns MRN: [id number] DOB: 05/03/1941   Cancelled Treatment:    Reason Eval/Treat Not Completed: Patient at procedure or test/unavailable (Surgery today.) Will follow.   Amy Burns 09/17/2013, 9:51 AM [phone]

## 2013-09-17 NOTE — Progress Notes (Signed)
Patient ID: Amy Burns, female   DOB: Feb 25, 1941, 73 y.o.   MRN: [id number]  [facility] PROGRESS NOTE  Amy Burns [id number] DOB: May 26, 1941 DOA: 09-16-2013 PCP: Eulas Post, MD  Brief narrative:  73 year old female with history of bilateral knee replacement by Dr. Gladstone Lighter several years ago now presents to [facility] ED with main concern of one week duration of progressively worsening right knee pain, throbbing and constant, radiating to the right ankle, 10/10 in severity, associated with erythema and swelling, worse with ambulation and somewhat improved with rest, otherwise with no specific alleviating factors, no similar events in the past. Pt denies fevers, chills, no numbness or tingling, no specific trauma to the area. In Ed, pt in mild distress due to pain, orthopedic team called for consultation and [facility] asked to admit for further evaluation and management, medical bed requested.   Assessment and Plan:  Active Problems:  Right knee pain  - appreciate ortho assistance, status post right knee joint aspiration  - fluid sent for analysis and positive for abundant MRSA  - continue ABX Vancomycin day #5  - analgesia as needed  - plan to take to OR today 01/12  - PICC line placement ordered  Leukocytosis  - most likely secondary to an acute illness  - placed on ABX as noted above  - CBC in AM  Mild vascular congestion  - CXR with developing pulmonary edema  - one dose of Lasix given and pt has responded well  - off IVF  Acute renal failure  - likely pre renal in etiology and secondary to acute illness  - Cr trending down and WNL this AM  - repeat BMP in AM  Anemia of chronic disease  - Hg stable and with no signs of acute bleeding  - CBC in AM  Diabetes mellitus  - continue to hold Metformin due to ARF  - pt on SSI for now  - A1C 6.5 (September 16, 2012)  Hypothyroidism  - continue synthroid  HTN  - continue to hold Lisinopril  - BP stable over the past 24 hours  -  monitor vitals and add antihypertensive regimen as indicated   Consultants:  Ortho  Procedures/Studies:  Dg Knee 1-2 Views Left 09-16-13 Expected appearance after bilateral knee arthroplasties. No acute hardware complication. Limited evaluation for joint effusion, especially on the right.  Antibiotics:  Vancomycin 09/16/22 -->  Maxipime 2022-09-16 --> 01/11  Code Status: Full  Family Communication: Pt at bedside  Disposition Plan: SNF recommended, SW consultation requested   HPI/Subjective: No events overnight.   Objective: Filed Vitals:   09/16/13 1435 09/16/13 2017 09/17/13 0530 09/17/13 0642  BP: 157/66 149/57 167/80   Pulse: 100 99 102   Temp: 98.4 F (36.9 C) 98.2 F (36.8 C) 98 F (36.7 C)   TempSrc: Oral Oral Oral   Resp: 20 16 20    Height:      Weight:    135.807 kg (299 lb 6.4 oz)  SpO2: 98% 94% 96%     Intake/Output Summary (Last 24 hours) at 09/17/13 0910 Last data filed at 09/16/13 2310  Gross per 24 hour  Intake    600 ml  Output   1150 ml  Net   -550 ml    Exam:   General:  Pt is alert, follows commands appropriately, not in acute distress  Cardiovascular: Regular rate and rhythm, S1/S2, no murmurs, no rubs, no gallops  Respiratory: Clear to auscultation bilaterally, no wheezing, no crackles, no rhonchi  Abdomen: Soft, non tender, non distended, bowel sounds present, no guarding  Extremities: Right knee TTP and swelling, improving   Neuro: Grossly nonfocal  Data Reviewed: Basic Metabolic Panel:  Recent Labs Lab 09/13/13 1252 09/14/13 0410 09/15/13 1606 09/16/13 0400 09/17/13 0404  NA 132* 132* 135* 136* 137  K 3.8 4.8 4.0 4.3 4.5  CL 97 94* 98 100 99  CO2  --  23 24 26 26   GLUCOSE 123* 142* 150* 131* 124*  BUN 31* 41* 35* 31* 24*  CREATININE 1.20* 1.87* 1.17* 0.95 0.81  CALCIUM  --  8.0* 8.4 8.0* 8.1*   CBC:  Recent Labs Lab 09/13/13 1230 09/13/13 1252 09/14/13 0410 09/16/13 0400 09/17/13 0404  WBC 14.6*  --  13.9* 10.0 10.8*   NEUTROABS 12.4*  --   --   --   --   HGB 10.2* 11.2* 10.0* 9.7* 10.1*  HCT 30.1* 33.0* 30.7* 29.5* 30.7*  MCV 89.9  --  91.4 90.2 91.6  PLT 207  --  252 266 271   CBG:  Recent Labs Lab 09/16/13 0826 09/16/13 1144 09/16/13 1720 09/16/13 2119 09/17/13 0801  GLUCAP 133* 125* 139* 128* 113*    Recent Results (from the past 240 hour(s))  ANAEROBIC CULTURE     Status: None   Collection Time    09/13/13  8:00 PM      Result Value Range Status   Specimen Description KNEE RIGHT KNEE JOINT   Final   Special Requests ANAC ONLY   Final   Gram Stain PENDING   Incomplete   Culture     Final   Value: NO ANAEROBES ISOLATED; CULTURE IN PROGRESS FOR 5 DAYS     Performed at [facility]   Report Status PENDING   Incomplete  BODY FLUID CULTURE     Status: None   Collection Time    09/14/13  4:27 PM      Result Value Range Status   Specimen Description KNEE   Final   Special Requests NONE   Final   Gram Stain     Final   Value: ABUNDANT WBC PRESENT,BOTH PMN AND MONONUCLEAR     MODERATE GRAM POSITIVE COCCI     IN PAIRS IN CLUSTERS Gram Stain Report Called to,Read Back By and Verified With: Gram Stain Report Called to,Read Back By and Verified With: DONNA A @1131  ON 09/15/13 BY SMIAS     Performed at [facility]   Culture     Final   Value: ABUNDANT METHICILLIN RESISTANT STAPHYLOCOCCUS AUREUS     Note: RIFAMPIN AND GENTAMICIN SHOULD NOT BE USED AS SINGLE DRUGS FOR TREATMENT OF STAPH INFECTIONS. CRITICAL RESULT CALLED TO, READ BACK BY AND VERIFIED WITH: CAITLYN TAYLOR 09/16/13 @ 9:12AM BY RUSCOE A.     Performed at [facility]   Report Status 09/16/2013 FINAL   Final   Organism ID, Bacteria METHICILLIN RESISTANT STAPHYLOCOCCUS AUREUS   Final  SURGICAL PCR SCREEN     Status: Abnormal   Collection Time    09/16/13  4:47 PM      Result Value Range Status   MRSA, PCR POSITIVE (*) NEGATIVE Final   Comment: RESULT CALLED TO, READ BACK BY AND VERIFIED WITH:      K.BUTCHEE AT 1818 ON 11JAN15 BY C.BONGEL   Staphylococcus aureus POSITIVE (*) NEGATIVE Final   Comment:            The Xpert SA Assay (FDA     approved  for NASAL specimens     in patients over 74 years of age),     is one component of     a comprehensive surveillance     program.  Test performance has     been validated by [employer] for patients greater     than or equal to 40 year old.     It is not intended     to diagnose infection nor to     guide or monitor treatment.     Scheduled Meds: . aspirin EC  81 mg Oral Daily  . insulin aspart  0-9 Units Subcutaneous TID WC  . levothyroxine  125 mcg Oral QAC breakfast  . polyethylene glycol  17 g Oral Daily  . vancomycin  1,250 mg Intravenous Q12H   Continuous Infusions:    Amy Ramsay, MD  [facility] Pager [phone]  If 7PM-7AM, please contact night-coverage [email or web address] Password [email or web address] 09/17/2013, 9:10 AM   LOS: 4 days

## 2013-09-17 NOTE — Progress Notes (Signed)
CSW continuing to follow for pt disposition planning.   Per chart, pt planned for surgery today.  Weekend CSW provided SNF bed offers and per weekend CSW note, pt and pt husband plan to make decision re: SNF following pt surgery.  CSW to follow up with pt and pt husband following pt surgery.   CSW to continue to follow to assist with disposition planning.  Drake Leach, MSW, [place] Social Work [phone]

## 2013-09-17 NOTE — Progress Notes (Signed)
Pt seen and evaluated in holding room. Here for knee surgery Dr. Alvan Dame.  Upon interview, patient is unable to saw more than 3-4 word sentences d/t sob. Admits to 2 pillow or greater othopnea.  PE: Chest crackles bilat  3+ pitting edema bilat.  CXR for 2 days ago shows interstitial edema.  A/P: CHF. Pt not optimized for general anesthesia today. Needs further treatment for sob. ?CHF.   ? Needs further diuresis. Would recheck CXR.  Will delay surgery for now. Dr. Alvan Dame aware and agrees.  Montez Hageman MD

## 2013-09-18 ENCOUNTER — Inpatient Hospital Stay (HOSPITAL_COMMUNITY): Payer: Medicare Other

## 2013-09-18 LAB — GLUCOSE, CAPILLARY
Glucose-Capillary: 116 mg/dL — ABNORMAL HIGH (ref 70–99)
Glucose-Capillary: 138 mg/dL — ABNORMAL HIGH (ref 70–99)
Glucose-Capillary: 161 mg/dL — ABNORMAL HIGH (ref 70–99)

## 2013-09-18 LAB — CBC
HCT: 32.5 % — ABNORMAL LOW (ref 36.0–46.0)
Hemoglobin: 10.6 g/dL — ABNORMAL LOW (ref 12.0–15.0)
MCH: 29.8 pg (ref 26.0–34.0)
MCHC: 32.6 g/dL (ref 30.0–36.0)
MCV: 91.3 fL (ref 78.0–100.0)
PLATELETS: 253 10*3/uL (ref 150–400)
RBC: 3.56 MIL/uL — ABNORMAL LOW (ref 3.87–5.11)
RDW: 15.7 % — AB (ref 11.5–15.5)
WBC: 10.3 10*3/uL (ref 4.0–10.5)

## 2013-09-18 LAB — BASIC METABOLIC PANEL
BUN: 24 mg/dL — ABNORMAL HIGH (ref 6–23)
CHLORIDE: 100 meq/L (ref 96–112)
CO2: 29 mEq/L (ref 19–32)
CREATININE: 0.83 mg/dL (ref 0.50–1.10)
Calcium: 8.3 mg/dL — ABNORMAL LOW (ref 8.4–10.5)
GFR calc Af Amer: 79 mL/min — ABNORMAL LOW (ref >90)
GFR calc non Af Amer: 68 mL/min — ABNORMAL LOW (ref >90)
Glucose, Bld: 128 mg/dL — ABNORMAL HIGH (ref 70–99)
Potassium: 4.4 mEq/L (ref 3.7–5.3)
SODIUM: 138 meq/L (ref 137–147)

## 2013-09-18 LAB — VANCOMYCIN, TROUGH: Vancomycin Tr: 22.6 ug/mL — ABNORMAL HIGH (ref 10.0–20.0)

## 2013-09-18 MED ORDER — ENOXAPARIN SODIUM 40 MG/0.4ML ~~LOC~~ SOLN
40.0000 mg | SUBCUTANEOUS | Status: DC
  Administered 2013-09-18 – 2013-09-19 (×2): 40 mg via SUBCUTANEOUS
  Filled 2013-09-18 (×3): qty 0.4

## 2013-09-18 MED ORDER — SODIUM CHLORIDE 0.9 % IJ SOLN
10.0000 mL | INTRAMUSCULAR | Status: DC | PRN
  Administered 2013-09-21 – 2013-09-26 (×6): 10 mL

## 2013-09-18 MED ORDER — FUROSEMIDE 10 MG/ML IJ SOLN
40.0000 mg | Freq: Every day | INTRAMUSCULAR | Status: DC
  Administered 2013-09-18 – 2013-09-19 (×2): 40 mg via INTRAVENOUS
  Filled 2013-09-18 (×2): qty 4

## 2013-09-18 MED ORDER — VANCOMYCIN HCL IN DEXTROSE 1-5 GM/200ML-% IV SOLN
1000.0000 mg | Freq: Two times a day (BID) | INTRAVENOUS | Status: DC
  Administered 2013-09-18 – 2013-09-21 (×6): 1000 mg via INTRAVENOUS
  Filled 2013-09-18 (×8): qty 200

## 2013-09-18 NOTE — Progress Notes (Signed)
Peripherally Inserted Central Catheter/Midline Placement  The IV Nurse has discussed with the patient and/or persons authorized to consent for the patient, the purpose of this procedure and the potential benefits and risks involved with this procedure.  The benefits include less needle sticks, lab draws from the catheter and patient may be discharged home with the catheter.  Risks include, but not limited to, infection, bleeding, blood clot (thrombus formation), and puncture of an artery; nerve damage and irregular heat beat.  Alternatives to this procedure were also discussed.  PICC/Midline Placement Documentation        Amy Burns 09/18/2013, 3:34 PM

## 2013-09-18 NOTE — Progress Notes (Signed)
PT Cancellation Note  Patient Details Name: Amy Burns MRN: [id number] DOB: 1940/11/13   Cancelled Treatment:    Reason Eval/Treat Not Completed: Patient at procedure or test/unavailable   Claretha Cooper 09/18/2013, 3:58 PM Tresa Endo PT [phone]

## 2013-09-18 NOTE — Care Management Note (Signed)
   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   (If response is "NO", the following Medicare IM given date fields will be blank) Date Medicare IM given:   Date Additional Medicare IM given:    Discharge Disposition:    Per UR Regulation:  Reviewed for med. necessity/level of care/duration of stay  If discussed at [place] of Stay Meetings, dates discussed:    Comments:  09/18/13 [place] Chart reviewed for continued INPT stay. Pt disposition to SNF. CSW following.

## 2013-09-18 NOTE — Procedures (Signed)
Successful placement of right brachial vein approach 42 cm dual lumen PICC line with tip at the superior caval-atrial junction.  The PICC line is ready for immediate use.

## 2013-09-18 NOTE — Progress Notes (Signed)
CSW continuing to follow for disposition planning.  CSW received notification that pt surgery was delayed secondary to CHF.   CSW met with pt and pt husband at bedside to provide support. Pt appears in good spirits, but does report yesterday was a difficult day and pt eager to take it day by day until pt able to have surgery.  Pt and pt husband discussed that once pt is able to have surgery then plan at discharge is for [facility] and [facility] for rehab.  CSW notified [facility] and [place] and will continue to update facility on pt progress.  CSW to continue to follow and facilitate pt discharge needs to [facility] and [facility] when pt medically ready for discharge.  Drake Leach, MSW,  Social Work [phone]

## 2013-09-18 NOTE — Progress Notes (Signed)
ANTIBIOTIC CONSULT NOTE - FOLLOW UP  Pharmacy Consult for Vancomycin Indication: septic arthritis/PJI   Recent Labs  09/16/13 0400 09/17/13 0404 09/18/13 0410  WBC 10.0 10.8* 10.3  HGB 9.7* 10.1* 10.6*  PLT 266 271 253  CREATININE 0.95 0.81 0.83   Estimated Creatinine Clearance: 80.4 ml/min (by C-G formula based on Cr of 0.83).  Recent Labs  09/16/13 1907 09/18/13 2009  VANCOTROUGH 11.7 22.6*   Anti-infectives: 1/8 >> Vanc >> 1/8 >> Cefepime >> 1/11  Assessment: 28 yoF with PMHx significant for BL knee replacements presents with progressively worsening R knee and ankle pain, concerning for septic arthritis or prosthetic joint infection. Pharmacy consulted to dose vancomycin.  Day # 6 of vancomycin  Micro:  Joint aspirate: MRSA  Renal: SCr initially elevated, but now improved.  SCr 0.83 with CrCl ~ 80 ml/min (CG) and ~ 70 ml/min (Normalized)   Vancomycin trough level: 22.6, slightly above goal range on vancomycin 1250mg  q12h.   Goal of Therapy:  Vancomycin trough level 15-20 mcg/ml  Plan:   Decrease to Vancomycin 1g IV q12h.  Recheck Vanc trough at steady state as needed.  Follow up renal fxn and culture results.   Gretta Arab PharmD, BCPS Pager [phone] 09/18/2013 9:12 PM

## 2013-09-18 NOTE — Progress Notes (Signed)
Patient ID: Amy Burns, female   DOB: 1941-03-12, 73 y.o.   MRN: [id number]  [facility] PROGRESS NOTE  Amy Burns [id number]:[id number] DOB: Jul 07, 1941 DOA: Sep 24, 2013 PCP: Eulas Post, MD  Brief narrative:  73 year old female with history of bilateral knee replacement by Dr. Gladstone Lighter several years ago now presents to [facility] ED with main concern of one week duration of progressively worsening right knee pain, throbbing and constant, radiating to the right ankle, 10/10 in severity, associated with erythema and swelling, worse with ambulation and somewhat improved with rest, otherwise with no specific alleviating factors, no similar events in the past. Pt denies fevers, chills, no numbness or tingling, no specific trauma to the area. In Ed, pt in mild distress due to pain, orthopedic team called for consultation and [facility] asked to admit for further evaluation and management, medical bed requested.   Assessment and Plan:  Active Problems:  Right knee pain  - appreciate ortho assistance, status post right knee joint aspiration 2013/09/24 - fluid sent for analysis and positive for abundant MRSA  - continue ABX Vancomycin day #6 - analgesia as needed  - plan to take to OR today 01/12 but plans cancelled due to acute CHF diastolic failure - PICC line placement ordered  - place on Lasix daily IV, CXR in AM, daily weights and I's/O's  Leukocytosis  - most likely secondary to an acute illness  - placed on ABX as noted above  - CBC in AM  Pulmonary vascular congestion  - CXR with developing pulmonary edema 09/14/2013 - one dose of Lasix 40 mg IV given 01/09 and pt responded well - pt more congested on exam today and Lasix restarted via IV, check CXR in AM - may need 2 D ECHO Acute renal failure  - likely pre renal in etiology and secondary to acute illness  - Cr trending down and WNL this AM  - monitor closely as pt is on Lasix  - repeat BMP in AM  Anemia of chronic disease  - Hg stable and  with no signs of acute bleeding  - CBC in AM  Diabetes mellitus  - continue to hold Metformin due to ARF  - pt on SSI for now  - A1C 6.5 (September 24, 2012)  Hypothyroidism  - continue synthroid  HTN  - continue to hold Lisinopril as BP stable over the past 24 hours  - monitor vitals and add antihypertensive regimen as indicated   Consultants:  Ortho  Procedures/Studies:  Dg Knee 1-2 Views Left September 24, 2013 Expected appearance after bilateral knee arthroplasties. No acute hardware complication. Limited evaluation for joint effusion, especially on the right.  Antibiotics:  Vancomycin Sep 24, 2022 -->  Maxipime 09-24-2022 --> 01/11  Code Status: Full  Family Communication: Pt at bedside  Disposition Plan: SNF recommended, SW consultation requested   HPI/Subjective: No events overnight.   Objective: Filed Vitals:   09/17/13 0642 09/17/13 1437 09/17/13 2100 09/18/13 0620  BP:  131/65 147/51 121/58  Pulse:  90 104 87  Temp:  98.5 F (36.9 C) 99.1 F (37.3 C) 97.9 F (36.6 C)  TempSrc:  Oral Oral Oral  Resp:  16 20 20   Height:      Weight: 135.807 kg (299 lb 6.4 oz)     SpO2:  98% 97% 99%    Intake/Output Summary (Last 24 hours) at 09/18/13 1408 Last data filed at 09/18/13 0944  Gross per 24 hour  Intake   1330 ml  Output    400 ml  Net    930 ml    Exam:   General:  Pt is alert, follows commands appropriately, not in acute distress  Cardiovascular: Regular rate and rhythm, S1/S2, no murmurs, no rubs, no gallops  Respiratory: Clear to auscultation bilaterally, no wheezing, bibasilar crackles   Abdomen: Soft, non tender, non distended, bowel sounds present, no guarding  Extremities: +2 bilateral LE pitting edema, pulses DP and PT palpable bilaterally  Neuro: Grossly nonfocal  Data Reviewed: Basic Metabolic Panel:  Recent Labs Lab 09/14/13 0410 09/15/13 1606 09/16/13 0400 09/17/13 0404 09/18/13 0410  NA 132* 135* 136* 137 138  K 4.8 4.0 4.3 4.5 4.4  CL 94* 98 100 99 100   CO2 23 24 26 26 29   GLUCOSE 142* 150* 131* 124* 128*  BUN 41* 35* 31* 24* 24*  CREATININE 1.87* 1.17* 0.95 0.81 0.83  CALCIUM 8.0* 8.4 8.0* 8.1* 8.3*   CBC:  Recent Labs Lab 09/13/13 1230 09/13/13 1252 09/14/13 0410 09/16/13 0400 09/17/13 0404 09/18/13 0410  WBC 14.6*  --  13.9* 10.0 10.8* 10.3  NEUTROABS 12.4*  --   --   --   --   --   HGB 10.2* 11.2* 10.0* 9.7* 10.1* 10.6*  HCT 30.1* 33.0* 30.7* 29.5* 30.7* 32.5*  MCV 89.9  --  91.4 90.2 91.6 91.3  PLT 207  --  252 266 271 253     Recent Labs Lab 09/17/13 1148 09/17/13 1616 09/17/13 2103 09/18/13 0747 09/18/13 1157  GLUCAP 116* 104* 184* 116* 138*    Recent Results (from the past 240 hour(s))  ANAEROBIC CULTURE     Status: None   Collection Time    09/13/13  8:00 PM      Result Value Range Status   Specimen Description KNEE RIGHT KNEE JOINT   Final   Special Requests ANAC ONLY   Final   Gram Stain PENDING   Incomplete   Culture     Final   Value: NO ANAEROBES ISOLATED; CULTURE IN PROGRESS FOR 5 DAYS     Performed at [facility]   Report Status PENDING   Incomplete  BODY FLUID CULTURE     Status: None   Collection Time    09/14/13  4:27 PM      Result Value Range Status   Specimen Description KNEE   Final   Special Requests NONE   Final   Gram Stain     Final   Value: ABUNDANT WBC PRESENT,BOTH PMN AND MONONUCLEAR     MODERATE GRAM POSITIVE COCCI     IN PAIRS IN CLUSTERS Gram Stain Report Called to,Read Back By and Verified With: Gram Stain Report Called to,Read Back By and Verified With: DONNA A @1131  ON 09/15/13 BY SMIAS     Performed at [facility]   Culture     Final   Value: ABUNDANT METHICILLIN RESISTANT STAPHYLOCOCCUS AUREUS     Note: RIFAMPIN AND GENTAMICIN SHOULD NOT BE USED AS SINGLE DRUGS FOR TREATMENT OF STAPH INFECTIONS. CRITICAL RESULT CALLED TO, READ BACK BY AND VERIFIED WITH: CAITLYN TAYLOR 09/16/13 @ 9:12AM BY RUSCOE A.     Performed at [facility]   Report  Status 09/16/2013 FINAL   Final   Organism ID, Bacteria METHICILLIN RESISTANT STAPHYLOCOCCUS AUREUS   Final  SURGICAL PCR SCREEN     Status: Abnormal   Collection Time    09/16/13  4:47 PM      Result Value Range Status   MRSA, PCR  POSITIVE (*) NEGATIVE Final   Comment: RESULT CALLED TO, READ BACK BY AND VERIFIED WITH:     K.BUTCHEE AT 1818 ON 11JAN15 BY C.BONGEL   Staphylococcus aureus POSITIVE (*) NEGATIVE Final   Comment:            The Xpert SA Assay (FDA     approved for NASAL specimens     in patients over 70 years of age),     is one component of     a comprehensive surveillance     program.  Test performance has     been validated by [employer] for patients greater     than or equal to 47 year old.     It is not intended     to diagnose infection nor to     guide or monitor treatment.  MRSA PCR SCREENING     Status: Abnormal   Collection Time    09/17/13  7:33 AM      Result Value Range Status   MRSA by PCR POSITIVE (*) NEGATIVE Final   Comment:            The GeneXpert MRSA Assay (FDA     approved for NASAL specimens     only), is one component of a     comprehensive MRSA colonization     surveillance program. It is not     intended to diagnose MRSA     infection nor to guide or     monitor treatment for     MRSA infections.     RESULT CALLED TO, READ BACK BY AND VERIFIED WITH:     E. MACABUAG RN AT 0940 ON 1.12.15 BY SHUEA     Scheduled Meds: . aspirin EC  81 mg Oral Daily  . Chlorhexidine Gluconate Cloth  6 each Topical Q0600  . enoxaparin (LOVENOX) injection  40 mg Subcutaneous Q24H  . insulin aspart  0-9 Units Subcutaneous TID WC  . levothyroxine  125 mcg Oral QAC breakfast  . mupirocin ointment  1 application Nasal BID  . polyethylene glycol  17 g Oral Daily  . vancomycin  1,250 mg Intravenous Q12H   Continuous Infusions:    Faye Ramsay, MD  [facility] Pager [phone]  If 7PM-7AM, please contact night-coverage [email or web address] Password  [email or web address] 09/18/2013, 2:08 PM   LOS: 5 days

## 2013-09-18 NOTE — Progress Notes (Signed)
Unsuccessful at inserting PICC via right arm.   Spoke with Tiffany in Interventional Radiology to let her know I was unsuccessful and referring her to them.  Primary RN made aware also.

## 2013-09-19 ENCOUNTER — Inpatient Hospital Stay (HOSPITAL_COMMUNITY): Payer: Medicare Other

## 2013-09-19 LAB — BASIC METABOLIC PANEL
BUN: 24 mg/dL — ABNORMAL HIGH (ref 6–23)
CO2: 30 mEq/L (ref 19–32)
Calcium: 8 mg/dL — ABNORMAL LOW (ref 8.4–10.5)
Chloride: 100 mEq/L (ref 96–112)
Creatinine, Ser: 0.95 mg/dL (ref 0.50–1.10)
GFR, EST AFRICAN AMERICAN: 67 mL/min — AB (ref >90)
GFR, EST NON AFRICAN AMERICAN: 58 mL/min — AB (ref >90)
Glucose, Bld: 119 mg/dL — ABNORMAL HIGH (ref 70–99)
POTASSIUM: 4.5 meq/L (ref 3.7–5.3)
SODIUM: 139 meq/L (ref 137–147)

## 2013-09-19 LAB — ANAEROBIC CULTURE

## 2013-09-19 LAB — CBC
HCT: 30.3 % — ABNORMAL LOW (ref 36.0–46.0)
HEMOGLOBIN: 9.7 g/dL — AB (ref 12.0–15.0)
MCH: 29.2 pg (ref 26.0–34.0)
MCHC: 32 g/dL (ref 30.0–36.0)
MCV: 91.3 fL (ref 78.0–100.0)
Platelets: 237 10*3/uL (ref 150–400)
RBC: 3.32 MIL/uL — ABNORMAL LOW (ref 3.87–5.11)
RDW: 15.8 % — AB (ref 11.5–15.5)
WBC: 10.2 10*3/uL (ref 4.0–10.5)

## 2013-09-19 LAB — GLUCOSE, CAPILLARY
Glucose-Capillary: 106 mg/dL — ABNORMAL HIGH (ref 70–99)
Glucose-Capillary: 124 mg/dL — ABNORMAL HIGH (ref 70–99)
Glucose-Capillary: 95 mg/dL (ref 70–99)
Glucose-Capillary: 133 mg/dL — ABNORMAL HIGH (ref 70–99)

## 2013-09-19 MED ORDER — HYDRALAZINE HCL 20 MG/ML IJ SOLN
10.0000 mg | Freq: Four times a day (QID) | INTRAMUSCULAR | Status: DC | PRN
  Filled 2013-09-19: qty 1

## 2013-09-19 MED ORDER — HYDRALAZINE HCL 10 MG PO TABS
10.0000 mg | ORAL_TABLET | Freq: Three times a day (TID) | ORAL | Status: DC
  Administered 2013-09-19 – 2013-09-20 (×4): 10 mg via ORAL
  Filled 2013-09-19 (×10): qty 1

## 2013-09-19 MED ORDER — BISACODYL 5 MG PO TBEC
10.0000 mg | DELAYED_RELEASE_TABLET | Freq: Once | ORAL | Status: AC
  Administered 2013-09-19: 10 mg via ORAL
  Filled 2013-09-19: qty 2

## 2013-09-19 MED ORDER — FUROSEMIDE 10 MG/ML IJ SOLN
40.0000 mg | Freq: Two times a day (BID) | INTRAMUSCULAR | Status: DC
  Administered 2013-09-19 – 2013-09-20 (×2): 40 mg via INTRAVENOUS
  Filled 2013-09-19 (×4): qty 4

## 2013-09-19 MED ORDER — SODIUM CHLORIDE 0.9 % IJ SOLN
10.0000 mL | Freq: Two times a day (BID) | INTRAMUSCULAR | Status: DC
  Administered 2013-09-19 (×2): 10 mL via INTRAVENOUS
  Administered 2013-09-20: 20 mL via INTRAVENOUS
  Administered 2013-09-20 – 2013-09-24 (×3): 10 mL via INTRAVENOUS
  Administered 2013-09-25: 3 mL via INTRAVENOUS
  Administered 2013-09-25 – 2013-09-26 (×2): 10 mL via INTRAVENOUS

## 2013-09-19 MED ORDER — ALTEPLASE 100 MG IV SOLR
2.0000 mg | Freq: Once | INTRAVENOUS | Status: AC
  Administered 2013-09-19: 2 mg
  Filled 2013-09-19: qty 2

## 2013-09-19 NOTE — Progress Notes (Signed)
Patient ID: Amy Burns, female   DOB: [birth year], 73 y.o.   MRN: [id number]  Pending further medical stabilization plan to go to OR Friday am for resection arthroplasty, placement of antibiotic spacer - scheduled  NPO after MN Thursday Consent will be reordered  Will confirm with primary team Thursday as to her medical stability particularly as it pertains to her fluid status  Patient and husband aware of plans to treat infected right TKR

## 2013-09-19 NOTE — Progress Notes (Signed)
PT Cancellation Note  Patient Details Name: Amy Burns MRN: [id number] DOB: Jun 15, 1941   Cancelled Treatment:    Reason Eval/Treat Not Completed: Medical issues which prohibited therapy (pt reports getting up to bathroom, walking a little. Will determine next visit  if PT will sign off  until surgery.)   Claretha Cooper 09/19/2013, 3:52 PM Tresa Endo PT [phone]

## 2013-09-19 NOTE — Progress Notes (Addendum)
[facility] PROGRESS NOTE  Amy Burns EXB:[id number] DOB: 05-31-1941 DOA: 10-04-2013 PCP: Eulas Post, MD  Brief narrative: 73 year old female with history of bilateral knee replacement by Dr. Gladstone Lighter several years ago now presents to [facility] ED 10/04/2013 with main concern of one week duration of progressively worsening right knee pain, throbbing and constant, radiating to the right ankle, 10/10 in severity, associated with erythema and swelling, worse with ambulation and somewhat improved with rest, otherwise with no specific alleviating factors, no similar events in the past. Pt denied fevers, chills, numbness or tingling or specific trauma to the area. In ED, pt was in mild distress due to pain, orthopedic team called for further input on management.   Assessment and Plan:   Principal Problem:  Right knee pain, septic joint effusion  - status post right knee joint aspiration 10/04/13  - joint fluid positive for MRSA; will need long term antibiotics; PICC line placed - continue vancomycin, day #7 - pain management with dilaudid 1 mg every 2 hours IV PRN severe pain and norco PO PRN moderate pain - plans to take to OR for resection arthroplasty on hold until vascular congestion resolves  - please note that pt expressed desire to hold off on any procedures as long as possible  Active Problems: Leukocytosis  - most likely secondary to septic joint effusion - now resolved - continue vancomycin as noted above Pulmonary vascular congestion  - CXR with developing pulmonary edema 09/14/2013 and confirmed 09/19/2013 - one dose of Lasix 40 mg IV given 01/09 and pt responded well  - pt was more congested on exam 09/18/2013 and Lasix restarted via IV - will increase the frequency from 40 mg IV QD to 40 mg IV BID - check 2 D ECHO, order placed - monitor daily weights, I's and O's - weight today 297 lbs Acute renal failure  - likely pre renal in etiology and secondary to acute illness  -  Cr trending down and WNL  - monitor closely as pt is on Lasix  - will repeat BMP in AM  Anemia of chronic disease  - Hg stable and with no signs of acute bleeding  - hemoglobin 9.7 this am Diabetes mellitus  - continue to hold Metformin due to ARF  - pt on SSI for now  - A1C 6.5 (2012/10/04)  - CBG's in past 24 hours: 161, 106, 124 Hpothyroidism  - continue synthroid  HTN  - continue to hold Lisinopril due to renal insufficiency (even though Cr improved at this time) - will add hydralazine PRN for better BP control - monitor vitals per unit protocol  Consultants:  Ortho  Procedures/Studies:  Dg Knee 1-2 Views Left 10-04-13 Expected appearance after bilateral knee arthroplasties. No acute hardware complication. Limited evaluation for joint effusion, especially on the right.  Antibiotics:  Vancomycin 10/04/22 -->  Maxipime 10/04/2022 --> 01/11  Code Status: Full  DVT prophylaxis: Lovenox  Family Communication: Pt and husband at bedside  Disposition Plan: SNF recommended, SW assisting D/C plan  HPI/Subjective: No events overnight.   Objective: Filed Vitals:   09/18/13 0620 09/18/13 1400 09/18/13 2035 09/19/13 0635  BP: 121/58 174/70 136/53 152/63  Pulse: 87 97 99 99  Temp: 97.9 F (36.6 C) 98.1 F (36.7 C) 98.5 F (36.9 C) 98.5 F (36.9 C)  TempSrc: Oral Oral Oral Oral  Resp: 20 20 20 20   Height:      Weight:      SpO2: 99% 98% 100% 97%  Intake/Output Summary (Last 24 hours) at 09/19/13 1117 Last data filed at 09/19/13 0900  Gross per 24 hour  Intake   1290 ml  Output   1150 ml  Net    140 ml   Exam:  General: Pt is alert, follows commands appropriately, not in acute distress  Cardiovascular: Regular rate and rhythm, S1/S2, no murmurs, no rubs, no gallops  Respiratory: Clear to auscultation bilaterally, no wheezing, bibasilar crackles  Abdomen: Soft, non tender, non distended, bowel sounds present, no guarding  Extremities: +2 bilateral LE pitting edema, pulses  DP and PT palpable bilaterally  Neuro: Grossly nonfocal   Data Reviewed: Basic Metabolic Panel:  Recent Labs Lab 09/15/13 1606 09/16/13 0400 09/17/13 0404 09/18/13 0410 09/19/13 0545  NA 135* 136* 137 138 139  K 4.0 4.3 4.5 4.4 4.5  CL 98 100 99 100 100  CO2 24 26 26 29 30   GLUCOSE 150* 131* 124* 128* 119*  BUN 35* 31* 24* 24* 24*  CREATININE 1.17* 0.95 0.81 0.83 0.95  CALCIUM 8.4 8.0* 8.1* 8.3* 8.0*   CBC:  Recent Labs Lab 09/13/13 1230  09/14/13 0410 09/16/13 0400 09/17/13 0404 09/18/13 0410 09/19/13 0545  WBC 14.6*  --  13.9* 10.0 10.8* 10.3 10.2  NEUTROABS 12.4*  --   --   --   --   --   --   HGB 10.2*  < > 10.0* 9.7* 10.1* 10.6* 9.7*  HCT 30.1*  < > 30.7* 29.5* 30.7* 32.5* 30.3*  MCV 89.9  --  91.4 90.2 91.6 91.3 91.3  PLT 207  --  252 266 271 253 237  < > = values in this interval not displayed.  CBG:  Recent Labs Lab 09/18/13 0747 09/18/13 1157 09/18/13 2031 09/19/13 0717 09/19/13 1057  GLUCAP 116* 138* 161* 106* 124*    Recent Results (from the past 240 hour(s))  ANAEROBIC CULTURE     Status: None   Collection Time    09/13/13  8:00 PM      Result Value Range Status   Specimen Description KNEE RIGHT KNEE JOINT   Final   Special Requests ANAC ONLY   Final   Gram Stain     Final   Value: ABUNDANT WBC PRESENT,BOTH PMN AND MONONUCLEAR     NO SQUAMOUS EPITHELIAL CELLS SEEN     MODERATE GRAM POSITIVE COCCI     IN PAIRS IN CLUSTERS     Performed at [facility]   Culture     Final   Value: NO ANAEROBES ISOLATED     Performed at [facility]   Report Status 09/19/2013 FINAL   Final  BODY FLUID CULTURE     Status: None   Collection Time    09/14/13  4:27 PM      Result Value Range Status   Specimen Description KNEE   Final   Special Requests NONE   Final   Gram Stain     Final   Value: ABUNDANT WBC PRESENT,BOTH PMN AND MONONUCLEAR     MODERATE GRAM POSITIVE COCCI     IN PAIRS IN CLUSTERS Gram Stain Report Called to,Read  Back By and Verified With: Gram Stain Report Called to,Read Back By and Verified With: DONNA A @1131  ON 09/15/13 BY SMIAS     Performed at [facility]   Culture     Final   Value: ABUNDANT METHICILLIN RESISTANT STAPHYLOCOCCUS AUREUS     Note: RIFAMPIN AND GENTAMICIN SHOULD NOT BE USED  AS SINGLE DRUGS FOR TREATMENT OF STAPH INFECTIONS. CRITICAL RESULT CALLED TO, READ BACK BY AND VERIFIED WITH: CAITLYN TAYLOR 09/16/13 @ 9:12AM BY RUSCOE A.     Performed at [facility]   Report Status 09/16/2013 FINAL   Final   Organism ID, Bacteria METHICILLIN RESISTANT STAPHYLOCOCCUS AUREUS   Final  SURGICAL PCR SCREEN     Status: Abnormal   Collection Time    09/16/13  4:47 PM      Result Value Range Status   MRSA, PCR POSITIVE (*) NEGATIVE Final   Comment: RESULT CALLED TO, READ BACK BY AND VERIFIED WITH:     K.BUTCHEE AT 1818 ON 11JAN15 BY C.BONGEL   Staphylococcus aureus POSITIVE (*) NEGATIVE Final  MRSA PCR SCREENING     Status: Abnormal   Collection Time    09/17/13  7:33 AM      Result Value Range Status   MRSA by PCR POSITIVE (*) NEGATIVE Final     Scheduled Meds: . aspirin EC  81 mg Oral Daily  . Chlorhexidine Gluconate Cloth  6 each Topical Q0600  . enoxaparin (LOVENOX) injection  40 mg Subcutaneous Q24H  . furosemide  40 mg Intravenous Daily  . insulin aspart  0-9 Units Subcutaneous TID WC  . levothyroxine  125 mcg Oral QAC breakfast  . mupirocin ointment  1 application Nasal BID  . polyethylene glycol  17 g Oral Daily  . sodium chloride  10 mL Intravenous Q12H  . vancomycin  1,000 mg Intravenous Q12H   Continuous Infusions:    Faye Ramsay, MD  [facility] Pager [phone]  If 7PM-7AM, please contact night-coverage [email or web address] Password [email or web address] 09/19/2013, 11:17 AM   LOS: 6 days

## 2013-09-20 DIAGNOSIS — E119 Type 2 diabetes mellitus without complications: Secondary | ICD-10-CM

## 2013-09-20 LAB — GLUCOSE, CAPILLARY
GLUCOSE-CAPILLARY: 140 mg/dL — AB (ref 70–99)
Glucose-Capillary: 111 mg/dL — ABNORMAL HIGH (ref 70–99)
Glucose-Capillary: 124 mg/dL — ABNORMAL HIGH (ref 70–99)
Glucose-Capillary: 165 mg/dL — ABNORMAL HIGH (ref 70–99)

## 2013-09-20 LAB — BASIC METABOLIC PANEL
BUN: 25 mg/dL — ABNORMAL HIGH (ref 6–23)
CO2: 29 mEq/L (ref 19–32)
Calcium: 8 mg/dL — ABNORMAL LOW (ref 8.4–10.5)
Chloride: 96 mEq/L (ref 96–112)
Creatinine, Ser: 1.05 mg/dL (ref 0.50–1.10)
GFR calc non Af Amer: 51 mL/min — ABNORMAL LOW (ref >90)
GFR, EST AFRICAN AMERICAN: 60 mL/min — AB (ref >90)
GLUCOSE: 121 mg/dL — AB (ref 70–99)
POTASSIUM: 4.3 meq/L (ref 3.7–5.3)
SODIUM: 134 meq/L — AB (ref 137–147)

## 2013-09-20 LAB — CBC
HEMATOCRIT: 31.2 % — AB (ref 36.0–46.0)
HEMOGLOBIN: 9.9 g/dL — AB (ref 12.0–15.0)
MCH: 28.9 pg (ref 26.0–34.0)
MCHC: 31.7 g/dL (ref 30.0–36.0)
MCV: 91.2 fL (ref 78.0–100.0)
Platelets: 218 10*3/uL (ref 150–400)
RBC: 3.42 MIL/uL — ABNORMAL LOW (ref 3.87–5.11)
RDW: 15.7 % — ABNORMAL HIGH (ref 11.5–15.5)
WBC: 9.4 10*3/uL (ref 4.0–10.5)

## 2013-09-20 LAB — PRO B NATRIURETIC PEPTIDE: Pro B Natriuretic peptide (BNP): 1310 pg/mL — ABNORMAL HIGH (ref 0–125)

## 2013-09-20 MED ORDER — FUROSEMIDE 40 MG PO TABS
40.0000 mg | ORAL_TABLET | Freq: Two times a day (BID) | ORAL | Status: DC
  Administered 2013-09-20: 40 mg via ORAL
  Filled 2013-09-20 (×4): qty 1

## 2013-09-20 MED ORDER — ENOXAPARIN SODIUM 40 MG/0.4ML ~~LOC~~ SOLN
40.0000 mg | SUBCUTANEOUS | Status: AC
  Administered 2013-09-20: 40 mg via SUBCUTANEOUS
  Filled 2013-09-20: qty 0.4

## 2013-09-20 NOTE — Progress Notes (Signed)
CSW continuing to follow for pt disposition needs.  Per chart, pt is planned for surgery tomorrow.   Pt and pt husband have chosen [facility] and [facility] for post acute placement and CSW updated [facility] [facility].  CSW to continue to follow and assist with pt discharge needs when pt medically ready for discharge.  Drake Leach, MSW, [place] [facility] [phone]

## 2013-09-20 NOTE — Progress Notes (Signed)
Echocardiogram 2D Echocardiogram has been performed.  Amy Burns 09/20/2013, 9:15 AM

## 2013-09-20 NOTE — Progress Notes (Signed)
[facility] PROGRESS NOTE  Amy Burns YBO:[id number] DOB: 09/06/1941 DOA: 09/26/13 PCP: Eulas Post, MD  Brief narrative: 73 year old female with history of bilateral knee replacement by Dr. Gladstone Lighter several years ago now presents to [facility] ED Sep 26, 2013 with main concern of one week duration of progressively worsening right knee pain, throbbing and constant, radiating to the right ankle, 10/10 in severity, associated with erythema and swelling, worse with ambulation and somewhat improved with rest, otherwise with no specific alleviating factors, no similar events in the past. Pt denied fevers, chills, numbness or tingling or specific trauma to the area. In ED, pt was in mild distress due to pain, orthopedic team called for further input on management.   Assessment and Plan:   Principal Problem:  MRSA R knee septic arthritis - status post right knee joint aspiration 26-Sep-2013  - joint fluid positive for MRSA; will need long term antibiotics; PICC line placed - continue vancomycin, day #8 - pain management with dilaudid 1 mg every 2 hours IV PRN severe pain and norco PO PRN moderate pain - Ortho following, plans to take to OR for resection arthroplasty tomorrow -Dr.Myers note indicates that pt expressed desire to hold off on any procedures as long as possible, agreeable for OR at this time -Cardiopulm status improved for OR tomorrow -Please be cautious and avoid/limit fluid administration post op  Leukocytosis  - most likely secondary to septic joint effusion - now resolved - continue vancomycin as noted above  Acute CHF -likely diastolic -improved now, change lasix to PO -was being diuresed with IV lasix -cath 2009 normal -ECHO done results pending  Acute renal failure  - likely pre renal in etiology and secondary to acute illness  - Cr trending down and WNL   Anemia of chronic disease  - Hg stable and with no signs of acute bleeding   Diabetes mellitus  - continue  to hold Metformin due to ARF  - pt on SSI for now  - A1C 6.5 (09-26-12)   Hpothyroidism  - continue synthroid   HTN  - continue to hold Lisinopril due to renal insufficiency (even though Cr improved at this time) and pending OR -continue hydralazine PRN for better BP control  Consultants:  Ortho  Procedures/Studies:  Dg Knee 1-2 Views Left 2013/09/26 Expected appearance after bilateral knee arthroplasties. No acute hardware complication. Limited evaluation for joint effusion, especially on the right.  Antibiotics:  Vancomycin September 26, 2022 -->  Maxipime September 26, 2022 --> 01/11  Code Status: Full  DVT prophylaxis: Lovenox on hold for OR Family Communication: Pt and husband at bedside  Disposition Plan: SNF recommended, SW assisting D/C plan  HPI/Subjective: No events overnight, breathing ok, some sore thorat earlier   Objective: Filed Vitals:   09/19/13 1650 09/19/13 2130 09/20/13 0548 09/20/13 0557  BP: 131/49 126/58 129/52 157/52  Pulse:  98 88   Temp:  98.4 F (36.9 C) 98.6 F (37 C)   TempSrc:  Oral Oral   Resp:  20    Height:      Weight:   133.811 kg (295 lb)   SpO2:  99% 95%     Intake/Output Summary (Last 24 hours) at 09/20/13 1112 Last data filed at 09/20/13 0958  Gross per 24 hour  Intake   1140 ml  Output   1525 ml  Net   -385 ml   Exam:  General: Pt is alert, follows commands appropriately, not in acute distress  Cardiovascular: Regular rate and rhythm, S1/S2, no murmurs,  no rubs, no gallops  Respiratory: Clear to auscultation bilaterally, no wheezing,  Abdomen: Soft, non tender, non distended, bowel sounds present, no guarding  Extremities: R knee with erythema, swelling, 1plus LE pitting edema, pulses DP and PT palpable bilaterally  Neuro: Grossly nonfocal   Data Reviewed: Basic Metabolic Panel:  Recent Labs Lab 09/16/13 0400 09/17/13 0404 09/18/13 0410 09/19/13 0545 09/20/13 0550  NA 136* 137 138 139 134*  K 4.3 4.5 4.4 4.5 4.3  CL 100 99 100 100 96   CO2 26 26 29 30 29   GLUCOSE 131* 124* 128* 119* 121*  BUN 31* 24* 24* 24* 25*  CREATININE 0.95 0.81 0.83 0.95 1.05  CALCIUM 8.0* 8.1* 8.3* 8.0* 8.0*   CBC:  Recent Labs Lab 09/13/13 1230  09/16/13 0400 09/17/13 0404 09/18/13 0410 09/19/13 0545 09/20/13 0610  WBC 14.6*  < > 10.0 10.8* 10.3 10.2 9.4  NEUTROABS 12.4*  --   --   --   --   --   --   HGB 10.2*  < > 9.7* 10.1* 10.6* 9.7* 9.9*  HCT 30.1*  < > 29.5* 30.7* 32.5* 30.3* 31.2*  MCV 89.9  < > 90.2 91.6 91.3 91.3 91.2  PLT 207  < > 266 271 253 237 218  < > = values in this interval not displayed.  CBG:  Recent Labs Lab 09/19/13 0717 09/19/13 1057 09/19/13 1655 09/19/13 2137 09/20/13 0732  GLUCAP 106* 124* 95 133* 111*    Recent Results (from the past 240 hour(s))  ANAEROBIC CULTURE     Status: None   Collection Time    09/13/13  8:00 PM      Result Value Range Status   Specimen Description KNEE RIGHT KNEE JOINT   Final   Special Requests ANAC ONLY   Final   Gram Stain     Final   Value: ABUNDANT WBC PRESENT,BOTH PMN AND MONONUCLEAR     NO SQUAMOUS EPITHELIAL CELLS SEEN     MODERATE GRAM POSITIVE COCCI     IN PAIRS IN CLUSTERS     Performed at [facility]   Culture     Final   Value: NO ANAEROBES ISOLATED     Performed at [facility]   Report Status 09/19/2013 FINAL   Final  BODY FLUID CULTURE     Status: None   Collection Time    09/14/13  4:27 PM      Result Value Range Status   Specimen Description KNEE   Final   Special Requests NONE   Final   Gram Stain     Final   Value: ABUNDANT WBC PRESENT,BOTH PMN AND MONONUCLEAR     MODERATE GRAM POSITIVE COCCI     IN PAIRS IN CLUSTERS Gram Stain Report Called to,Read Back By and Verified With: Gram Stain Report Called to,Read Back By and Verified With: DONNA A @1131  ON 09/15/13 BY SMIAS     Performed at [facility]   Culture     Final   Value: ABUNDANT METHICILLIN RESISTANT STAPHYLOCOCCUS AUREUS     Note: RIFAMPIN AND GENTAMICIN  SHOULD NOT BE USED AS SINGLE DRUGS FOR TREATMENT OF STAPH INFECTIONS. CRITICAL RESULT CALLED TO, READ BACK BY AND VERIFIED WITH: CAITLYN TAYLOR 09/16/13 @ 9:12AM BY RUSCOE A.     Performed at [facility]   Report Status 09/16/2013 FINAL   Final   Organism ID, Bacteria METHICILLIN RESISTANT STAPHYLOCOCCUS AUREUS   Final  SURGICAL PCR SCREEN  Status: Abnormal   Collection Time    09/16/13  4:47 PM      Result Value Range Status   MRSA, PCR POSITIVE (*) NEGATIVE Final   Comment: RESULT CALLED TO, READ BACK BY AND VERIFIED WITH:     K.BUTCHEE AT [id number] ON 11JAN15 BY C.BONGEL   Staphylococcus aureus POSITIVE (*) NEGATIVE Final  MRSA PCR SCREENING     Status: Abnormal   Collection Time    09/17/13  7:33 AM      Result Value Range Status   MRSA by PCR POSITIVE (*) NEGATIVE Final     Scheduled Meds: . aspirin EC  81 mg Oral Daily  . Chlorhexidine Gluconate Cloth  6 each Topical Q0600  . enoxaparin (LOVENOX) injection  40 mg Subcutaneous Q24H  . furosemide  40 mg Oral BID  . hydrALAZINE  10 mg Oral Q8H  . insulin aspart  0-9 Units Subcutaneous TID WC  . levothyroxine  125 mcg Oral QAC breakfast  . mupirocin ointment  1 application Nasal BID  . polyethylene glycol  17 g Oral Daily  . sodium chloride  10 mL Intravenous Q12H  . vancomycin  1,000 mg Intravenous Q12H   Continuous Infusions:    Domenic Polite, MD  [facility] Pager [phone]  If 7PM-7AM, please contact night-coverage [email or web address] Password [email or web address] 09/20/2013, 11:12 AM   LOS: 7 days

## 2013-09-20 NOTE — Progress Notes (Signed)
PT Cancellation Note  Patient Details Name: Amy Burns MRN: [id number] DOB: [birth year]   Cancelled Treatment:    Reason Eval/Treat Not Completed:  (pt is scheduled for surgery friday. will await reorder post op.)   Claretha Cooper 09/20/2013, 8:03 AM Tresa Endo PT [phone]

## 2013-09-20 NOTE — Progress Notes (Signed)
Patient ID: Amy Burns, female   DOB: 23-Nov-1940, 73 y.o.   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

## 2013-09-21 ENCOUNTER — Encounter (HOSPITAL_COMMUNITY): Payer: Medicare Other | Admitting: Anesthesiology

## 2013-09-21 ENCOUNTER — Inpatient Hospital Stay (HOSPITAL_COMMUNITY): Payer: Medicare Other | Admitting: Anesthesiology

## 2013-09-21 ENCOUNTER — Encounter (HOSPITAL_COMMUNITY)
Admission: EM | Disposition: A | Discharge status: Skilled Nursing Facility | Payer: Self-pay | Source: Home / Self Care | Attending: Internal Medicine

## 2013-09-21 ENCOUNTER — Encounter (HOSPITAL_COMMUNITY): Payer: Self-pay | Admitting: Anesthesiology

## 2013-09-21 HISTORY — PX: EXCISIONAL TOTAL KNEE ARTHROPLASTY WITH ANTIBIOTIC SPACERS: SHX5827

## 2013-09-21 LAB — GLUCOSE, CAPILLARY
GLUCOSE-CAPILLARY: 118 mg/dL — AB (ref 70–99)
GLUCOSE-CAPILLARY: 145 mg/dL — AB (ref 70–99)
GLUCOSE-CAPILLARY: 109 mg/dL — AB (ref 70–99)
GLUCOSE-CAPILLARY: 120 mg/dL — AB (ref 70–99)
Glucose-Capillary: 134 mg/dL — ABNORMAL HIGH (ref 70–99)

## 2013-09-21 LAB — BASIC METABOLIC PANEL
BUN: 31 mg/dL — AB (ref 6–23)
CO2: 29 meq/L (ref 19–32)
CREATININE: 1.37 mg/dL — AB (ref 0.50–1.10)
Calcium: 7.6 mg/dL — ABNORMAL LOW (ref 8.4–10.5)
Chloride: 98 mEq/L (ref 96–112)
GFR calc Af Amer: 43 mL/min — ABNORMAL LOW (ref >90)
GFR calc non Af Amer: 37 mL/min — ABNORMAL LOW (ref >90)
Glucose, Bld: 120 mg/dL — ABNORMAL HIGH (ref 70–99)
Potassium: 4.6 mEq/L (ref 3.7–5.3)
Sodium: 137 mEq/L (ref 137–147)

## 2013-09-21 LAB — CBC
HCT: 30.8 % — ABNORMAL LOW (ref 36.0–46.0)
Hemoglobin: 10.1 g/dL — ABNORMAL LOW (ref 12.0–15.0)
MCH: 29.7 pg (ref 26.0–34.0)
MCHC: 32.8 g/dL (ref 30.0–36.0)
MCV: 90.6 fL (ref 78.0–100.0)
Platelets: 228 10*3/uL (ref 150–400)
RBC: 3.4 MIL/uL — AB (ref 3.87–5.11)
RDW: 15.7 % — ABNORMAL HIGH (ref 11.5–15.5)
WBC: 8.8 10*3/uL (ref 4.0–10.5)

## 2013-09-21 LAB — TYPE AND SCREEN
ABO/RH(D): A POS
ANTIBODY SCREEN: NEGATIVE

## 2013-09-21 LAB — VANCOMYCIN, TROUGH: VANCOMYCIN TR: 37.3 ug/mL — AB (ref 10.0–20.0)

## 2013-09-21 SURGERY — REMOVAL, TOTAL ARTHROPLASTY HARDWARE, KNEE, WITH ANTIBIOTIC SPACER INSERTION
Anesthesia: General | Site: Knee | Laterality: Right

## 2013-09-21 MED ORDER — TOBRAMYCIN SULFATE 1.2 G IJ SOLR
INTRAMUSCULAR | Status: AC
  Filled 2013-09-21: qty 3.6

## 2013-09-21 MED ORDER — METOCLOPRAMIDE HCL 10 MG PO TABS: 5.0000 mg | ORAL_TABLET | Freq: Three times a day (TID) | ORAL | Status: DC | PRN

## 2013-09-21 MED ORDER — SODIUM CHLORIDE 0.9 % IJ SOLN
INTRAMUSCULAR | Status: AC
  Filled 2013-09-21: qty 250

## 2013-09-21 MED ORDER — SODIUM CHLORIDE 0.9 % IR SOLN
Status: DC | PRN
  Administered 2013-09-21: 2

## 2013-09-21 MED ORDER — SUCCINYLCHOLINE CHLORIDE 20 MG/ML IJ SOLN
INTRAMUSCULAR | Status: DC | PRN
  Administered 2013-09-21: 100 mg via INTRAVENOUS

## 2013-09-21 MED ORDER — FENTANYL CITRATE 0.05 MG/ML IJ SOLN
INTRAMUSCULAR | Status: AC
  Filled 2013-09-21: qty 5

## 2013-09-21 MED ORDER — SODIUM CHLORIDE 0.9 % IV SOLN
INTRAVENOUS | Status: DC
  Administered 2013-09-21 – 2013-09-22 (×2): via INTRAVENOUS
  Filled 2013-09-21 (×6): qty 1000

## 2013-09-21 MED ORDER — FERROUS SULFATE 325 (65 FE) MG PO TABS
325.0000 mg | ORAL_TABLET | Freq: Three times a day (TID) | ORAL | Status: DC
  Administered 2013-09-21 – 2013-09-26 (×15): 325 mg via ORAL
  Filled 2013-09-21 (×18): qty 1

## 2013-09-21 MED ORDER — LACTATED RINGERS IV SOLN
INTRAVENOUS | Status: DC | PRN
  Administered 2013-09-21 (×2): via INTRAVENOUS

## 2013-09-21 MED ORDER — CISATRACURIUM BESYLATE 20 MG/10ML IV SOLN
INTRAVENOUS | Status: AC
  Filled 2013-09-21: qty 10

## 2013-09-21 MED ORDER — EPHEDRINE SULFATE 50 MG/ML IJ SOLN
INTRAMUSCULAR | Status: AC
  Filled 2013-09-21: qty 1

## 2013-09-21 MED ORDER — PROPOFOL 10 MG/ML IV BOLUS
INTRAVENOUS | Status: DC | PRN
  Administered 2013-09-21: 150 mg via INTRAVENOUS

## 2013-09-21 MED ORDER — PROMETHAZINE HCL 25 MG/ML IJ SOLN: 6.2500 mg | INTRAMUSCULAR | Status: DC | PRN

## 2013-09-21 MED ORDER — ONDANSETRON HCL 4 MG/2ML IJ SOLN
INTRAMUSCULAR | Status: DC | PRN
  Administered 2013-09-21: 4 mg via INTRAVENOUS

## 2013-09-21 MED ORDER — NEOSTIGMINE METHYLSULFATE 1 MG/ML IJ SOLN
INTRAMUSCULAR | Status: DC | PRN
  Administered 2013-09-21: 3 mg via INTRAVENOUS

## 2013-09-21 MED ORDER — SODIUM CHLORIDE 0.9 % IJ SOLN
INTRAMUSCULAR | Status: DC | PRN
  Administered 2013-09-21: 09:00:00

## 2013-09-21 MED ORDER — BUPIVACAINE-EPINEPHRINE PF 0.25-1:200000 % IJ SOLN
INTRAMUSCULAR | Status: AC
Start: 2013-09-21 — End: 2013-09-21
  Filled 2013-09-21: qty 30

## 2013-09-21 MED ORDER — MENTHOL 3 MG MT LOZG
1.0000 | LOZENGE | OROMUCOSAL | Status: DC | PRN
  Filled 2013-09-21: qty 9

## 2013-09-21 MED ORDER — BUPIVACAINE-EPINEPHRINE PF 0.25-1:200000 % IJ SOLN
INTRAMUSCULAR | Status: DC | PRN
  Administered 2013-09-21: 20 mL

## 2013-09-21 MED ORDER — LIDOCAINE HCL (CARDIAC) 20 MG/ML IV SOLN
INTRAVENOUS | Status: DC | PRN
  Administered 2013-09-21: 100 mg via INTRAVENOUS

## 2013-09-21 MED ORDER — KETOROLAC TROMETHAMINE 30 MG/ML IJ SOLN
INTRAMUSCULAR | Status: AC
  Filled 2013-09-21: qty 1

## 2013-09-21 MED ORDER — MEPERIDINE HCL 50 MG/ML IJ SOLN: 6.2500 mg | INTRAMUSCULAR | Status: DC | PRN

## 2013-09-21 MED ORDER — SODIUM CHLORIDE 0.9 % IJ SOLN
INTRAMUSCULAR | Status: AC
  Filled 2013-09-21: qty 10

## 2013-09-21 MED ORDER — TOBRAMYCIN SULFATE 1.2 G IJ SOLR
INTRAMUSCULAR | Status: AC
  Filled 2013-09-21: qty 1.2

## 2013-09-21 MED ORDER — METOCLOPRAMIDE HCL 5 MG/ML IJ SOLN: 5.0000 mg | Freq: Three times a day (TID) | INTRAMUSCULAR | Status: DC | PRN

## 2013-09-21 MED ORDER — KETOROLAC TROMETHAMINE 30 MG/ML IJ SOLN
INTRAMUSCULAR | Status: DC | PRN
  Administered 2013-09-21: 30 mg via INTRAMUSCULAR

## 2013-09-21 MED ORDER — LIDOCAINE HCL (CARDIAC) 20 MG/ML IV SOLN
INTRAVENOUS | Status: AC
  Filled 2013-09-21: qty 5

## 2013-09-21 MED ORDER — EPHEDRINE SULFATE 50 MG/ML IJ SOLN
INTRAMUSCULAR | Status: DC | PRN
  Administered 2013-09-21 (×2): 10 mg via INTRAVENOUS

## 2013-09-21 MED ORDER — TOBRAMYCIN SULFATE 1.2 G IJ SOLR
INTRAMUSCULAR | Status: DC | PRN
  Administered 2013-09-21: 5

## 2013-09-21 MED ORDER — VANCOMYCIN HCL 1000 MG IV SOLR
INTRAVENOUS | Status: AC
  Filled 2013-09-21: qty 4000

## 2013-09-21 MED ORDER — HEPARIN SOD (PORK) LOCK FLUSH 100 UNIT/ML IV SOLN
INTRAVENOUS | Status: AC
  Filled 2013-09-21: qty 5

## 2013-09-21 MED ORDER — PHENOL 1.4 % MT LIQD
1.0000 | OROMUCOSAL | Status: DC | PRN
Start: 2013-09-21 — End: 2013-09-26
  Filled 2013-09-21: qty 177

## 2013-09-21 MED ORDER — HYDROMORPHONE HCL PF 2 MG/ML IJ SOLN
INTRAMUSCULAR | Status: AC
  Filled 2013-09-21: qty 1

## 2013-09-21 MED ORDER — FENTANYL CITRATE 0.05 MG/ML IJ SOLN
INTRAMUSCULAR | Status: DC | PRN
  Administered 2013-09-21 (×5): 50 ug via INTRAVENOUS

## 2013-09-21 MED ORDER — VANCOMYCIN HCL 1000 MG IV SOLR
INTRAVENOUS | Status: AC
  Filled 2013-09-21: qty 1000

## 2013-09-21 MED ORDER — ONDANSETRON HCL 4 MG/2ML IJ SOLN
INTRAMUSCULAR | Status: AC
  Filled 2013-09-21: qty 2

## 2013-09-21 MED ORDER — PHENYLEPHRINE 40 MCG/ML (10ML) SYRINGE FOR IV PUSH (FOR BLOOD PRESSURE SUPPORT)
PREFILLED_SYRINGE | INTRAVENOUS | Status: AC
  Filled 2013-09-21: qty 10

## 2013-09-21 MED ORDER — BUPIVACAINE LIPOSOME 1.3 % IJ SUSP
20.0000 mL | Freq: Once | INTRAMUSCULAR | Status: DC
  Filled 2013-09-21: qty 20

## 2013-09-21 MED ORDER — ASPIRIN EC 325 MG PO TBEC
325.0000 mg | DELAYED_RELEASE_TABLET | Freq: Two times a day (BID) | ORAL | Status: DC
  Administered 2013-09-22 – 2013-09-26 (×9): 325 mg via ORAL
  Filled 2013-09-21 (×11): qty 1

## 2013-09-21 MED ORDER — TOBRAMYCIN SULFATE 1.2 G IJ SOLR
1.2000 g | INTRAMUSCULAR | Status: AC
  Filled 2013-09-21: qty 1.2

## 2013-09-21 MED ORDER — CISATRACURIUM BESYLATE (PF) 10 MG/5ML IV SOLN
INTRAVENOUS | Status: DC | PRN
  Administered 2013-09-21: 6 mg via INTRAVENOUS

## 2013-09-21 MED ORDER — PROPOFOL 10 MG/ML IV BOLUS
INTRAVENOUS | Status: AC
  Filled 2013-09-21: qty 20

## 2013-09-21 MED ORDER — FENTANYL CITRATE 0.05 MG/ML IJ SOLN
25.0000 ug | INTRAMUSCULAR | Status: DC | PRN
  Administered 2013-09-21: 50 ug via INTRAVENOUS

## 2013-09-21 MED ORDER — KETAMINE HCL 10 MG/ML IJ SOLN
INTRAMUSCULAR | Status: AC
  Filled 2013-09-21: qty 1

## 2013-09-21 MED ORDER — FENTANYL CITRATE 0.05 MG/ML IJ SOLN
INTRAMUSCULAR | Status: AC
  Filled 2013-09-21: qty 2

## 2013-09-21 MED ORDER — GLYCOPYRROLATE 0.2 MG/ML IJ SOLN
INTRAMUSCULAR | Status: DC | PRN
  Administered 2013-09-21 (×3): 0.4 mg via INTRAVENOUS
  Administered 2013-09-21: .4 mg via INTRAVENOUS

## 2013-09-21 MED ORDER — FUROSEMIDE 40 MG PO TABS: 40.0000 mg | ORAL_TABLET | Freq: Every day | ORAL | Status: DC

## 2013-09-21 MED ORDER — HYDROMORPHONE HCL PF 1 MG/ML IJ SOLN
INTRAMUSCULAR | Status: DC | PRN
  Administered 2013-09-21: .4 mg via INTRAVENOUS

## 2013-09-21 MED ORDER — VANCOMYCIN HCL 1000 MG IV SOLR
INTRAVENOUS | Status: DC | PRN
  Administered 2013-09-21: 5000 mg

## 2013-09-21 MED ORDER — KETAMINE HCL 10 MG/ML IJ SOLN
INTRAMUSCULAR | Status: DC | PRN
  Administered 2013-09-21: 10 mg via INTRAVENOUS
  Administered 2013-09-21: 30 mg via INTRAVENOUS
  Administered 2013-09-21: 10 mg via INTRAVENOUS

## 2013-09-21 MED ORDER — PHENYLEPHRINE HCL 10 MG/ML IJ SOLN
INTRAMUSCULAR | Status: DC | PRN
  Administered 2013-09-21: 80 ug via INTRAVENOUS

## 2013-09-21 SURGICAL SUPPLY — 66 items
BAG ZIPLOCK 12X15 (MISCELLANEOUS) ×3 IMPLANT
BANDAGE ELASTIC 6 VELCRO ST LF (GAUZE/BANDAGES/DRESSINGS) ×3 IMPLANT
BANDAGE ESMARK 6X9 LF (GAUZE/BANDAGES/DRESSINGS) ×1 IMPLANT
BLADE SAW SGTL 13.0X1.19X90.0M (BLADE) ×3 IMPLANT
BLADE SAW SGTL 81X20 HD (BLADE) ×3 IMPLANT
BNDG ESMARK 6X9 LF (GAUZE/BANDAGES/DRESSINGS) ×3
BOWL SMART MIX CTS (DISPOSABLE) IMPLANT
CEMENT HV SMART SET (Cement) ×15 IMPLANT
CUFF TOURN SGL QUICK 34 (TOURNIQUET CUFF) ×2
CUFF TRNQT CYL 34X4X40X1 (TOURNIQUET CUFF) ×1 IMPLANT
DERMABOND ADVANCED (GAUZE/BANDAGES/DRESSINGS) ×2
DERMABOND ADVANCED .7 DNX12 (GAUZE/BANDAGES/DRESSINGS) ×1 IMPLANT
DRAPE EXTREMITY T 121X128X90 (DRAPE) ×3 IMPLANT
DRAPE POUCH INSTRU U-SHP 10X18 (DRAPES) ×3 IMPLANT
DRAPE U-SHAPE 47X51 STRL (DRAPES) ×3 IMPLANT
DRSG ADAPTIC 3X8 NADH LF (GAUZE/BANDAGES/DRESSINGS) ×3 IMPLANT
DRSG AQUACEL AG ADV 3.5X10 (GAUZE/BANDAGES/DRESSINGS) ×3 IMPLANT
DRSG PAD ABDOMINAL 8X10 ST (GAUZE/BANDAGES/DRESSINGS) ×3 IMPLANT
DRSG TEGADERM 4X4.75 (GAUZE/BANDAGES/DRESSINGS) ×3 IMPLANT
DURAPREP 26ML APPLICATOR (WOUND CARE) ×3 IMPLANT
ELECT REM PT RETURN 9FT ADLT (ELECTROSURGICAL) ×3
ELECTRODE REM PT RTRN 9FT ADLT (ELECTROSURGICAL) ×1 IMPLANT
EVACUATOR 1/8 PVC DRAIN (DRAIN) ×3 IMPLANT
FACESHIELD LNG OPTICON STERILE (SAFETY) ×15 IMPLANT
GAUZE SPONGE 2X2 8PLY STRL LF (GAUZE/BANDAGES/DRESSINGS) ×1 IMPLANT
GAUZE XEROFORM 5X9 LF (GAUZE/BANDAGES/DRESSINGS) ×3 IMPLANT
GLOVE BIOGEL PI IND STRL 7.5 (GLOVE) ×1 IMPLANT
GLOVE BIOGEL PI IND STRL 8 (GLOVE) ×2 IMPLANT
GLOVE BIOGEL PI INDICATOR 7.5 (GLOVE) ×2
GLOVE BIOGEL PI INDICATOR 8 (GLOVE) ×4
GLOVE ORTHO TXT STRL SZ7.5 (GLOVE) ×6 IMPLANT
GLOVE SURG ORTHO 8.0 STRL STRW (GLOVE) ×3 IMPLANT
GOWN SPEC L3 XXLG W/TWL (GOWN DISPOSABLE) ×6 IMPLANT
GOWN STRL REUS W/TWL LRG LVL3 (GOWN DISPOSABLE) ×3 IMPLANT
HANDPIECE INTERPULSE COAX TIP (DISPOSABLE) ×2
IMMOBILIZER KNEE 20 (SOFTGOODS) IMPLANT
KIT BASIN OR (CUSTOM PROCEDURE TRAY) ×3 IMPLANT
MANIFOLD NEPTUNE II (INSTRUMENTS) ×3 IMPLANT
MOLD CEMENT FEMORAL 65MM ×3 IMPLANT
MOLD CEMENT TIBIAL 70MM (Cement) ×3 IMPLANT
NDL SAFETY ECLIPSE 18X1.5 (NEEDLE) ×1 IMPLANT
NEEDLE HYPO 18GX1.5 SHARP (NEEDLE) ×2
NS IRRIG 1000ML POUR BTL (IV SOLUTION) ×3 IMPLANT
PACK TOTAL JOINT (CUSTOM PROCEDURE TRAY) ×3 IMPLANT
PAD ABD 8X10 STRL (GAUZE/BANDAGES/DRESSINGS) ×3 IMPLANT
PADDING CAST ABS 6INX4YD NS (CAST SUPPLIES) ×2
PADDING CAST ABS COTTON 6X4 NS (CAST SUPPLIES) ×1 IMPLANT
PADDING CAST COTTON 6X4 STRL (CAST SUPPLIES) ×6 IMPLANT
POSITIONER SURGICAL ARM (MISCELLANEOUS) ×3 IMPLANT
SET HNDPC FAN SPRY TIP SCT (DISPOSABLE) ×1 IMPLANT
SET PAD KNEE POSITIONER (MISCELLANEOUS) ×3 IMPLANT
SPONGE GAUZE 2X2 STER 10/PKG (GAUZE/BANDAGES/DRESSINGS) ×2
SPONGE GAUZE 4X4 12PLY (GAUZE/BANDAGES/DRESSINGS) ×3 IMPLANT
SPONGE LAP 18X18 X RAY DECT (DISPOSABLE) ×6 IMPLANT
STAPLER VISISTAT 35W (STAPLE) ×6 IMPLANT
SUCTION FRAZIER 12FR DISP (SUCTIONS) ×3 IMPLANT
SUT PDS AB 1 CT1 27 (SUTURE) IMPLANT
SUT VIC AB 1 CT1 36 (SUTURE) ×9 IMPLANT
SUT VIC AB 2-0 CT1 27 (SUTURE) ×6
SUT VIC AB 2-0 CT1 TAPERPNT 27 (SUTURE) ×3 IMPLANT
SYR 50ML LL SCALE MARK (SYRINGE) ×3 IMPLANT
TOWEL OR 17X26 10 PK STRL BLUE (TOWEL DISPOSABLE) ×6 IMPLANT
TOWER CARTRIDGE SMART MIX (DISPOSABLE) ×3 IMPLANT
TRAY FOLEY CATH 14FRSI W/METER (CATHETERS) ×3 IMPLANT
WATER STERILE IRR 1500ML POUR (IV SOLUTION) ×3 IMPLANT
WRAP KNEE MAXI GEL POST OP (GAUZE/BANDAGES/DRESSINGS) ×3 IMPLANT

## 2013-09-21 NOTE — Progress Notes (Signed)
Pt pre-op procedure checklist completed.  Pt picked up for surgery. Pt red bag with glasses, blistex and other personal items and partial was sent went pt husband.

## 2013-09-21 NOTE — Progress Notes (Signed)
Patient ID: Amy Burns, female   DOB: 1940-11-28, 73 y.o.   MRN: [id number]  To OR today for resection of infected right total knee Consent on chart  Lovenox stopped Vancomycin has been given pre-operatively

## 2013-09-21 NOTE — Anesthesia Preprocedure Evaluation (Addendum)
Anesthesia Evaluation  Patient identified by MRN, date of birth, ID band Patient awake    Reviewed: Allergy & Precautions, H&P , NPO status , Patient's Chart, lab work & pertinent test results  Airway Mallampati: II TM Distance: >3 FB Neck ROM: Full    Dental no notable dental hx. (+) Edentulous Upper and Edentulous Lower   Pulmonary shortness of breath, sleep apnea ,  breath sounds clear to auscultation  Pulmonary exam normal       Cardiovascular hypertension, Pt. on medications +CHF and + Orthopnea Rhythm:Regular Rate:Normal     Neuro/Psych negative neurological ROS  negative psych ROS   GI/Hepatic negative GI ROS, Neg liver ROS,   Endo/Other  diabetesHypothyroidism Morbid obesity  Renal/GU negative Renal ROS  negative genitourinary   Musculoskeletal negative musculoskeletal ROS (+)   Abdominal   Peds negative pediatric ROS (+)  Hematology negative hematology ROS (+)   Anesthesia Other Findings   Reproductive/Obstetrics negative OB ROS                        Anesthesia Physical Anesthesia Plan  ASA: IV  Anesthesia Plan: General   Post-op Pain Management:    Induction: Intravenous  Airway Management Planned: Oral ETT  Additional Equipment:   Intra-op Plan:   Post-operative Plan: Extubation in OR  Informed Consent: I have reviewed the patients History and Physical, chart, labs and discussed the procedure including the risks, benefits and alternatives for the proposed anesthesia with the patient or authorized representative who has indicated his/her understanding and acceptance.   Dental advisory given  Plan Discussed with: CRNA  Anesthesia Plan Comments: (Pt appears improved today. Less SOB. LE edema better. SAB would be very difficult secondary to body habitus. Will proceed with GA.)      Anesthesia Quick Evaluation

## 2013-09-21 NOTE — Progress Notes (Signed)
ANTIBIOTIC CONSULT NOTE - FOLLOW UP  Pharmacy Consult for Vancomycin Indication: septic arthritis/PJI   Recent Labs  09/19/13 0545 09/20/13 0550 09/20/13 0610 09/21/13 0520  WBC 10.2  --  9.4 8.8  HGB 9.7*  --  9.9* 10.1*  PLT 237  --  218 228  CREATININE 0.95 1.05  --  1.37*   Estimated Creatinine Clearance: 48.2 ml/min (by C-G formula based on Cr of 1.37).  Recent Labs  09/18/13 2009  VANCOTROUGH 22.6*   Anti-infectives: 1/8 >> Vanc >> 1/8 >> Cefepime >> 1/11  Assessment: 26 yoF with PMHx significant for BL knee replacements presents with progressively worsening R knee and ankle pain, concerning for septic arthritis or prosthetic joint infection. Pharmacy consulted to dose vancomycin.  Day #8  of vancomycin  Micro:  Joint aspirate: MRSA  Renal: SCr rising, now 1.37, CrCl 48  Vancomycin trough level: 22.6 on 1/13, slightly above goal range on vancomycin 1250mg  q12h and dose decreased to 1g q12  WBC WNL and afebrile   Goal of Therapy:  Vancomycin trough level 15-20 mcg/ml  Plan:  Due to rising SCr, will check another vancomycin trough prior to tonight's dose. Patient currently receiving vancomycin 1g q12   Adrian Saran, PharmD, BCPS Pager [phone] 09/21/2013 12:54 PM

## 2013-09-21 NOTE — Preoperative (Signed)
Beta Blockers   Reason not to administer Beta Blockers:Not Applicable 

## 2013-09-21 NOTE — Anesthesia Postprocedure Evaluation (Signed)
  Anesthesia Post-op Note  Patient: Amy Burns  Procedure(s) Performed: Procedure(s) (LRB): RIGHT TOTAL KNEE RESECTION WITH ANTIBIOTIC SPACER (Right)  Patient Location: PACU  Anesthesia Type: General  Level of Consciousness: awake and alert   Airway and Oxygen Therapy: Patient Spontanous Breathing  Post-op Pain: mild  Post-op Assessment: Post-op Vital signs reviewed, Patient's Cardiovascular Status Stable, Respiratory Function Stable, Patent Airway and No signs of Nausea or vomiting  Last Vitals:  Filed Vitals:   09/21/13 1129  BP: 98/59  Pulse: 91  Temp: 36.6 C  Resp: 16    Post-op Vital Signs: stable   Complications: No apparent anesthesia complications

## 2013-09-21 NOTE — Anesthesia Procedure Notes (Signed)
Procedure Name: Intubation Date/Time: 09/21/2013 8:00 AM Performed by: Danley Danker L Patient Re-evaluated:Patient Re-evaluated prior to inductionOxygen Delivery Method: Circle system utilized Preoxygenation: Pre-oxygenation with 100% oxygen Intubation Type: IV induction Ventilation: Mask ventilation without difficulty and Oral airway inserted - appropriate to patient size Laryngoscope Size: Sabra Heck and 2 Grade View: Grade I Tube type: Oral Tube size: 7.5 mm Number of attempts: 1 Airway Equipment and Method: Stylet Placement Confirmation: ETT inserted through vocal cords under direct vision,  breath sounds checked- equal and bilateral and positive ETCO2 Secured at: 16 cm Tube secured with: Tape Dental Injury: Teeth and Oropharynx as per pre-operative assessment

## 2013-09-21 NOTE — Transfer of Care (Signed)
Immediate Anesthesia Transfer of Care Note  Patient: Amy Burns  Procedure(s) Performed: Procedure(s): RIGHT TOTAL KNEE RESECTION WITH ANTIBIOTIC SPACER (Right)  Patient Location: PACU  Anesthesia Type:General  Level of Consciousness: awake  Airway & Oxygen Therapy: Patient Spontanous Breathing and Patient connected to face mask oxygen  Post-op Assessment: Report given to PACU RN and Post -op Vital signs reviewed and stable  Post vital signs: Reviewed and stable  Complications: No apparent anesthesia complications

## 2013-09-21 NOTE — Brief Op Note (Signed)
09/13/2013 - 09/21/2013  9:46 AM  PATIENT:  Amy Burns  73 y.o. female  PRE-OPERATIVE DIAGNOSIS:  infected right total knee  POST-OPERATIVE DIAGNOSIS:  infected right total knee  PROCEDURE:  Procedure(s): RIGHT TOTAL KNEE RESECTION WITH ANTIBIOTIC SPACER (Right)  SURGEON:  Surgeon(s) and Role:    * Mauri Pole, MD - Primary  PHYSICIAN ASSISTANT: Danae Orleans, PA-C  ANESTHESIA:   general  EBL:  Total I/O In: -  Out: 175 [Urine:125; Blood:50]  BLOOD ADMINISTERED:none  DRAINS: (1 medium) Hemovact drain(s) in the right knee with  Suction Open   LOCAL MEDICATIONS USED:  Exparel/Marcaine combination  SPECIMEN:  No Specimen  DISPOSITION OF SPECIMEN:  N/A  COUNTS:  YES  TOURNIQUET:   Total Tourniquet Time Documented: Thigh (Right) - 79 minutes Total: Thigh (Right) - 79 minutes   DICTATION: .Other Dictation: Dictation Number [id number]  PLAN OF CARE: Admit to inpatient   PATIENT DISPOSITION:  PACU - hemodynamically stable.   Delay start of Pharmacological VTE agent (>24hrs) due to surgical blood loss or risk of bleeding: no

## 2013-09-21 NOTE — Progress Notes (Signed)
[facility] PROGRESS NOTE  Amy Burns YTK:[id number] DOB: 1940-09-20 DOA: October 01, 2013 PCP: Eulas Post, MD  Brief narrative: 73 year old female with history of bilateral knee replacement by Dr. Gladstone Lighter several years ago now presents to [facility] ED 10/01/13 with main concern of one week duration of progressively worsening right knee pain, throbbing and constant, radiating to the right ankle, 10/10 in severity, associated with erythema and swelling, worse with ambulation and somewhat improved with rest, otherwise with no specific alleviating factors, no similar events in the past. Pt denied fevers, chills, numbness or tingling or specific trauma to the area. In ED, pt was in mild distress due to pain, orthopedic team called for further input on management.   Assessment and Plan:   Principal Problem:  MRSA R knee septic arthritis - status post right knee joint aspiration 10/01/13  - joint fluid positive for MRSA; will need long term antibiotics; PICC line placed - continue vancomycin, day #8 - pain management with dilaudid 1 mg every 2 hours IV PRN severe pain and norco PO PRN moderate pain - s/p OR today, Abx spacer placed  Leukocytosis  - most likely secondary to septic joint effusion - now resolved - continue vancomycin as noted above  Acute CHF -likely diastolic -improved now, hold lasix now -was being diuresed with IV lasix -cath 2009 normal -ECHO done normal EF -gentle IVf due to poor urine output for now  Acute renal failure  - likely pre renal in etiology and secondary to acute illness  - Cr trending up again, cut down lasix, caution with Vanc Dosing  Anemia of chronic disease  - Hg stable and with no signs of acute bleeding   Diabetes mellitus  - continue to hold Metformin due to ARF  - pt on SSI for now  - A1C 6.5 (2012-10-01)   Hpothyroidism  - continue synthroid   HTN  - continue to hold Lisinopril due to renal insufficiency (even though Cr improved at  this time) and pending OR -continue hydralazine PRN   Consultants:  Ortho  Procedures/Studies:  Dg Knee 1-2 Views Left 10/01/13 Expected appearance after bilateral knee arthroplasties. No acute hardware complication. Limited evaluation for joint effusion, especially on the right.  Antibiotics:  Vancomycin 2022-10-01 -->  Maxipime October 01, 2022 --> 01/11  Code Status: Full  DVT prophylaxis: Lovenox on hold for OR Family Communication: Pt and husband at bedside  Disposition Plan: SNF recommended, SW assisting D/C plan  HPI/Subjective: No events overnight, breathing ok, some sore thorat earlier   Objective: Filed Vitals:   09/21/13 1129 09/21/13 1257 09/21/13 1336 09/21/13 1432  BP: 98/59 114/68 124/72 116/69  Pulse: 91 95 88 86  Temp: 97.8 F (36.6 C) 98 F (36.7 C) 98.3 F (36.8 C) 97.9 F (36.6 C)  TempSrc:  Oral Oral Oral  Resp: 16 18 18 16   Height:      Weight:      SpO2: 95% 98% 99% 100%    Intake/Output Summary (Last 24 hours) at 09/21/13 1707 Last data filed at 09/21/13 1439  Gross per 24 hour  Intake   1545 ml  Output    715 ml  Net    830 ml   Exam:  General: Pt is alert, follows commands appropriately, not in acute distress  Cardiovascular: Regular rate and rhythm, S1/S2, no murmurs, no rubs, no gallops  Respiratory: Clear to auscultation bilaterally, no wheezing,  Abdomen: Soft, non tender, non distended, bowel sounds present, no guarding  Extremities: R knee  with erythema, swelling, 1plus LE pitting edema, pulses DP and PT palpable bilaterally  Neuro: Grossly nonfocal   Data Reviewed: Basic Metabolic Panel:  Recent Labs Lab 09/17/13 0404 09/18/13 0410 09/19/13 0545 09/20/13 0550 09/21/13 0520  NA 137 138 139 134* 137  K 4.5 4.4 4.5 4.3 4.6  CL 99 100 100 96 98  CO2 26 29 30 29 29   GLUCOSE 124* 128* 119* 121* 120*  BUN 24* 24* 24* 25* 31*  CREATININE 0.81 0.83 0.95 1.05 1.37*  CALCIUM 8.1* 8.3* 8.0* 8.0* 7.6*   CBC:  Recent Labs Lab 09/17/13 0404  09/18/13 0410 09/19/13 0545 09/20/13 0610 09/21/13 0520  WBC 10.8* 10.3 10.2 9.4 8.8  HGB 10.1* 10.6* 9.7* 9.9* 10.1*  HCT 30.7* 32.5* 30.3* 31.2* 30.8*  MCV 91.6 91.3 91.3 91.2 90.6  PLT 271 253 237 218 228    CBG:  Recent Labs Lab 09/20/13 2138 09/21/13 0531 09/21/13 1027 09/21/13 1258 09/21/13 1654  GLUCAP 165* 118* 134* 145* 109*    Recent Results (from the past 240 hour(s))  ANAEROBIC CULTURE     Status: None   Collection Time    09/13/13  8:00 PM      Result Value Range Status   Specimen Description KNEE RIGHT KNEE JOINT   Final   Special Requests ANAC ONLY   Final   Gram Stain     Final   Value: ABUNDANT WBC PRESENT,BOTH PMN AND MONONUCLEAR     NO SQUAMOUS EPITHELIAL CELLS SEEN     MODERATE GRAM POSITIVE COCCI     IN PAIRS IN CLUSTERS     Performed at [facility]   Culture     Final   Value: NO ANAEROBES ISOLATED     Performed at [facility]   Report Status 09/19/2013 FINAL   Final  BODY FLUID CULTURE     Status: None   Collection Time    09/14/13  4:27 PM      Result Value Range Status   Specimen Description KNEE   Final   Special Requests NONE   Final   Gram Stain     Final   Value: ABUNDANT WBC PRESENT,BOTH PMN AND MONONUCLEAR     MODERATE GRAM POSITIVE COCCI     IN PAIRS IN CLUSTERS Gram Stain Report Called to,Read Back By and Verified With: Gram Stain Report Called to,Read Back By and Verified With: DONNA A @1131  ON 09/15/13 BY SMIAS     Performed at [facility]   Culture     Final   Value: ABUNDANT METHICILLIN RESISTANT STAPHYLOCOCCUS AUREUS     Note: RIFAMPIN AND GENTAMICIN SHOULD NOT BE USED AS SINGLE DRUGS FOR TREATMENT OF STAPH INFECTIONS. CRITICAL RESULT CALLED TO, READ BACK BY AND VERIFIED WITH: CAITLYN TAYLOR 09/16/13 @ 9:12AM BY RUSCOE A.     Performed at [facility]   Report Status 09/16/2013 FINAL   Final   Organism ID, Bacteria METHICILLIN RESISTANT STAPHYLOCOCCUS AUREUS   Final  SURGICAL PCR SCREEN      Status: Abnormal   Collection Time    09/16/13  4:47 PM      Result Value Range Status   MRSA, PCR POSITIVE (*) NEGATIVE Final   Comment: RESULT CALLED TO, READ BACK BY AND VERIFIED WITH:     K.BUTCHEE AT 1818 ON [id number] BY C.BONGEL   Staphylococcus aureus POSITIVE (*) NEGATIVE Final  MRSA PCR SCREENING     Status: Abnormal   Collection Time  09/17/13  7:33 AM      Result Value Range Status   MRSA by PCR POSITIVE (*) NEGATIVE Final     Scheduled Meds: . [START ON 09/22/2013] aspirin EC  325 mg Oral BID  . fentaNYL      . ferrous sulfate  325 mg Oral TID PC  . insulin aspart  0-9 Units Subcutaneous TID WC  . levothyroxine  125 mcg Oral QAC breakfast  . mupirocin ointment  1 application Nasal BID  . polyethylene glycol  17 g Oral Daily  . sodium chloride  10 mL Intravenous Q12H  . tobramycin  1.2 g Topical To OR  . vancomycin  1,000 mg Intravenous Q12H   Continuous Infusions: . sodium chloride 0.9 % 1,000 mL with potassium chloride 10 mEq infusion 75 mL/hr at 09/21/13 1555     Lizvette Lightsey, MD  [facility] Pager [phone]  If 7PM-7AM, please contact night-coverage [email or web address] Password [email or web address] 09/21/2013, 5:07 PM   LOS: 8 days

## 2013-09-22 ENCOUNTER — Inpatient Hospital Stay (HOSPITAL_COMMUNITY): Payer: Medicare Other

## 2013-09-22 LAB — CBC
HCT: 28.8 % — ABNORMAL LOW (ref 36.0–46.0)
HEMOGLOBIN: 9.2 g/dL — AB (ref 12.0–15.0)
MCH: 29.7 pg (ref 26.0–34.0)
MCHC: 31.9 g/dL (ref 30.0–36.0)
MCV: 92.9 fL (ref 78.0–100.0)
PLATELETS: 237 10*3/uL (ref 150–400)
RBC: 3.1 MIL/uL — AB (ref 3.87–5.11)
RDW: 16.3 % — ABNORMAL HIGH (ref 11.5–15.5)
WBC: 9.8 10*3/uL (ref 4.0–10.5)

## 2013-09-22 LAB — URINE MICROSCOPIC-ADD ON

## 2013-09-22 LAB — URINALYSIS, ROUTINE W REFLEX MICROSCOPIC
BILIRUBIN URINE: NEGATIVE
Glucose, UA: NEGATIVE mg/dL
Ketones, ur: NEGATIVE mg/dL
Nitrite: NEGATIVE
Protein, ur: NEGATIVE mg/dL
Specific Gravity, Urine: 1.015 (ref 1.005–1.030)
UROBILINOGEN UA: 0.2 mg/dL (ref 0.0–1.0)
pH: 5 (ref 5.0–8.0)

## 2013-09-22 LAB — GLUCOSE, CAPILLARY
GLUCOSE-CAPILLARY: 108 mg/dL — AB (ref 70–99)
Glucose-Capillary: 147 mg/dL — ABNORMAL HIGH (ref 70–99)
Glucose-Capillary: 123 mg/dL — ABNORMAL HIGH (ref 70–99)

## 2013-09-22 LAB — BASIC METABOLIC PANEL
BUN: 38 mg/dL — ABNORMAL HIGH (ref 6–23)
CALCIUM: 7.3 mg/dL — AB (ref 8.4–10.5)
CHLORIDE: 95 meq/L — AB (ref 96–112)
CO2: 28 meq/L (ref 19–32)
Creatinine, Ser: 2.55 mg/dL — ABNORMAL HIGH (ref 0.50–1.10)
GFR calc Af Amer: 20 mL/min — ABNORMAL LOW (ref >90)
GFR calc non Af Amer: 18 mL/min — ABNORMAL LOW (ref >90)
Glucose, Bld: 100 mg/dL — ABNORMAL HIGH (ref 70–99)
POTASSIUM: 5.1 meq/L (ref 3.7–5.3)
SODIUM: 133 meq/L — AB (ref 137–147)

## 2013-09-22 LAB — GENTAMICIN LEVEL, RANDOM

## 2013-09-22 MED ORDER — SODIUM CHLORIDE 0.9 % IV SOLN
INTRAVENOUS | Status: DC
Start: 2013-09-22 — End: 2013-09-22

## 2013-09-22 MED ORDER — FUROSEMIDE 10 MG/ML IJ SOLN
80.0000 mg | Freq: Two times a day (BID) | INTRAMUSCULAR | Status: DC
  Administered 2013-09-22 – 2013-09-24 (×4): 80 mg via INTRAVENOUS
  Filled 2013-09-22 (×8): qty 8

## 2013-09-22 NOTE — Op Note (Signed)
NAMEORILLA, TEMPLEMAN NO.:  [id number]  MEDICAL RECORD NO.:  [id number]  LOCATION:  4782                         FACILITY:  [facility]  PHYSICIAN:  Pietro Cassis. Alvan Dame, M.D.  DATE OF BIRTH:  [birth year]  DATE OF PROCEDURE:  09/21/2013 DATE OF DISCHARGE:                              OPERATIVE REPORT   PREOPERATIVE DIAGNOSIS:  Infected right total knee arthroplasty.  POSTOPERATIVE DIAGNOSIS:  Infected right total knee arthroplasty.  PROCEDURE:  Resection of right total knee arthroplasty, placement of antibiotic spacer.  Spacer with a Biomet articulating spacer to about 75 femoral mold, size 70 tibial mold.  Cement used, 5 batch of the cement were used, each batch of the cement was combined with 1 g of vancomycin and 1.2 g of tobramycin.  SURGEON:  Pietro Cassis. Alvan Dame, M.D.  ASSISTANT:  Danae Orleans, PA-C.  Note that, Mr. Guinevere Scarlet was present for the entirety of the case from preoperative position, perioperative management, operative extremity, general facilitation of the case, primary wound closure.  ANESTHESIA:  General.  SPECIMENS:  None taken as the patient had previous knee aspiration with cultures revealing a positive MRSA.  DRAINS:  One medium Hemovac.  TOURNIQUET TIME:  78 minutes at 250 mmHg.  COMPLICATION:  None.  INDICATIONS FOR PROCEDURE:  Ms. Hannay is a 73 year old female with a history of a right total knee arthroplasty by 1 of my partners, who had subsequently retired.  She had some problems with wound and drainage in the postoperative period.  It was treated with local wound management and antibiotics.  She presented to the hospital recently with increasing pain and swelling.  She was seen on consultation by 1 of my partners, who following aspiration confirmed concern with infection with an elevated sedimentation rate of 126, elevated white cell count and aspiration of 119,000.  It was determined and reviewed with the patient. This was consistent  with a septic right knee arthroplasty and the components needed to be removed and managed in 2 stage reimplantation. Once cleared from a medical standpoint due to significant comorbidities of obesity, concerns for the heart failure and lower extremity edematous changes, she was scheduled for resection.  Risks and benefits were discussed.  PROCEDURE IN DETAIL:  The patient was brought to the operative theater. Once adequate anesthesia, preoperative antibiotics, vancomycin which had been administered since she is in the hospitalization followed by pharmacy.  She was positioned supine with the right thigh tourniquet placed.  The right lower extremity was then prepped and draped in the sterile fashion with the right foot placed in [facility] leg holder.  A time-out was performed identifying the patient, planned procedure, and extremity.  The leg was exsanguinated, tourniquet elevated to 250 mmHg. I basically utilized the patient's old incision excising a portion of it to try to straighten out the incision, but removed some scar tissue. Soft tissue planes were created.  Median arthrotomy was made encountering significant purulent material still obtained in the joint. Following initial exposure and synovectomy medial, lateral and suprapatellar region, attention was directed removing the components. The tibial and femoral components removed in a standard technique from me utilizing a combination of ACL thin, ACL saw, osteotomes.  The components removed without significant bone loss.  Cement was removed. Once the femoral and tibial close removing all cement debrided, the patellar component was removed as well.  I then irrigated the canals with about a L of normal saline solution with pulse lavage with a canal brush irrigator both on the femoral and tibial side.  The knee was then irrigated with normal saline solution and pulse lavaged.  While this was being done, we had measured the femoral and  tibial components that were removed and selected the implants for this appropriate size mold for the femoral and tibial components.  These molds were then opened on the back table and cement mixed.  Two batches of cement were placed into the cement mold and a single batch of cement into the tibial mold.  Once these had fully cured and once this knee had been fully irrigated and debrided to a satisfactory extent, the final components were then cemented into place using another 2 batch of the cement combined on both the femoral and tibial side.  The knee was maintained in extension while the cement fully cured.  The knee was re-irrigated with normal saline solution while it was setting up.  At this point, the extensor mechanism was reapproximated over a closed Hemovac drain using #1 PDS sutures interrupted.  The remainder of the wound was closed with 2-0 Vicryl and staples on the skin.  The skin was then cleaned, dried, and dressed sterilely using a Xeroform and a bulky sterile wrap.  Hemovac was charged.  She was then brought to the recovery room, extubated in stable condition, tolerated the procedure well.     Pietro Cassis Alvan Dame, M.D.     MDO/MEDQ  D:  09/21/2013  T:  09/22/2013  Job:  [id number]

## 2013-09-22 NOTE — Progress Notes (Signed)
   Subjective: 1 Day Post-Op Procedure(s) (LRB): RIGHT TOTAL KNEE RESECTION WITH ANTIBIOTIC SPACER (Right)   Patient reports pain as mild, pain controlled. No events throughout the night.   Objective:   VITALS:   Filed Vitals:   09/22/13 0800  BP: 117/74  Pulse: 87  Temp: 99.5 F (37.5 C)   Resp: 18    Neurovascular intact Dorsiflexion/Plantar flexion intact Incision: dressing C/D/I No cellulitis present Compartment soft  LABS  Recent Labs  09/20/13 0610 09/21/13 0520 09/22/13 0355  HGB 9.9* 10.1* 9.2*  HCT 31.2* 30.8* 28.8*  WBC 9.4 8.8 9.8  PLT 218 228 237     Recent Labs  09/20/13 0550 09/21/13 0520 09/22/13 0355  NA 134* 137 133*  K 4.3 4.6 5.1  BUN 25* 31* 38*  CREATININE 1.05 1.37* 2.55*  GLUCOSE 121* 120* 100*     Assessment/Plan: 1 Day Post-Op Procedure(s) (LRB): RIGHT TOTAL KNEE RESECTION WITH ANTIBIOTIC SPACER (Right) 25% WB on the right knee HV drain d/c'ed Advance diet Up with therapy D/C IV fluids Discharge to SNF eventually, when ready   Amy Burns   PAC  09/22/2013, 10:09 AM

## 2013-09-22 NOTE — Progress Notes (Signed)
Physical Therapy Treatment Patient Details Name: Amy Burns MRN: [id number] DOB: Feb 01, 1941 Today's Date: 09/22/2013 Time: 1410-1444 PT Time Calculation (min): 34 min  PT Assessment / Plan / Recommendation  History of Present Illness 73 y.o. female admitted with R knee pain. infected TKA.S/P Resection and antibiotic spacer. acute renal failure.   PT Comments   Pt  Tolerated well. Pt's HR 112 after mobility, dyspnea 3/. Sats 90% RA, replace 2 l.Pt transferring to tele.  Follow Up Recommendations  SNF     Does the patient have the potential to tolerate intense rehabilitation     Barriers to Discharge Decreased caregiver support;Inaccessible home environment      Equipment Recommendations  None recommended by PT    Recommendations for Other Services    Frequency Min 6X/week   Progress towards PT Goals Progress towards PT goals: Progressing toward goals  Plan Current plan remains appropriate    Precautions / Restrictions Precautions Precautions: Fall Precaution Comments: No ROM R knee until clarified by Dr. Alvan Dame. Required Braces or Orthoses: Knee Immobilizer - Right Knee Immobilizer - Right: On when out of bed or walking Restrictions Weight Bearing Restrictions: Yes   Pertinent Vitals/Pain "The clunking bothers me"    Mobility  Bed Mobility Overal bed mobility: Needs Assistance;+2 for physical assistance Bed Mobility: Sit to Supine Supine to sit: Max assist;+2 for physical assistance Sit to supine: Max assist;+2 for physical assistance General bed mobility comments: support R leg to bed Transfers Overall transfer level: Needs assistance Equipment used: Rolling walker (2 wheeled) Transfers: Sit to/from [facility] Sit to Stand: +2 physical assistance;Mod assist Stand pivot transfers: +2 physical assistance;Mod assist General transfer comment: assist to rise, VCs hand placement, pt had difficulty taking a step with 25% WB, pt scooted around on L foot.     Exercises     PT Diagnosis: Difficulty walking;Acute pain  PT Problem List: Decreased strength;Decreased activity tolerance;Pain;Decreased mobility;Obesity PT Treatment Interventions: DME instruction;Gait training;Functional mobility training;Therapeutic exercise;Therapeutic activities;Patient/family education   PT Goals (current goals can now be found in the care plan section) Acute Rehab PT Goals Patient Stated Goal: to go home PT Goal Formulation: With patient/family Time For Goal Achievement: 09/29/13 Potential to Achieve Goals: Good  Visit Information  Last PT Received On: 09/22/13 Assistance Needed: +2 PT/OT/SLP Co-Evaluation/Treatment: Yes Reason for Co-Treatment: For patient/therapist safety PT goals addressed during session: Mobility/safety with mobility History of Present Illness: 73 y.o. female admitted with R knee pain. infected TKA.S/P Resection and antibiotic spacer. acute renal failure.    Subjective Data  Patient Stated Goal: to go home   Cognition  Cognition Arousal/Alertness: Awake/alert    Balance     End of Session PT - End of Session Equipment Utilized During Treatment: Gait belt Activity Tolerance: Patient limited by fatigue;Patient limited by pain Patient left: in bed;with call bell/phone within reach;with family/visitor present Nurse Communication: Mobility status   GP     Claretha Cooper 09/22/2013, 4:50 PM

## 2013-09-22 NOTE — Progress Notes (Signed)
Some shearing and redness noted on pt back.Marland KitchenMarland KitchenReport called to Horris Latino, [employer] on 4th floor..the patient transported via bed to 4th floor for tele.

## 2013-09-22 NOTE — Evaluation (Signed)
Occupational Therapy Evaluation Patient Details Name: Amy Burns MRN: [id number] DOB: 1940-09-25 Today's Date: 09/22/2013 Time: 7262-0355 OT Time Calculation (min): 18 min  OT Assessment / Plan / Recommendation History of present illness 73 y.o. female admitted with R knee pain. infected TKA.S/P Resection and antibiotic spacer.   Clinical Impression   This 73 yo female admitted and under went above presents to acute OT with decreased AROM RLE, increased pain RLE, obesity, and 25% WB'ing--all affecting pt's ability to care for herself. Feel best option for pt would be [facility] SNF.     OT Assessment  Patient needs continued OT Services    Follow Up Recommendations  SNF    Barriers to Discharge Decreased caregiver support    Equipment Recommendations  None recommended by OT       Frequency  Min 2X/week    Precautions / Restrictions Precautions Precautions: Fall Required Braces or Orthoses: Knee Immobilizer - Right Knee Immobilizer - Right: On when out of bed or walking Restrictions Weight Bearing Restrictions: Yes RLE Weight Bearing: Partial weight bearing RLE Partial Weight Bearing Percentage or Pounds: 25%   Pertinent Vitals/Pain 3/10 Rknee    ADL  Toilet Transfer: +2 Total assistance Toilet Transfer: Patient Percentage: 50% Toilet Transfer Method: Stand pivot Science writer: Bedside commode Toileting - Clothing Manipulation and Hygiene: +1 Total assistance (with another one for maintaining standing) Where Assessed - Camera operator Manipulation and Hygiene: Standing Equipment Used: Gait belt;Rolling walker;Knee Immobilizer Transfers/Ambulation Related to ADLs: see mobility section ADL Comments: Pt min A for UBB/D due to body habitus and total A for LBADLs due to body habitus and RLE restrictions    OT Diagnosis: Generalized weakness;Acute pain  OT Problem List: Decreased strength;Decreased range of motion;Decreased activity tolerance;Impaired balance  (sitting and/or standing);Pain;Decreased knowledge of precautions;Decreased knowledge of use of DME or AE;Obesity OT Treatment Interventions: Self-care/ADL training;Therapeutic activities;DME and/or AE instruction;Patient/family education   OT Goals(Current goals can be found in the care plan section) Acute Rehab OT Goals Patient Stated Goal: to go home OT Goal Formulation: With patient Time For Goal Achievement: 10/06/13 Potential to Achieve Goals: Good  Visit Information  Last OT Received On: 09/22/13 Assistance Needed: +2 PT/OT/SLP Co-Evaluation/Treatment: Yes (partial) Reason for Co-Treatment: For patient/therapist safety PT goals addressed during session: Mobility/safety with mobility OT goals addressed during session: ADL's and self-care;Proper use of Adaptive equipment and DME History of Present Illness: 73 y.o. female admitted with R knee pain. infected TKA.S/P Resection and antibiotic spacer.       Prior [place] expects to be discharged to:: Private residence Living Arrangements: Spouse/significant other Available Help at Discharge: Family;Available 24 hours/day Type of Home: House Home Access: Stairs to enter [employer] of Steps: 1 then 1 Entrance Stairs-Rails: None Home Layout: One level Home Equipment: Walker - 2 wheels;Cane - single point;Bedside commode Prior Function Level of Independence: Independent Communication Communication: No difficulties Dominant Hand: Right         Vision/Perception Vision - History Patient Visual Report: No change from baseline   Cognition  Cognition Arousal/Alertness: Awake/alert Behavior During Therapy: WFL for tasks assessed/performed Overall Cognitive Status:  (safety awareness with being able to go home with 25%WB'ing)    Extremity/Trunk Assessment Upper Extremity Assessment Upper Extremity Assessment: Generalized weakness     Mobility Bed Mobility Overal bed  mobility: Needs Assistance;+2 for physical assistance Bed Mobility: Supine to Sit;Sit to Supine Supine to sit: Max assist;+2 for physical assistance Transfers Overall transfer level:  Needs assistance Equipment used: Rolling walker (2 wheeled);2 person hand held assist Transfers: Sit to/from [facility] Sit to Stand: Max assist;+2 physical assistance Stand pivot transfers: Max assist;+2 physical assistance General transfer comment: assist to rise, VCs hand placement           End of Session OT - End of Session Equipment Utilized During Treatment: Gait belt;Rolling walker Activity Tolerance: Patient limited by fatigue Patient left: in chair;with call bell/phone within reach;with family/visitor present       Almon Register [phone] 09/22/2013, 12:53 PM

## 2013-09-22 NOTE — Consult Note (Signed)
Renal Service Consult Note [facility]  [name] 09/22/2013 Roney Jaffe D Requesting Physician:  Dr Broadus John  Reason for Consult:  Acute renal failure HPI: The patient is a 73 y.o. year-old with hx of obesity, HTN and R TKR.  She was admitted on 09/13/13 with R knee pain and swelling due to infected TKR. Surgery was postponed due to concerns about CHF and pt rec'd some IV lasix 40 mg several doses over several days. Ortho surg was done yesterday with removal of prothesis and placement of spacer using cement mixed in with vanc and gentamicin , 5gm each.  Yesterday creat was up at 1.37 from 0.95 on admit.  Today creat is 2.55.  There have been no hypotension episodes, no BP meds. Vanc levels were 11 on the 11th, 22 on the 13th and 37 yesterday evening.   Pt has many complaints. No SOB or cough, hoarse voice.   No orthopnea. CXR yest showed early pulm edema I/O since admit ix +1.1 L, weight is down from 136 > 133.5kg.    ROS  no hx renal failure, no nsaids  no abd pain  no nv/d  no CP or fever  Past Medical History  Past Medical History  Diagnosis Date  . HYPOTHYROIDISM 12/11/2008  . 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 HYPERTENSION 12/11/2008  . OSTEOARTHRITIS 12/11/2008  . ALOPECIA 12/11/2008  . Shortness of breath   . Pulmonary embolism     bilateral  . Orthopnea   . Measles     as child  . History of chicken pox     as child  . Cancer     left breast  . Sleep apnea     can not wear CPAP  . Headache(784.0)   . Complication of anesthesia     "woke up after surgery and could not breath"   Past Surgical History  Past Surgical History  Procedure Laterality Date  . Thyroidectomy, partial  2002  . Breast surgery  2007    cancer  . Knee surgery  2009    left  . Total knee arthroplasty Right 01/30/2013    Procedure: RIGHT TOTAL KNEE ARTHROPLASTY;  Surgeon: Magnus Sinning, MD;  Location: [facility];  Service: Orthopedics;  Laterality: Right;  . Excisional total knee  arthroplasty with antibiotic spacers Right 09/21/2013    Procedure: RIGHT TOTAL KNEE RESECTION WITH ANTIBIOTIC SPACER;  Surgeon: Mauri Pole, MD;  Location: [facility];  Service: Orthopedics;  Laterality: Right;   Family History  Family History  Problem Relation Age of Onset  . Heart disease Father 31  . Cancer Maternal Aunt     breast   Social History  reports that she has never smoked. She has never used smokeless tobacco. She reports that she does not drink alcohol or use illicit drugs. Allergies  Allergies  Allergen Reactions  . Meloxicam     REACTION: itiching   Home medications Prior to Admission medications   Medication Sig Start Date End Date Taking? Authorizing Provider  acetaminophen (TYLENOL) 500 MG tablet Two tabs in the AM and as needed in the evening   Yes Historical Provider, MD  aspirin 81 MG tablet Take 81 mg by mouth daily.   Yes Historical Provider, MD  HYDROcodone-acetaminophen (NORCO/VICODIN) 5-325 MG per tablet Take 1 tablet by mouth every 6 (six) hours as needed for moderate pain.   Yes Historical Provider, MD  levothyroxine (SYNTHROID, LEVOTHROID) 125 MCG tablet Take 125 mcg by mouth daily before breakfast.  Yes Historical Provider, MD  lisinopril (PRINIVIL,ZESTRIL) 40 MG tablet Take 1 tablet (40 mg total) by mouth every morning. 05/14/13  Yes Eulas Post, MD  metFORMIN (GLUCOPHAGE) 1000 MG tablet Take 1,000 mg by mouth every morning.   Yes Historical Provider, MD   Liver Function Tests No results found for this basename: AST, ALT, ALKPHOS, BILITOT, PROT, ALBUMIN,  in the last 168 hours No results found for this basename: LIPASE, AMYLASE,  in the last 168 hours CBC  Recent Labs Lab 09/20/13 0610 09/21/13 0520 09/22/13 0355  WBC 9.4 8.8 9.8  HGB 9.9* 10.1* 9.2*  HCT 31.2* 30.8* 28.8*  MCV 91.2 90.6 92.9  PLT 218 228 491   Basic Metabolic Panel  Recent Labs Lab 09/16/13 0400 09/17/13 0404 09/18/13 0410 09/19/13 0545 09/20/13 0550  09/21/13 0520 09/22/13 0355  NA 136* 137 138 139 134* 137 133*  K 4.3 4.5 4.4 4.5 4.3 4.6 5.1  CL 100 99 100 100 96 98 95*  CO2 26 26 29 30 29 29 28   GLUCOSE 131* 124* 128* 119* 121* 120* 100*  BUN 31* 24* 24* 24* 25* 31* 38*  CREATININE 0.95 0.81 0.83 0.95 1.05 1.37* 2.55*  CALCIUM 8.0* 8.1* 8.3* 8.0* 8.0* 7.6* 7.3*    Exam  Blood pressure 135/71, pulse 88, temperature 98.4 F (36.9 C), temperature source Oral, resp. rate 20, height 5\' 2"  (1.575 m), weight 133.448 kg (294 lb 3.2 oz), SpO2 100.00%. Alert obese WF up in chair, no distress No rash, cyanosis Sclera anicteric, throat clear +JVD Chest bilateral insp rales 1/3 up post RRR no MRG Abd obese, soft, nt/nd, no ascites GU foley draining cloudy brownish urine R leg in brace, bilat 2+ pitting edema lower legs, 1+ hips and dependent areas Neuro is alert and nf, ox3  UA- pend [place]- pend   Assessment/Plan: 1. Acute kidney injury: likely toxic ATN from antibiotics, vanc and/or gent. She has been getting IV vanc for 8 days and had 5 gm each of vanc and gentamicin mixed in with spacer cement for knee surgery yesterday. There are reports of orthopedic cement antibiotics getting into bloodstream and causing AKI from toxic levels.  Recommend stop IV vanc and check vanc and gent levels. She is vol overloaded with early pulm edema on exam and xray, will start IV lasix, start fluid restriction as well. Continue other supportive care, avoid nephrotoxins.  Renal US, urine lytes, daily bmet.  Will follow.   Kelly Splinter MD (pgr) [phone]    [phone] 09/22/2013, 1:52 PM

## 2013-09-22 NOTE — Progress Notes (Signed)
ANTIBIOTIC CONSULT NOTE - FOLLOW UP  Pharmacy Consult for Vancomycin Indication: septic arthritis/PJI   Allergies  Allergen Reactions  . Meloxicam     REACTION: itiching    Patient Measurements: Height: 5\' 2"  (157.5 cm) Weight: 294 lb 3.2 oz (133.448 kg) IBW/kg (Calculated) : 50.1 Adjusted Body Weight:   Vital Signs: Temp: 97.9 F (36.6 C) (01/17 0130) Temp src: Oral (01/17 0130) BP: 123/62 mmHg (01/17 0130) Pulse Rate: 88 (01/17 0130) Intake/Output from previous day: 01/16 0701 - 01/17 0700 In: 2230 [P.O.:480; I.V.:1550; IV Piggyback:200] Out: 578 [Urine:193; Drains:285; Blood:100] Intake/Output from this shift: Total I/O In: 120 [P.O.:120] Out: 60 [Drains:60]  Labs:  Recent Labs  09/20/13 0550 09/20/13 0610 09/21/13 0520 09/22/13 0355  WBC  --  9.4 8.8 9.8  HGB  --  9.9* 10.1* 9.2*  PLT  --  218 228 237  CREATININE 1.05  --  1.37* 2.55*   Estimated Creatinine Clearance: 25.9 ml/min (by C-G formula based on Cr of 2.55).  Recent Labs  09/21/13 2101  [place] 37.3*     Microbiology: Recent Results (from the past 720 hour(s))  ANAEROBIC CULTURE     Status: None   Collection Time    09/13/13  8:00 PM      Result Value Range Status   Specimen Description KNEE RIGHT KNEE JOINT   Final   Special Requests ANAC ONLY   Final   Gram Stain     Final   Value: ABUNDANT WBC PRESENT,BOTH PMN AND MONONUCLEAR     NO SQUAMOUS EPITHELIAL CELLS SEEN     MODERATE GRAM POSITIVE COCCI     IN PAIRS IN CLUSTERS     Performed at [facility]   Culture     Final   Value: NO ANAEROBES ISOLATED     Performed at [facility]   Report Status 09/19/2013 FINAL   Final  BODY FLUID CULTURE     Status: None   Collection Time    09/14/13  4:27 PM      Result Value Range Status   Specimen Description KNEE   Final   Special Requests NONE   Final   Gram Stain     Final   Value: ABUNDANT WBC PRESENT,BOTH PMN AND MONONUCLEAR     MODERATE GRAM POSITIVE COCCI     IN PAIRS IN CLUSTERS Gram Stain Report Called to,Read Back By and Verified With: Gram Stain Report Called to,Read Back By and Verified With: DONNA A @1131  ON 09/15/13 BY SMIAS     Performed at [facility]   Culture     Final   Value: ABUNDANT METHICILLIN RESISTANT STAPHYLOCOCCUS AUREUS     Note: RIFAMPIN AND GENTAMICIN SHOULD NOT BE USED AS SINGLE DRUGS FOR TREATMENT OF STAPH INFECTIONS. CRITICAL RESULT CALLED TO, READ BACK BY AND VERIFIED WITH: CAITLYN TAYLOR 09/16/13 @ 9:12AM BY RUSCOE A.     Performed at [facility]   Report Status 09/16/2013 FINAL   Final   Organism ID, Bacteria METHICILLIN RESISTANT STAPHYLOCOCCUS AUREUS   Final  SURGICAL PCR SCREEN     Status: Abnormal   Collection Time    09/16/13  4:47 PM      Result Value Range Status   MRSA, PCR POSITIVE (*) NEGATIVE Final   Comment: RESULT CALLED TO, READ BACK BY AND VERIFIED WITH:     K.BUTCHEE AT 1818 ON 11JAN15 BY C.BONGEL   Staphylococcus aureus POSITIVE (*) NEGATIVE Final   Comment:  The Xpert SA Assay (FDA     approved for NASAL specimens     in patients over 33 years of age),     is one component of     a comprehensive surveillance     program.  Test performance has     been validated by [employer] for patients greater     than or equal to 12 year old.     It is not intended     to diagnose infection nor to     guide or monitor treatment.  MRSA PCR SCREENING     Status: Abnormal   Collection Time    09/17/13  7:33 AM      Result Value Range Status   MRSA by PCR POSITIVE (*) NEGATIVE Final   Comment:            The GeneXpert MRSA Assay (FDA     approved for NASAL specimens     only), is one component of a     comprehensive MRSA colonization     surveillance program. It is not     intended to diagnose MRSA     infection nor to guide or     monitor treatment for     MRSA infections.     RESULT CALLED TO, READ BACK BY AND VERIFIED WITH:     EAldean Ast RN AT 0940 ON 1.12.15  BY SHUEA    Anti-infectives   Start     Dose/Rate Route Frequency Ordered Stop   09/21/13 0901  vancomycin (VANCOCIN) powder  Status:  Discontinued       As needed 09/21/13 0902 09/21/13 0956   09/21/13 0900  tobramycin (NEBCIN) powder  Status:  Discontinued       As needed 09/21/13 0901 09/21/13 0956   09/21/13 0715  tobramycin (NEBCIN) powder 1.2 g     1.2 g Topical To Surgery 09/21/13 0712 09/22/13 0715   09/18/13 2115  vancomycin (VANCOCIN) IVPB 1000 mg/200 mL premix  Status:  Discontinued     1,000 mg 200 mL/hr over 60 Minutes Intravenous Every 12 hours 09/18/13 2112 09/21/13 2322   09/16/13 2030  vancomycin (VANCOCIN) 1,250 mg in sodium chloride 0.9 % 250 mL IVPB  Status:  Discontinued     1,250 mg 166.7 mL/hr over 90 Minutes Intravenous Every 12 hours 09/16/13 2016 09/18/13 2112   09/15/13 2200  ceFEPIme (MAXIPIME) 2 g in dextrose 5 % 50 mL IVPB  Status:  Discontinued     2 g 100 mL/hr over 30 Minutes Intravenous Every 12 hours 09/15/13 1751 09/16/13 1204   09/15/13 1200  ceFEPIme (MAXIPIME) 2 g in dextrose 5 % 50 mL IVPB  Status:  Discontinued     2 g 100 mL/hr over 30 Minutes Intravenous Every 24 hours 09/14/13 1744 09/15/13 1751   09/14/13 2000  vancomycin (VANCOCIN) 1,750 mg in sodium chloride 0.9 % 500 mL IVPB  Status:  Discontinued     1,750 mg 250 mL/hr over 120 Minutes Intravenous Every 24 hours 09/13/13 1721 09/16/13 2016   09/14/13 0200  piperacillin-tazobactam (ZOSYN) IVPB 3.375 g  Status:  Discontinued     3.375 g 12.5 mL/hr over 240 Minutes Intravenous Every 8 hours 09/13/13 1721 09/13/13 1735   09/13/13 1800  vancomycin (VANCOCIN) 2,500 mg in sodium chloride 0.9 % 500 mL IVPB     2,500 mg 250 mL/hr over 120 Minutes Intravenous  Once 09/13/13 1720 09/13/13 2305   09/13/13  1800  ceFEPIme (MAXIPIME) 1 g in dextrose 5 % 50 mL IVPB  Status:  Discontinued     1 g 100 mL/hr over 30 Minutes Intravenous Every 8 hours 09/13/13 1746 09/14/13 1744   09/13/13 1730   piperacillin-tazobactam (ZOSYN) IVPB 3.375 g  Status:  Discontinued     3.375 g 100 mL/hr over 30 Minutes Intravenous STAT 09/13/13 1720 09/13/13 1735      Assessment: Patient with worsening renal function (SCr increase to 2.6) and vancomycin level high at 37.3.  PM dose held and current dosing d/c'd.  Will recheck level in ~24hr from last level.  Goal of Therapy:  Vancomycin trough level 15-20 mcg/ml  Plan:  Measure antibiotic drug levels at steady state Follow up culture results Hold current vancomycin, recheck level at 2000 1/17  Tyler Deis, Shea Stakes Crowford 09/22/2013,5:51 AM

## 2013-09-22 NOTE — Evaluation (Signed)
Physical Therapy Evaluation Patient Details Name: Amy Burns MRN: [id number] DOB: 1940-11-20 Today's Date: 09/22/2013 Time: 1130-1205 PT Time Calculation (min): 35 min  PT Assessment / Plan / Recommendation History of Present Illness  73 y.o. female admitted with R knee pain. infected TKA.S/P Resection and antibiotic spacer.  Clinical Impression  Pt tolerated transfer to recliner. Pt will benefit from PT to address problems. Recommend SNF.    PT Assessment  Patient needs continued PT services    Follow Up Recommendations  SNF    Does the patient have the potential to tolerate intense rehabilitation      Barriers to Discharge Decreased caregiver support;Inaccessible home environment      Equipment Recommendations  None recommended by PT    Recommendations for Other Services     Frequency Min 6X/week    Precautions / Restrictions Precautions Precautions: Fall Precaution Comments: No ROM R knee until clarified by Dr. Alvan Dame. Required Braces or Orthoses: Knee Immobilizer - Right Knee Immobilizer - Right: On when out of bed or walking Restrictions Weight Bearing Restrictions: Yes RLE Weight Bearing: Partial weight bearing RLE Partial Weight Bearing Percentage or Pounds: 25%   Pertinent Vitals/Pain Not c/o pain      Mobility  Bed Mobility Overal bed mobility: Needs Assistance;+2 for physical assistance Bed Mobility: Supine to Sit;Sit to Supine Supine to sit: Max assist;+2 for physical assistance General bed mobility comments: support R leg to edge and to floor. Transfers Overall transfer level: Needs assistance Equipment used: Rolling walker (2 wheeled) Transfers: Sit to/from [employer] Sit to Stand: Max assist;+2 physical assistance Stand pivot transfers: Max assist;+2 physical assistance General transfer comment: assist to rise, VCs hand placement, pt had difficulty taking a step with 25% WB, pt scooted around on L foot.    Exercises     PT  Diagnosis: Difficulty walking;Acute pain  PT Problem List: Decreased strength;Decreased activity tolerance;Pain;Decreased mobility;Obesity PT Treatment Interventions: DME instruction;Gait training;Functional mobility training;Therapeutic exercise;Therapeutic activities;Patient/family education     PT Goals(Current goals can be found in the care plan section) Acute Rehab PT Goals Patient Stated Goal: to go home PT Goal Formulation: With patient/family Time For Goal Achievement: 09/29/13 Potential to Achieve Goals: Good  Visit Information  Last PT Received On: 09/22/13 Assistance Needed: +2 PT/OT/SLP Co-Evaluation/Treatment: Yes Reason for Co-Treatment: For patient/therapist safety PT goals addressed during session: Mobility/safety with mobility OT goals addressed during session: ADL's and self-care;Proper use of Adaptive equipment and DME History of Present Illness: 73 y.o. female admitted with R knee pain. infected TKA.S/P Resection and antibiotic spacer.       Prior Anselmo expects to be discharged to:: Private residence Living Arrangements: Spouse/significant other Available Help at Discharge: Family;Available 24 hours/day Type of Home: House Home Access: Stairs to enter [employer] of Steps: 1 then 1 Entrance Stairs-Rails: None Home Layout: One level Home Equipment: Walker - 2 wheels;Cane - single point;Bedside commode Prior Function Level of Independence: Independent Communication Communication: No difficulties Dominant Hand: Right    Cognition  Cognition Arousal/Alertness: Awake/alert Behavior During Therapy: WFL for tasks assessed/performed Overall Cognitive Status:  (safety awareness with being able to go home with 25%WB'ing)    Extremity/Trunk Assessment Upper Extremity Assessment Upper Extremity Assessment: Generalized weakness Lower Extremity Assessment Lower Extremity Assessment: RLE deficits/detail RLE Deficits /  Details: requires assist to raise leg.   Balance    End of Session PT - End of Session Equipment Utilized During Treatment: Gait belt Activity Tolerance: Patient limited  by fatigue;Patient limited by pain Patient left: in chair;with call bell/phone within reach;with nursing/sitter in room Nurse Communication: Mobility status  GP     Claretha Cooper 09/22/2013, 1:03 PM

## 2013-09-22 NOTE — Progress Notes (Addendum)
[facility] PROGRESS NOTE  Amy Burns OVF:[id number] DOB: 10-Jan-1941 DOA: 09/27/13 PCP: Eulas Post, MD  Brief narrative: 73 year old female with history of bilateral knee replacement by Dr. Gladstone Lighter several years ago now presents to [facility] ED 2013/09/27 with main concern of one week duration of progressively worsening right knee pain, throbbing and constant, radiating to the right ankle, 10/10 in severity, associated with erythema and swelling, worse with ambulation and somewhat improved with rest, otherwise with no specific alleviating factors, no similar events in the past.  After admission she was found to be volume overloaded and diuresed with IV lasix Underwent R knee arthroplasty 1/16  Assessment and Plan:   Principal Problem:  MRSA R knee septic arthritis - status post right knee joint aspiration 09-27-13  - joint fluid positive for MRSA; will need long term antibiotics; PICC line placed - On vancomycin, day #9, dose held due to toxicity - pain management with dilaudid 1 mg every 2 hours IV PRN severe pain and norco PO PRN moderate pain - s/p OR yesterday, Abx spacer placed  Acute CHF -likely diastolic -improved now, lasix on hold -was being diuresed with IV lasix, this was held periop  -cath 2009 normal -ECHO done normal EF -today with mild hypoxia and exam concerning for volume overload -will check STAT CXR now, hold IVF  Acute renal failure  - due to Vanc Toxicity -stop VAnc dosing per pharmacy, hopefully creatinine wont continue to trend up -monitor urine output -Bmet in am -will request Renal eval  Anemia of chronic disease  - Hg stable and with no signs of acute bleeding   Diabetes mellitus  - continue to hold Metformin due to ARF  - pt on SSI for now  - A1C 6.5 (2012/09/27)   Hpothyroidism  - continue synthroid   HTN  - continue to hold Lisinopril due to renal insufficiency (even though Cr improved at this time) and pending OR -continue  hydralazine PRN   Consultants:  Ortho  Procedures/Studies:  Dg Knee 1-2 Views Left 2013/09/27 Expected appearance after bilateral knee arthroplasties. No acute hardware complication. Limited evaluation for joint effusion, especially on the right.  Antibiotics:  Vancomycin 09-27-2022 -->  Maxipime 09/27/22 --> 01/11  Code Status: Full  DVT prophylaxis: Lovenox on hold for OR Family Communication: Pt and husband at bedside  Disposition Plan: SNF recommended, SW assisting D/C plan  HPI/Subjective: No events overnight, breathing ok, some sore thorat earlier   Objective: Filed Vitals:   09/22/13 0400 09/22/13 0540 09/22/13 0800 09/22/13 1024  BP:  117/74  120/72  Pulse:  87  86  Temp:  99.5 F (37.5 C)  98.9 F (37.2 C)  TempSrc:  Axillary  Oral  Resp: 20 20 18 18   Height:      Weight:      SpO2: 92% 99% 100% 100%    Intake/Output Summary (Last 24 hours) at 09/22/13 1115 Last data filed at 09/22/13 0608  Gross per 24 hour  Intake   1125 ml  Output   1183 ml  Net    -58 ml   Exam:  General: Pt is alert, follows commands appropriately, not in acute distress  Cardiovascular: Regular rate and rhythm, S1/S2, no murmurs, no rubs, no gallops  Respiratory: Clear to auscultation bilaterally, no wheezing,  Abdomen: Soft, non tender, non distended, bowel sounds present, no guarding  Extremities: R knee with erythema, swelling, 1plus LE pitting edema, pulses DP and PT palpable bilaterally  Neuro: Grossly nonfocal  Data Reviewed: Basic Metabolic Panel:  Recent Labs Lab 09/18/13 0410 09/19/13 0545 09/20/13 0550 09/21/13 0520 09/22/13 0355  NA 138 139 134* 137 133*  K 4.4 4.5 4.3 4.6 5.1  CL 100 100 96 98 95*  CO2 29 30 29 29 28   GLUCOSE 128* 119* 121* 120* 100*  BUN 24* 24* 25* 31* 38*  CREATININE 0.83 0.95 1.05 1.37* 2.55*  CALCIUM 8.3* 8.0* 8.0* 7.6* 7.3*   CBC:  Recent Labs Lab 09/18/13 0410 09/19/13 0545 09/20/13 0610 09/21/13 0520 09/22/13 0355  WBC 10.3 10.2 9.4  8.8 9.8  HGB 10.6* 9.7* 9.9* 10.1* 9.2*  HCT 32.5* 30.3* 31.2* 30.8* 28.8*  MCV 91.3 91.3 91.2 90.6 92.9  PLT 253 237 218 228 237    CBG:  Recent Labs Lab 09/21/13 1027 09/21/13 1258 09/21/13 1654 09/21/13 2232 09/22/13 0714  GLUCAP 134* 145* 109* 120* 108*    Recent Results (from the past 240 hour(s))  ANAEROBIC CULTURE     Status: None   Collection Time    09/13/13  8:00 PM      Result Value Range Status   Specimen Description KNEE RIGHT KNEE JOINT   Final   Special Requests ANAC ONLY   Final   Gram Stain     Final   Value: ABUNDANT WBC PRESENT,BOTH PMN AND MONONUCLEAR     NO SQUAMOUS EPITHELIAL CELLS SEEN     MODERATE GRAM POSITIVE COCCI     IN PAIRS IN CLUSTERS     Performed at [facility]   Culture     Final   Value: NO ANAEROBES ISOLATED     Performed at [facility]   Report Status 09/19/2013 FINAL   Final  BODY FLUID CULTURE     Status: None   Collection Time    09/14/13  4:27 PM      Result Value Range Status   Specimen Description KNEE   Final   Special Requests NONE   Final   Gram Stain     Final   Value: ABUNDANT WBC PRESENT,BOTH PMN AND MONONUCLEAR     MODERATE GRAM POSITIVE COCCI     IN PAIRS IN CLUSTERS Gram Stain Report Called to,Read Back By and Verified With: Gram Stain Report Called to,Read Back By and Verified With: DONNA A @1131  ON 09/15/13 BY SMIAS     Performed at [facility]   Culture     Final   Value: ABUNDANT METHICILLIN RESISTANT STAPHYLOCOCCUS AUREUS     Note: RIFAMPIN AND GENTAMICIN SHOULD NOT BE USED AS SINGLE DRUGS FOR TREATMENT OF STAPH INFECTIONS. CRITICAL RESULT CALLED TO, READ BACK BY AND VERIFIED WITH: CAITLYN TAYLOR 09/16/13 @ 9:12AM BY RUSCOE A.     Performed at [facility]   Report Status 09/16/2013 FINAL   Final   Organism ID, Bacteria METHICILLIN RESISTANT STAPHYLOCOCCUS AUREUS   Final  SURGICAL PCR SCREEN     Status: Abnormal   Collection Time    09/16/13  4:47 PM      Result Value  Range Status   MRSA, PCR POSITIVE (*) NEGATIVE Final   Comment: RESULT CALLED TO, READ BACK BY AND VERIFIED WITH:     K.BUTCHEE AT [id number] ON [id number] BY C.BONGEL   Staphylococcus aureus POSITIVE (*) NEGATIVE Final  MRSA PCR SCREENING     Status: Abnormal   Collection Time    09/17/13  7:33 AM      Result Value Range Status   MRSA by  PCR POSITIVE (*) NEGATIVE Final     Scheduled Meds: . aspirin EC  325 mg Oral BID  . ferrous sulfate  325 mg Oral TID PC  . insulin aspart  0-9 Units Subcutaneous TID WC  . levothyroxine  125 mcg Oral QAC breakfast  . polyethylene glycol  17 g Oral Daily  . sodium chloride  10 mL Intravenous Q12H   Continuous Infusions: . sodium chloride    . sodium chloride 0.9 % 1,000 mL with potassium chloride 10 mEq infusion 75 mL/hr at 09/22/13 4650     Domenic Polite, MD  [facility] Pager [phone]  If 7PM-7AM, please contact night-coverage [email or web address] Password [email or web address] 09/22/2013, 11:15 AM   LOS: 9 days

## 2013-09-22 NOTE — Progress Notes (Signed)
Pt recd from 6th floor, bases dim but no crackles heard, 100% on 2 liters, denies any SOB.  IV lasix given and urine specimens sent to lab.  Husband at bedside, no c/o.

## 2013-09-22 NOTE — Progress Notes (Signed)
Pt c/o what looks like "thrush". Text paged MD. Please assess on rounds.

## 2013-09-23 ENCOUNTER — Inpatient Hospital Stay (HOSPITAL_COMMUNITY): Payer: Medicare Other

## 2013-09-23 DIAGNOSIS — T8450XA Infection and inflammatory reaction due to unspecified internal joint prosthesis, initial encounter: Principal | ICD-10-CM

## 2013-09-23 DIAGNOSIS — R21 Rash and other nonspecific skin eruption: Secondary | ICD-10-CM

## 2013-09-23 DIAGNOSIS — Z96659 Presence of unspecified artificial knee joint: Secondary | ICD-10-CM

## 2013-09-23 DIAGNOSIS — Y849 Medical procedure, unspecified as the cause of abnormal reaction of the patient, or of later complication, without mention of misadventure at the time of the procedure: Secondary | ICD-10-CM

## 2013-09-23 DIAGNOSIS — N179 Acute kidney failure, unspecified: Secondary | ICD-10-CM

## 2013-09-23 DIAGNOSIS — Z113 Encounter for screening for infections with a predominantly sexual mode of transmission: Secondary | ICD-10-CM

## 2013-09-23 DIAGNOSIS — A4902 Methicillin resistant Staphylococcus aureus infection, unspecified site: Secondary | ICD-10-CM

## 2013-09-23 DIAGNOSIS — N289 Disorder of kidney and ureter, unspecified: Secondary | ICD-10-CM

## 2013-09-23 LAB — CBC
HCT: 28 % — ABNORMAL LOW (ref 36.0–46.0)
HEMOGLOBIN: 8.8 g/dL — AB (ref 12.0–15.0)
MCH: 29.4 pg (ref 26.0–34.0)
MCHC: 31.4 g/dL (ref 30.0–36.0)
MCV: 93.6 fL (ref 78.0–100.0)
PLATELETS: 258 10*3/uL (ref 150–400)
RBC: 2.99 MIL/uL — ABNORMAL LOW (ref 3.87–5.11)
RDW: 16 % — ABNORMAL HIGH (ref 11.5–15.5)
WBC: 8.5 10*3/uL (ref 4.0–10.5)

## 2013-09-23 LAB — BASIC METABOLIC PANEL
BUN: 43 mg/dL — ABNORMAL HIGH (ref 6–23)
CHLORIDE: 94 meq/L — AB (ref 96–112)
CO2: 26 mEq/L (ref 19–32)
Calcium: 7.1 mg/dL — ABNORMAL LOW (ref 8.4–10.5)
Creatinine, Ser: 2.36 mg/dL — ABNORMAL HIGH (ref 0.50–1.10)
GFR calc Af Amer: 22 mL/min — ABNORMAL LOW (ref >90)
GFR calc non Af Amer: 19 mL/min — ABNORMAL LOW (ref >90)
GLUCOSE: 126 mg/dL — AB (ref 70–99)
Potassium: 5.1 mEq/L (ref 3.7–5.3)
Sodium: 131 mEq/L — ABNORMAL LOW (ref 137–147)

## 2013-09-23 LAB — URINE CULTURE

## 2013-09-23 LAB — GLUCOSE, CAPILLARY
GLUCOSE-CAPILLARY: 149 mg/dL — AB (ref 70–99)
GLUCOSE-CAPILLARY: 127 mg/dL — AB (ref 70–99)
GLUCOSE-CAPILLARY: 139 mg/dL — AB (ref 70–99)
Glucose-Capillary: 127 mg/dL — ABNORMAL HIGH (ref 70–99)
Glucose-Capillary: 81 mg/dL (ref 70–99)

## 2013-09-23 LAB — CK: Total CK: 111 U/L (ref 7–177)

## 2013-09-23 LAB — SODIUM, URINE, RANDOM: Sodium, Ur: 43 mEq/L

## 2013-09-23 LAB — CREATININE, URINE, RANDOM: CREATININE, URINE: 155.4 mg/dL

## 2013-09-23 MED ORDER — DIPHENHYDRAMINE HCL 25 MG PO CAPS
25.0000 mg | ORAL_CAPSULE | Freq: Four times a day (QID) | ORAL | Status: DC | PRN
  Administered 2013-09-24 – 2013-09-25 (×5): 25 mg via ORAL
  Filled 2013-09-23 (×6): qty 1

## 2013-09-23 MED ORDER — SODIUM CHLORIDE 0.9 % IV SOLN
500.0000 mg | INTRAVENOUS | Status: DC
  Administered 2013-09-23: 500 mg via INTRAVENOUS
  Filled 2013-09-23: qty 10

## 2013-09-23 NOTE — Progress Notes (Signed)
  [facility] Progress Note   Subjective: Norva Karvonen level not measurable, good uop , creat down today to 2.36  Filed Vitals:   09/22/13 1600 09/22/13 2111 09/23/13 0629 09/23/13 0800  BP:  123/48 141/46   Pulse:  93 88   Temp:  97.6 F (36.4 C) 97.7 F (36.5 C)   TempSrc:  Axillary Oral   Resp: 18 20 19 18   Height:      Weight:   140.1 kg (308 lb 13.8 oz)   SpO2: 100% 98% 100%   Exam Alert, no distress  +JVD  Lungs clear today RRR no MRG  Abd obese, soft, nt/nd, no ascites  Foley draining cloudy brownish urine  R leg in brace, bilat 2+ pitting edema lower legs, 1+ hips Neuro alert, nf, ox3  UA- neg prot, 7-10 wbc, tntc rbc, few bact, +yeast UNa- 43, UCr 155 CXR 1/17 +edema mild-mod IS  Assess: 1. AKI from vanc toxicity; vanc d/c'd, will need alternate antibiotic 2. Septic prosthetic joint, s/p removal +MRSA 3. Pulm edema d/t vol excess- improving 4. HTN- no meds now, stable 5. Pyuria  Plan- cont diuretics, labs in am  Kelly Splinter MD pager [phone]    cell [phone] 09/23/2013, 8:46 AM   Recent Labs Lab 09/21/13 0520 09/22/13 0355 09/23/13 0411  NA 137 133* 131*  K 4.6 5.1 5.1  CL 98 95* 94*  CO2 29 28 26   GLUCOSE 120* 100* 126*  BUN 31* 38* 43*  CREATININE 1.37* 2.55* 2.36*  CALCIUM 7.6* 7.3* 7.1*   No results found for this basename: AST, ALT, ALKPHOS, BILITOT, PROT, ALBUMIN,  in the last 168 hours  Recent Labs Lab 09/20/13 0610 09/21/13 0520 09/22/13 0355  WBC 9.4 8.8 9.8  HGB 9.9* 10.1* 9.2*  HCT 31.2* 30.8* 28.8*  MCV 91.2 90.6 92.9  PLT 218 228 237   . aspirin EC  325 mg Oral BID  . ferrous sulfate  325 mg Oral TID PC  . furosemide  80 mg Intravenous Q12H  . insulin aspart  0-9 Units Subcutaneous TID WC  . levothyroxine  125 mcg Oral QAC breakfast  . polyethylene glycol  17 g Oral Daily  . sodium chloride  10 mL Intravenous Q12H   . sodium chloride 0.9 % 1,000 mL with potassium chloride 10 mEq infusion 75 mL/hr at  09/22/13 0608   hydrALAZINE, HYDROcodone-acetaminophen, HYDROmorphone (DILAUDID) injection, menthol-cetylpyridinium, ondansetron (ZOFRAN) IV, ondansetron, phenol, sodium chloride

## 2013-09-23 NOTE — Progress Notes (Signed)
Patient ID: Amy Burns, female   DOB: 09-28-1940, 73 y.o.   MRN: [id number] Subjective: 2 Days Post-Op Procedure(s) (LRB): RIGHT TOTAL KNEE RESECTION WITH ANTIBIOTIC SPACER (Right)    Patient reports pain as moderate.  Seems to be tolerating things fine.  Out of bed in chair this am Renal impairment perhaps related to [facility] discussed with primary team  Objective:   VITALS:   Filed Vitals:   09/23/13 0800  BP:   Pulse:   Temp:   Resp: 18    Neurovascular intact Incision: dressing C/D/I  LABS  Recent Labs  09/21/13 0520 09/22/13 0355 09/23/13 0411  HGB 10.1* 9.2* 8.8*  HCT 30.8* 28.8* 28.0*  WBC 8.8 9.8 8.5  PLT 228 237 258     Recent Labs  09/21/13 0520 09/22/13 0355 09/23/13 0411  NA 137 133* 131*  K 4.6 5.1 5.1  BUN 31* 38* 43*  CREATININE 1.37* 2.55* 2.36*  GLUCOSE 120* 100* 126*    No results found for this basename: LABPT, INR,  in the last 72 hours   Assessment/Plan: 2 Days Post-Op Procedure(s) (LRB): RIGHT TOTAL KNEE RESECTION WITH ANTIBIOTIC SPACER (Right)   Advance diet Up with therapy Continue ABX therapy due to Culture taken of surgical site during joint revision Discharge to SNF when appropriate  ID consult called for assistance with antibiotic coverage with all things considered medically and as for the joint

## 2013-09-23 NOTE — Progress Notes (Signed)
[facility] PROGRESS NOTE  Amy Burns PIR:[id number] DOB: 1941-05-29 DOA: 09/13/2013 PCP: Eulas Post, MD  Brief narrative: 74 year old female with history of bilateral knee replacement by Dr. Gladstone Lighter several years ago now presents to [facility] ED 09/13/2013 with main concern of one week duration of progressively worsening right knee pain, throbbing and constant, radiating to the right ankle, 10/10 in severity, associated with erythema and swelling, worse with ambulation and somewhat improved with rest, otherwise with no specific alleviating factors, no similar events in the past.  After admission she was found to be volume overloaded and diuresed with IV lasix Underwent R knee arthroplasty 1/16  Assessment and Plan:   Principal Problem:  MRSA R knee septic arthritis - status post right knee joint aspiration 09/13/2013  - joint fluid positive for MRSA; will need long term antibiotics; PICC line placed - On vancomycin, day #10, dose held due to toxicity, but should still be therapeutic on the levels - pain control, PT - s/p OR 1/17, Abx spacer placed -Will get ID involved Monday  Acute Diastolic CHF -volume status improved, cut down lasix/defer to Renal -appreciate dr.Schertz consult -she was volume overloaded and diuresed pre op too -cath 2009 normal -ECHO done normal EF  Acute renal failure  - due to Vanc Toxicity -stopped VAnc dosing per pharmacy,  creatinine slightly improved -defer diuretics to Renal -Appreciate Dr.Schertz consult -Urine output 1500 yesterday,  -Renal US pending  Anemia of chronic disease  - Hg stable   Diabetes mellitus  - continue to hold Metformin due to ARF  - pt on SSI for now  - A1C 6.5 (09/13/2012)   Hpothyroidism  - continue synthroid   HTN  - continue to hold Lisinopril due to renal insufficiency (even though Cr improved at this time) and pending OR -continue hydralazine PRN   Consultants:  Ortho  Procedures/Studies:  R knee  arthocentesis PROCEDURE: Resection of right total knee arthroplasty, placement of  antibiotic spacer, per Dr.Olin 1/16   Antibiotics:  Vancomycin 01/08 -->  Maxipime 01/08 --> 01/11  Code Status: Full  DVT prophylaxis: Lovenox on hold for OR Family Communication: Pt and husband at bedside  Disposition Plan: SNF recommended, SW assisting D/C plan  HPI/Subjective: No events overnight, breathing ok, some sore thorat   Objective: Filed Vitals:   09/22/13 1600 09/22/13 2111 09/23/13 0629 09/23/13 0800  BP:  123/48 141/46   Pulse:  93 88   Temp:  97.6 F (36.4 C) 97.7 F (36.5 C)   TempSrc:  Axillary Oral   Resp: 18 20 19 18   Height:      Weight:   140.1 kg (308 lb 13.8 oz)   SpO2: 100% 98% 100%     Intake/Output Summary (Last 24 hours) at 09/23/13 1041 Last data filed at 09/23/13 0600  Gross per 24 hour  Intake    360 ml  Output   1500 ml  Net  -1140 ml   Exam:  General: Pt is alert, follows commands appropriately, not in acute distress  Cardiovascular: Regular rate and rhythm, S1/S2, no murmurs, no rubs, no gallops  HEENt: no thrush, mild pharyngeal erythema Respiratory: Clear to auscultation bilaterally, diminished at bases Abdomen: Soft, non tender, non distended, bowel sounds present, no guarding  Extremities: R knee with erythema, swelling, 1plus LE pitting edema, pulses DP and PT palpable bilaterally  Neuro: Grossly nonfocal   Data Reviewed: Basic Metabolic Panel:  Recent Labs Lab 09/19/13 0545 09/20/13 0550 09/21/13 0520 09/22/13 0355 09/23/13  0411  NA 139 134* 137 133* 131*  K 4.5 4.3 4.6 5.1 5.1  CL 100 96 98 95* 94*  CO2 30 29 29 28 26   GLUCOSE 119* 121* 120* 100* 126*  BUN 24* 25* 31* 38* 43*  CREATININE 0.95 1.05 1.37* 2.55* 2.36*  CALCIUM 8.0* 8.0* 7.6* 7.3* 7.1*   CBC:  Recent Labs Lab 09/19/13 0545 09/20/13 0610 09/21/13 0520 09/22/13 0355 09/23/13 0411  WBC 10.2 9.4 8.8 9.8 8.5  HGB 9.7* 9.9* 10.1* 9.2* 8.8*  HCT 30.3* 31.2*  30.8* 28.8* 28.0*  MCV 91.3 91.2 90.6 92.9 93.6  PLT 237 218 228 237 258    CBG:  Recent Labs Lab 09/22/13 0714 09/22/13 1200 09/22/13 1642 09/22/13 2114 09/23/13 0733  GLUCAP 108* 147* 123* 149* 81    Recent Results (from the past 240 hour(s))  ANAEROBIC CULTURE     Status: None   Collection Time    09/13/13  8:00 PM      Result Value Range Status   Specimen Description KNEE RIGHT KNEE JOINT   Final   Special Requests ANAC ONLY   Final   Gram Stain     Final   Value: ABUNDANT WBC PRESENT,BOTH PMN AND MONONUCLEAR     NO SQUAMOUS EPITHELIAL CELLS SEEN     MODERATE GRAM POSITIVE COCCI     IN PAIRS IN CLUSTERS     Performed at [facility]   Culture     Final   Value: NO ANAEROBES ISOLATED     Performed at [facility]   Report Status 09/19/2013 FINAL   Final  BODY FLUID CULTURE     Status: None   Collection Time    09/14/13  4:27 PM      Result Value Range Status   Specimen Description KNEE   Final   Special Requests NONE   Final   Gram Stain     Final   Value: ABUNDANT WBC PRESENT,BOTH PMN AND MONONUCLEAR     MODERATE GRAM POSITIVE COCCI     IN PAIRS IN CLUSTERS Gram Stain Report Called to,Read Back By and Verified With: Gram Stain Report Called to,Read Back By and Verified With: DONNA A @1131  ON 09/15/13 BY SMIAS     Performed at [facility]   Culture     Final   Value: ABUNDANT METHICILLIN RESISTANT STAPHYLOCOCCUS AUREUS     Note: RIFAMPIN AND GENTAMICIN SHOULD NOT BE USED AS SINGLE DRUGS FOR TREATMENT OF STAPH INFECTIONS. CRITICAL RESULT CALLED TO, READ BACK BY AND VERIFIED WITH: CAITLYN TAYLOR 09/16/13 @ 9:12AM BY RUSCOE A.     Performed at [facility]   Report Status 09/16/2013 FINAL   Final   Organism ID, Bacteria METHICILLIN RESISTANT STAPHYLOCOCCUS AUREUS   Final  SURGICAL PCR SCREEN     Status: Abnormal   Collection Time    09/16/13  4:47 PM      Result Value Range Status   MRSA, PCR POSITIVE (*) NEGATIVE Final   Comment:  RESULT CALLED TO, READ BACK BY AND VERIFIED WITH:     K.BUTCHEE AT 1818 ON [id number] BY C.BONGEL   Staphylococcus aureus POSITIVE (*) NEGATIVE Final  MRSA PCR SCREENING     Status: Abnormal   Collection Time    09/17/13  7:33 AM      Result Value Range Status   MRSA by PCR POSITIVE (*) NEGATIVE Final     Scheduled Meds: . aspirin EC  325 mg Oral  BID  . ferrous sulfate  325 mg Oral TID PC  . furosemide  80 mg Intravenous Q12H  . insulin aspart  0-9 Units Subcutaneous TID WC  . levothyroxine  125 mcg Oral QAC breakfast  . polyethylene glycol  17 g Oral Daily  . sodium chloride  10 mL Intravenous Q12H   Continuous Infusions: . sodium chloride 0.9 % 1,000 mL with potassium chloride 10 mEq infusion 75 mL/hr at 09/22/13 0608     Domenic Polite, MD  [facility] Pager [phone]  If 7PM-7AM, please contact night-coverage [email or web address] Password [email or web address] 09/23/2013, 10:41 AM   LOS: 10 days

## 2013-09-23 NOTE — Progress Notes (Signed)
ANTIBIOTIC CONSULT NOTE - INITIAL  Pharmacy Consult for Daptomycin  Indication: PJI  Allergies  Allergen Reactions  . Meloxicam     REACTION: itiching    Patient Measurements: Height: 5\' 2"  (157.5 cm) Weight: 308 lb 13.8 oz (140.1 kg) IBW/kg (Calculated) : 50.1 Adjusted Body Weight: 86kg  Vital Signs: Temp: 97.7 F (36.5 C) (01/18 0629) Temp src: Oral (01/18 0629) BP: 141/46 mmHg (01/18 0629) Pulse Rate: 88 (01/18 0629) Intake/Output from previous day: 01/17 0701 - 01/18 0700 In: 360 [P.O.:360] Out: 1500 [Urine:1500] Intake/Output from this shift: Total I/O In: 360 [P.O.:360] Out: -   Labs:  Recent Labs  09/21/13 0520 09/22/13 0355 09/22/13 1553 09/23/13 0411  WBC 8.8 9.8  --  8.5  HGB 10.1* 9.2*  --  8.8*  PLT 228 237  --  258  LABCREA  --   --  155.4  --   CREATININE 1.37* 2.55*  --  2.36*   Estimated Creatinine Clearance: 28.9 ml/min (by C-G formula based on Cr of 2.36).  Recent Labs  09/21/13 2101 09/22/13 1430  VANCOTROUGH 37.3*  --   GENTRANDOM  --  <0.5     Microbiology: Recent Results (from the past 720 hour(s))  ANAEROBIC CULTURE     Status: None   Collection Time    09/13/13  8:00 PM      Result Value Range Status   Specimen Description KNEE RIGHT KNEE JOINT   Final   Special Requests ANAC ONLY   Final   Gram Stain     Final   Value: ABUNDANT WBC PRESENT,BOTH PMN AND MONONUCLEAR     NO SQUAMOUS EPITHELIAL CELLS SEEN     MODERATE GRAM POSITIVE COCCI     IN PAIRS IN CLUSTERS     Performed at [facility]   Culture     Final   Value: NO ANAEROBES ISOLATED     Performed at [facility]   Report Status 09/19/2013 FINAL   Final  BODY FLUID CULTURE     Status: None   Collection Time    09/14/13  4:27 PM      Result Value Range Status   Specimen Description KNEE   Final   Special Requests NONE   Final   Gram Stain     Final   Value: ABUNDANT WBC PRESENT,BOTH PMN AND MONONUCLEAR     MODERATE GRAM POSITIVE COCCI     IN  PAIRS IN CLUSTERS Gram Stain Report Called to,Read Back By and Verified With: Gram Stain Report Called to,Read Back By and Verified With: DONNA A @1131  ON 09/15/13 BY SMIAS     Performed at [facility]   Culture     Final   Value: ABUNDANT METHICILLIN RESISTANT STAPHYLOCOCCUS AUREUS     Note: RIFAMPIN AND GENTAMICIN SHOULD NOT BE USED AS SINGLE DRUGS FOR TREATMENT OF STAPH INFECTIONS. CRITICAL RESULT CALLED TO, READ BACK BY AND VERIFIED WITH: CAITLYN TAYLOR 09/16/13 @ 9:12AM BY RUSCOE A.     Performed at [facility]   Report Status 09/16/2013 FINAL   Final   Organism ID, Bacteria METHICILLIN RESISTANT STAPHYLOCOCCUS AUREUS   Final  SURGICAL PCR SCREEN     Status: Abnormal   Collection Time    09/16/13  4:47 PM      Result Value Range Status   MRSA, PCR POSITIVE (*) NEGATIVE Final   Comment: RESULT CALLED TO, READ BACK BY AND VERIFIED WITH:     K.BUTCHEE AT 1818  ON [id number] BY C.BONGEL   Staphylococcus aureus POSITIVE (*) NEGATIVE Final   Comment:            The Xpert SA Assay (FDA     approved for NASAL specimens     in patients over 3 years of age),     is one component of     a comprehensive surveillance     program.  Test performance has     been validated by [employer] for patients greater     than or equal to 6 year old.     It is not intended     to diagnose infection nor to     guide or monitor treatment.  MRSA PCR SCREENING     Status: Abnormal   Collection Time    09/17/13  7:33 AM      Result Value Range Status   MRSA by PCR POSITIVE (*) NEGATIVE Final   Comment:            The GeneXpert MRSA Assay (FDA     approved for NASAL specimens     only), is one component of a     comprehensive MRSA colonization     surveillance program. It is not     intended to diagnose MRSA     infection nor to guide or     monitor treatment for     MRSA infections.     RESULT CALLED TO, READ BACK BY AND VERIFIED WITH:     E. MACABUAG RN AT 0940 ON 1.12.15 BY  Dixie    Medical History: Past Medical History  Diagnosis Date  . HYPOTHYROIDISM 12/11/2008  . 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 HYPERTENSION 12/11/2008  . OSTEOARTHRITIS 12/11/2008  . ALOPECIA 12/11/2008  . Shortness of breath   . Pulmonary embolism     bilateral  . Orthopnea   . Measles     as child  . History of chicken pox     as child  . Cancer     left breast  . Sleep apnea     can not wear CPAP  . Headache(784.0)   . Complication of anesthesia     "woke up after surgery and could not breath"   Assessment: 73 yoF with hx BL knee replacements presents with MRSA PJI / septic arthritis R knee.  Underwent R knee arthroplasty 1/16 with antibiotic spacer placement.  Pt initially started on vancomycin, then developed AKI.  Vancomycin stopped and Daptomycin ordered by ID.    1/8 >> Vanc >> 1/17 (last dose given 1/16) 1/8 >> Cefepime >> 1/11 1/18 >> Daptomycin >>  Tm24h: afebrile WBC: continues wnl Renal: SCr elevated, today 2.36, CrCl ~29 ml/min  ABW = 86kg  Microbiology: 1/9 Joint aspirate: MRSA 1/17: Urine collected  Goal of Therapy:  Eradication of infection  Plan:  Daptomycin 6mg /kg q 48 hours (due to CrCl < 30), f/u renal fxn closely to adjust timing.   Stat baseline CK  Jomes Giraldo E 09/23/2013,11:55 AM

## 2013-09-23 NOTE — Consult Note (Signed)
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 Alvan Dame   HPI: Amy Burns is an 73 y.o. female with past neural history significant for bilateral knee replacement presents to the emergency room at [facility] with a one-week history of severe constant throbbing pain in her knee rate in the right ankle which was 10 out of 10 in severity. with erythema and swelling, worse with ambulation and somewhat improved with rest. She was admitted to the hospitalist service and placed on vancomycin and cefepime initially. Orthopedic surgery aspirated her knee and this yielded 138,118 white blood cells with 82% neutrophils. Cultures from the knee has some slight grown methicillin-resistant staph aureus. Patient was taken to the operating room on 09/22/2013 by Dr Alvan Dame and underwent PROCEDURE: Resection of right total knee arthroplasty, placement of  antibiotic spacer. The patient's postoperative course has been complicated by renal insufficiency and supratherapeutic vancomycin levels. Patient being followed closely by internal medicine and by nephrology. Gentamicin levels were assayed as well from blood and these have not been found to be detectable. Nephrology is convinced that the renal toxicity is due to vancomycin.  In the last few days patient is also developed a pruritic rash on her buttocks and back and this has extended onto arms.        Past Medical History  Diagnosis Date  . HYPOTHYROIDISM 12/11/2008  . 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 HYPERTENSION 12/11/2008  . OSTEOARTHRITIS 12/11/2008  . ALOPECIA 12/11/2008  . Shortness of breath   . Pulmonary embolism     bilateral  . Orthopnea   . Measles     as child  . History of chicken pox     as child  . Cancer     left breast  . Sleep apnea     can not wear CPAP  . Headache(784.0)     . Complication of anesthesia     "woke up after surgery and could not breath"    Past Surgical History  Procedure Laterality Date  . Thyroidectomy, partial  2002  . Breast surgery  2007    cancer  . Knee surgery  2009    left  . Total knee arthroplasty Right 01/30/2013    Procedure: RIGHT TOTAL KNEE ARTHROPLASTY;  Surgeon: Magnus Sinning, MD;  Location: [facility];  Service: Orthopedics;  Laterality: Right;  . Excisional total knee arthroplasty with antibiotic spacers Right 09/21/2013    Procedure: RIGHT TOTAL KNEE RESECTION WITH ANTIBIOTIC SPACER;  Surgeon: Mauri Pole, MD;  Location: [facility];  Service: Orthopedics;  Laterality: Right;  ergies:   Allergies  Allergen Reactions  . Meloxicam     REACTION: itiching     Medications: I have reviewed patients current medications as documented in Epic Anti-infectives   Start     Dose/Rate Route Frequency Ordered Stop   09/23/13 1400  DAPTOmycin (CUBICIN) 500 mg in sodium chloride 0.9 % IVPB     500 mg 220 mL/hr over 30 Minutes Intravenous Every 48 hours 09/23/13 1225     09/21/13 0901  vancomycin (VANCOCIN) powder  Status:  Discontinued       As needed 09/21/13 0902 09/21/13 0956   09/21/13 0900  tobramycin (NEBCIN) powder  Status:  Discontinued       As needed 09/21/13 0901 09/21/13 0956   09/21/13 0715  tobramycin (NEBCIN)  powder 1.2 g     1.2 g Topical To Surgery 09/21/13 0712 09/22/13 0715   09/18/13 2115  vancomycin (VANCOCIN) IVPB 1000 mg/200 mL premix  Status:  Discontinued     1,000 mg 200 mL/hr over 60 Minutes Intravenous Every 12 hours 09/18/13 2112 09/21/13 2322   09/16/13 2030  vancomycin (VANCOCIN) 1,250 mg in sodium chloride 0.9 % 250 mL IVPB  Status:  Discontinued     1,250 mg 166.7 mL/hr over 90 Minutes Intravenous Every 12 hours 09/16/13 2016 09/18/13 2112   09/15/13 2200  ceFEPIme (MAXIPIME) 2 g in dextrose 5 % 50 mL IVPB  Status:  Discontinued     2 g 100 mL/hr over 30 Minutes Intravenous Every 12 hours  09/15/13 1751 09/16/13 1204   09/15/13 1200  ceFEPIme (MAXIPIME) 2 g in dextrose 5 % 50 mL IVPB  Status:  Discontinued     2 g 100 mL/hr over 30 Minutes Intravenous Every 24 hours 09/14/13 1744 09/15/13 1751   09/14/13 2000  vancomycin (VANCOCIN) 1,750 mg in sodium chloride 0.9 % 500 mL IVPB  Status:  Discontinued     1,750 mg 250 mL/hr over 120 Minutes Intravenous Every 24 hours 09/13/13 1721 09/16/13 2016   09/14/13 0200  piperacillin-tazobactam (ZOSYN) IVPB 3.375 g  Status:  Discontinued     3.375 g 12.5 mL/hr over 240 Minutes Intravenous Every 8 hours 09/13/13 1721 09/13/13 1735   09/13/13 1800  vancomycin (VANCOCIN) 2,500 mg in sodium chloride 0.9 % 500 mL IVPB     2,500 mg 250 mL/hr over 120 Minutes Intravenous  Once 09/13/13 1720 09/13/13 2305   09/13/13 1800  ceFEPIme (MAXIPIME) 1 g in dextrose 5 % 50 mL IVPB  Status:  Discontinued     1 g 100 mL/hr over 30 Minutes Intravenous Every 8 hours 09/13/13 1746 09/14/13 1744   09/13/13 1730  piperacillin-tazobactam (ZOSYN) IVPB 3.375 g  Status:  Discontinued     3.375 g 100 mL/hr over 30 Minutes Intravenous STAT 09/13/13 1720 09/13/13 1735      Social History:  reports that she has never smoked. She has never used smokeless tobacco. She reports that she does not drink alcohol or use illicit drugs.  Family History  Problem Relation Age of Onset  . Heart disease Father 17  . Cancer Maternal Aunt     breast    As in HPI and primary teams notes otherwise 12 point review of systems is negative  Blood pressure 137/42, pulse 99, temperature 98.5 F (36.9 C), temperature source Oral, resp. rate 20, height _0  (1.575 m), weight 308 lb 13.8 oz (140.1 kg), SpO2 97.00%. General: Alert and awake, oriented x3, not in any acute distress. HEENT: anicteric sclera,, EOMI, oropharynx clear and without exudate CVS regular rate, normal r,  no murmur rubs or gallops Chest: clear to auscultation bilaterally, no wheezing, rales or rhonchi Abdomen:  soft nontender, nondistended, normal bowel sounds, Extremities: Right knee is bandaged left knee with clean incision site. Skin: She has a rash on her back with multiple linear areas of excoriation see picture:      She also has some lesions on her dorsum of her arms see pictures        Neuro: nonfocal, strength and sensation intact   Results for orders placed during the hospital encounter of 09/13/13 (from the past 48 hour(s))  GLUCOSE, CAPILLARY     Status: Abnormal   Collection Time    09/21/13  4:54 PM  Result Value Range   Glucose-Capillary 109 (*) 70 - 99 mg/dL  VANCOMYCIN, TROUGH     Status: Abnormal   Collection Time    09/21/13  9:01 PM      Result Value Range   Vancomycin Tr 37.3 (*) 10.0 - 20.0 ug/mL   Comment: CRITICAL RESULT CALLED TO, READ BACK BY AND VERIFIED WITH:     SPOKE WITH SIMONE,K RN [phone] HAMER,N  GLUCOSE, CAPILLARY     Status: Abnormal   Collection Time    09/21/13 10:32 PM      Result Value Range   Glucose-Capillary 120 (*) 70 - 99 mg/dL   Comment 1 Documented in Chart     Comment 2 Notify RN    CBC     Status: Abnormal   Collection Time    09/22/13  3:55 AM      Result Value Range   WBC 9.8  4.0 - 10.5 K/uL   RBC 3.10 (*) 3.87 - 5.11 MIL/uL   Hemoglobin 9.2 (*) 12.0 - 15.0 g/dL   HCT 28.8 (*) 36.0 - 46.0 %   MCV 92.9  78.0 - 100.0 fL   MCH 29.7  26.0 - 34.0 pg   MCHC 31.9  30.0 - 36.0 g/dL   RDW 16.3 (*) 11.5 - 15.5 %   Platelets 237  150 - 400 K/uL  BASIC METABOLIC PANEL     Status: Abnormal   Collection Time    09/22/13  3:55 AM      Result Value Range   Sodium 133 (*) 137 - 147 mEq/L   Potassium 5.1  3.7 - 5.3 mEq/L   Chloride 95 (*) 96 - 112 mEq/L   CO2 28  19 - 32 mEq/L   Glucose, Bld 100 (*) 70 - 99 mg/dL   BUN 38 (*) 6 - 23 mg/dL   Creatinine, Ser 2.55 (*) 0.50 - 1.10 mg/dL   Comment: DELTA CHECK NOTED     REPEATED TO VERIFY   Calcium 7.3 (*) 8.4 - 10.5 mg/dL   GFR calc non Af Amer 18 (*) >90 mL/min   GFR  calc Af Amer 20 (*) >90 mL/min   Comment: (NOTE)     The eGFR has been calculated using the CKD EPI equation.     This calculation has not been validated in all clinical situations.     eGFR's persistently <90 mL/min signify possible Chronic Kidney     Disease.  GLUCOSE, CAPILLARY     Status: Abnormal   Collection Time    09/22/13  7:14 AM      Result Value Range   Glucose-Capillary 108 (*) 70 - 99 mg/dL  GLUCOSE, CAPILLARY     Status: Abnormal   Collection Time    09/22/13 12:00 PM      Result Value Range   Glucose-Capillary 147 (*) 70 - 99 mg/dL   Comment 1 Notify RN     Comment 2 Documented in Chart    GENTAMICIN LEVEL, RANDOM     Status: None   Collection Time    09/22/13  2:30 PM      Result Value Range   Gentamicin Rm <0.5     Comment: REPEATED TO VERIFY                Random Gentamicin therapeutic     range is dependent on dosage and     time of specimen collection.     A peak range is 5.0-10.0 ug/mL  A trough range is 0.5-2.0 ug/mL                Performed at [facility], ROUTINE W REFLEX MICROSCOPIC     Status: Abnormal   Collection Time    09/22/13  3:53 PM      Result Value Range   Color, Urine YELLOW  YELLOW   APPearance TURBID (*) CLEAR   Specific Gravity, Urine 1.015  1.005 - 1.030   pH 5.0  5.0 - 8.0   Glucose, UA NEGATIVE  NEGATIVE mg/dL   Hgb urine dipstick LARGE (*) NEGATIVE   Bilirubin Urine NEGATIVE  NEGATIVE   Ketones, ur NEGATIVE  NEGATIVE mg/dL   Protein, ur NEGATIVE  NEGATIVE mg/dL   Urobilinogen, UA 0.2  0.0 - 1.0 mg/dL   Nitrite NEGATIVE  NEGATIVE   Leukocytes, UA SMALL (*) NEGATIVE  SODIUM, URINE, RANDOM     Status: None   Collection Time    09/22/13  3:53 PM      Result Value Range   Sodium, Ur 43     Comment: Performed at [facility]  CREATININE, URINE, RANDOM     Status: None   Collection Time    09/22/13  3:53 PM      Result Value Range   Creatinine, Urine 155.4     Comment: Performed  at [facility]  URINE MICROSCOPIC-ADD ON     Status: Abnormal   Collection Time    09/22/13  3:53 PM      Result Value Range   Squamous Epithelial / LPF RARE  RARE   WBC, UA 7-10  <3 WBC/hpf   RBC / HPF TOO NUMEROUS TO COUNT  <3 RBC/hpf   Bacteria, UA FEW (*) RARE   Casts HYALINE CASTS (*) NEGATIVE   Urine-Other MANY YEAST    GLUCOSE, CAPILLARY     Status: Abnormal   Collection Time    09/22/13  4:42 PM      Result Value Range   Glucose-Capillary 123 (*) 70 - 99 mg/dL  GLUCOSE, CAPILLARY     Status: Abnormal   Collection Time    09/22/13  9:14 PM      Result Value Range   Glucose-Capillary 149 (*) 70 - 99 mg/dL  CBC     Status: Abnormal   Collection Time    09/23/13  4:11 AM      Result Value Range   WBC 8.5  4.0 - 10.5 K/uL   RBC 2.99 (*) 3.87 - 5.11 MIL/uL   Hemoglobin 8.8 (*) 12.0 - 15.0 g/dL   HCT 53.9 (*) 90.8 - 52.0 %   MCV 93.6  78.0 - 100.0 fL   MCH 29.4  26.0 - 34.0 pg   MCHC 31.4  30.0 - 36.0 g/dL   RDW 50.5 (*) 09.1 - 85.9 %   Platelets 258  150 - 400 K/uL  BASIC METABOLIC PANEL     Status: Abnormal   Collection Time    09/23/13  4:11 AM      Result Value Range   Sodium 131 (*) 137 - 147 mEq/L   Potassium 5.1  3.7 - 5.3 mEq/L   Chloride 94 (*) 96 - 112 mEq/L   CO2 26  19 - 32 mEq/L   Glucose, Bld 126 (*) 70 - 99 mg/dL   BUN 43 (*) 6 - 23 mg/dL   Creatinine, Ser 9.56 (*) 0.50 - 1.10 mg/dL   Calcium 7.1 (*)  8.4 - 10.5 mg/dL   GFR calc non Af Amer 19 (*) >90 mL/min   GFR calc Af Amer 22 (*) >90 mL/min   Comment: (NOTE)     The eGFR has been calculated using the CKD EPI equation.     This calculation has not been validated in all clinical situations.     eGFR's persistently <90 mL/min signify possible Chronic Kidney     Disease.  GLUCOSE, CAPILLARY     Status: None   Collection Time    09/23/13  7:33 AM      Result Value Range   Glucose-Capillary 81  70 - 99 mg/dL  GLUCOSE, CAPILLARY     Status: Abnormal   Collection Time    09/23/13 12:09  PM      Result Value Range   Glucose-Capillary 127 (*) 70 - 99 mg/dL   Comment 1 Notify RN    CK     Status: None   Collection Time    09/23/13  1:10 PM      Result Value Range   Total CK 111  7 - 177 U/L      Component Value Date/Time   SDES KNEE 09/14/2013 1627   SPECREQUEST NONE 09/14/2013 1627   CULT  Value: ABUNDANT METHICILLIN RESISTANT STAPHYLOCOCCUS AUREUS Note: RIFAMPIN AND GENTAMICIN SHOULD NOT BE USED AS SINGLE DRUGS FOR TREATMENT OF STAPH INFECTIONS. CRITICAL RESULT CALLED TO, READ BACK BY AND VERIFIED WITH: CAITLYN TAYLOR 09/16/13 @ 9:12AM BY RUSCOE A. Performed at [facility] 09/14/2013 1627   REPTSTATUS 09/16/2013 FINAL 09/14/2013 1627   Dg Chest Port 1 View  09/22/2013   CLINICAL DATA:  Postoperative from right knee surgery now with hypoxia  EXAM: PORTABLE CHEST - 1 VIEW  COMPARISON:  PA and lateral chest x-ray of September 19, 2013  FINDINGS: The lungs are reasonably well inflated. The interstitial markings are increased bilaterally. The cardiopericardial silhouette remains enlarged. The pulmonary vascularity appears more engorged. There is a right-sided PICC line in place with the tip in the region of the proximal SVC which is stable. No pleural effusion is evident.  IMPRESSION: The findings are worrisome for pulmonary interstitial edema which may be of cardiac or noncardiac cause. No discrete alveolar pneumonia is demonstrated.   Electronically Signed   By: David  Martinique   On: 09/22/2013 13:44     Recent Results (from the past 720 hour(s))  ANAEROBIC CULTURE     Status: None   Collection Time    09/13/13  8:00 PM      Result Value Range Status   Specimen Description KNEE RIGHT KNEE JOINT   Final   Special Requests [facility] ONLY   Final   Gram Stain     Final   Value: ABUNDANT WBC PRESENT,BOTH PMN AND MONONUCLEAR     NO SQUAMOUS EPITHELIAL CELLS SEEN     MODERATE GRAM POSITIVE COCCI     IN PAIRS IN CLUSTERS     Performed at [facility]   Culture     Final    Value: NO ANAEROBES ISOLATED     Performed at [facility]   Report Status 09/19/2013 FINAL   Final  BODY FLUID CULTURE     Status: None   Collection Time    09/14/13  4:27 PM      Result Value Range Status   Specimen Description KNEE   Final   Special Requests NONE   Final   Gram Stain  Final   Value: ABUNDANT WBC PRESENT,BOTH PMN AND MONONUCLEAR     MODERATE GRAM POSITIVE COCCI     IN PAIRS IN CLUSTERS Gram Stain Report Called to,Read Back By and Verified With: Gram Stain Report Called to,Read Back By and Verified With: DONNA A _0  ON 09/15/13 BY SMIAS     Performed at [facility]   Culture     Final   Value: ABUNDANT METHICILLIN RESISTANT STAPHYLOCOCCUS AUREUS     Note: RIFAMPIN AND GENTAMICIN SHOULD NOT BE USED AS SINGLE DRUGS FOR TREATMENT OF STAPH INFECTIONS. CRITICAL RESULT CALLED TO, READ BACK BY AND VERIFIED WITH: CAITLYN TAYLOR 09/16/13 @ 9:12AM BY RUSCOE A.     Performed at [facility]   Report Status 09/16/2013 FINAL   Final   Organism ID, Bacteria METHICILLIN RESISTANT STAPHYLOCOCCUS AUREUS   Final  SURGICAL PCR SCREEN     Status: Abnormal   Collection Time    09/16/13  4:47 PM      Result Value Range Status   MRSA, PCR POSITIVE (*) NEGATIVE Final   Comment: RESULT CALLED TO, READ BACK BY AND VERIFIED WITH:     K.BUTCHEE AT 1818 ON [id number] BY C.BONGEL   Staphylococcus aureus POSITIVE (*) NEGATIVE Final   Comment:            The Xpert SA Assay (FDA     approved for NASAL specimens     in patients over 61 years of age),     is one component of     a comprehensive surveillance     program.  Test performance has     been validated by [employer] for patients greater     than or equal to 6 year old.     It is not intended     to diagnose infection nor to     guide or monitor treatment.  MRSA PCR SCREENING     Status: Abnormal   Collection Time    09/17/13  7:33 AM      Result Value Range Status   MRSA by PCR POSITIVE (*) NEGATIVE  Final   Comment:            The GeneXpert MRSA Assay (FDA     approved for NASAL specimens     only), is one component of a     comprehensive MRSA colonization     surveillance program. It is not     intended to diagnose MRSA     infection nor to guide or     monitor treatment for     MRSA infections.     RESULT CALLED TO, READ BACK BY AND VERIFIED WITH:     E. 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 She also now has a rash on her back.  #1 methicillin-resistant staph aureus prosthetic knee infection:  --I. have changed over to IV daptomycin which will be renally adjusted --E. baseline CPK level.  --She will need to be treated for least 6 weeks with IV antibiotics.  #2 renal insufficiency thought possibly due to vancomycin. Again we are stopping vancomycin going to daptomycin.  #3 rash could be a contact dermatitis due to something in her limbs her bed it is due  to vancomycin again we are moving away from vancomycin and hopefully this drug will be removed from her system June.  #4 screening: We'll check for HIV and viral hepatitis panel.  I spent greater than 25 minutes with the patient including greater than 50% of time in face to face counsel of the patient and in coordination of their care.   Dr. Linus Salmons is here tomorrow.     Thank you so much for this interesting consult  [place] [facility] [place] [phone] (pager) [phone] (office) 09/23/2013, 3:51 PM  [place] 09/23/2013, 3:51 PM

## 2013-09-23 NOTE — Progress Notes (Signed)
Physical Therapy Treatment Patient Details Name: DANELY BAYLISS MRN: [id number] DOB: August 27, 1941 Today's Date: 09/23/2013 Time: 7628-3151 PT Time Calculation (min): 19 min  PT Assessment / Plan / Recommendation  History of Present Illness 73 y.o. female admitted with R knee pain. infected TKA.S/P Resection and antibiotic spacer. acute renal failure.   PT Comments   Pt continues  to really give her best effort in mobilizing. Continue  PT. Rash remains. RN in to inspect back side.  Follow Up Recommendations  SNF     Does the patient have the potential to tolerate intense rehabilitation     Barriers to Discharge        Equipment Recommendations  None recommended by PT    Recommendations for Other Services    Frequency Min 6X/week   Progress towards PT Goals Progress towards PT goals: Progressing toward goals  Plan Current plan remains appropriate    Precautions / Restrictions Precautions Precautions: Fall Precaution Comments: No ROM R knee until clarified by Dr. Alvan Dame. Required Braces or Orthoses: Knee Immobilizer - Right Knee Immobilizer - Right: On when out of bed or walking Restrictions Weight Bearing Restrictions: Yes RLE Weight Bearing: Partial weight bearing RLE Partial Weight Bearing Percentage or Pounds: 25%   Pertinent Vitals/Pain Clinking bothers her, not necessarily painful,    Mobility  Bed Mobility Overal bed mobility: Needs Assistance;+2 for physical assistance  Sit to supine: Max assist;+2 for physical assistance General bed mobility comments: support R leg , extra time to work through "clunking of knee" Transfers Overall transfer level: Needs assistance Equipment used: Rolling walker (2 wheeled) Transfers: Sit to/from [facility] Sit to Stand: +2 physical assistance;Mod assist Stand pivot transfers: +2 physical assistance;Mod assist General transfer comment: assist to rise, VCs hand placement, pt had difficulty taking a step with 25% WB,  pt scooted around on L foot, TDWB on Rleg.    Exercises     PT Diagnosis:    PT Problem List:   PT Treatment Interventions:     PT Goals (current goals can now be found in the care plan section)    Visit Information  Last PT Received On: 09/23/13 Assistance Needed: +2 History of Present Illness: 73 y.o. female admitted with R knee pain. infected TKA.S/P Resection and antibiotic spacer. acute renal failure.    Subjective Data      Cognition  Cognition Arousal/Alertness: Awake/alert    Balance     End of Session PT - End of Session Activity Tolerance: Patient limited by pain;Patient tolerated treatment well Patient left: in bed;with call bell/phone within reach Nurse Communication: Mobility status   GP     Claretha Cooper 09/23/2013, 4:35 PM

## 2013-09-23 NOTE — Progress Notes (Signed)
Physical Therapy Treatment Patient Details Name: Amy Burns MRN: [id number] DOB: [birth year] Today's Date: 09/23/2013 Time: 6195-0932 PT Time Calculation (min): 63 min  PT Assessment / Plan / Recommendation  History of Present Illness 73 y.o. female admitted with R knee pain. infected TKA.S/P Resection and antibiotic spacer. acute renal failure.   PT Comments   Pt found with significant rash on buttocks, arms, appeared foley catheter leaked in bed. RN notified. Pt continues to require 2 person lifting assistance. Recommend SNF  Pt is standing and pivoting with RKI in place.  Follow Up Recommendations  SNF     Does the patient have the potential to tolerate intense rehabilitation     Barriers to Discharge        Equipment Recommendations  None recommended by PT    Recommendations for Other Services    Frequency Min 6X/week   Progress towards PT Goals Progress towards PT goals: Progressing toward goals  Plan Current plan remains appropriate    Precautions / Restrictions Precautions Precautions: Fall Required Braces or Orthoses: Knee Immobilizer - Right Knee Immobilizer - Right: On when out of bed or walking Restrictions Weight Bearing Restrictions: Yes RLE Weight Bearing: Partial weight bearing RLE Partial Weight Bearing Percentage or Pounds: 25%   Pertinent Vitals/Pain Reports pain is mild.    Mobility  Bed Mobility Overal bed mobility: Needs Assistance;+2 for physical assistance Supine to sit: +2 for physical assistance;Mod assist;HOB elevated General bed mobility comments: support R leg , extra time to work through "clunking of knee" Transfers Overall transfer level: Needs assistance Equipment used: Rolling walker (2 wheeled) Transfers: Sit to/from [facility] Sit to Stand: +2 physical assistance;Mod assist Stand pivot transfers: +2 physical assistance;Mod assist General transfer comment: assist to rise, VCs hand placement, pt had difficulty  taking a step with 25% WB, pt scooted around on L foot.pt transferred from bed to L  to [facility], stood for recliner to brought up behind pt after BSC removed.    Exercises     PT Diagnosis:    PT Problem List:   PT Treatment Interventions:     PT Goals (current goals can now be found in the care plan section)    Visit Information  Last PT Received On: 09/23/13 Assistance Needed: +2 History of Present Illness: 73 y.o. female admitted with R knee pain. infected TKA.S/P Resection and antibiotic spacer. acute renal failure.    Subjective Data      Cognition  Cognition Arousal/Alertness: Awake/alert    Balance     End of Session PT - End of Session Activity Tolerance: Patient limited by pain;Patient tolerated treatment well Patient left: in chair;with call bell/phone within reach;with family/visitor present Nurse Communication: Mobility status (severe skin breakdown on buttocks, appeared catheter was leaking in bed.)   GP     Claretha Cooper 09/23/2013, 4:32 PM

## 2013-09-24 LAB — HEPATITIS PANEL, ACUTE
HCV Ab: NEGATIVE
HEP B C IGM: NONREACTIVE
Hep A IgM: NONREACTIVE
Hepatitis B Surface Ag: NEGATIVE

## 2013-09-24 LAB — CBC
HCT: 27.2 % — ABNORMAL LOW (ref 36.0–46.0)
HEMOGLOBIN: 8.6 g/dL — AB (ref 12.0–15.0)
MCH: 29.4 pg (ref 26.0–34.0)
MCHC: 31.6 g/dL (ref 30.0–36.0)
MCV: 92.8 fL (ref 78.0–100.0)
Platelets: 277 10*3/uL (ref 150–400)
RBC: 2.93 MIL/uL — ABNORMAL LOW (ref 3.87–5.11)
RDW: 15.8 % — ABNORMAL HIGH (ref 11.5–15.5)
WBC: 6.2 10*3/uL (ref 4.0–10.5)

## 2013-09-24 LAB — BASIC METABOLIC PANEL
BUN: 43 mg/dL — ABNORMAL HIGH (ref 6–23)
CALCIUM: 7.6 mg/dL — AB (ref 8.4–10.5)
CO2: 28 mEq/L (ref 19–32)
CREATININE: 2.01 mg/dL — AB (ref 0.50–1.10)
Chloride: 95 mEq/L — ABNORMAL LOW (ref 96–112)
GFR calc Af Amer: 27 mL/min — ABNORMAL LOW (ref >90)
GFR, EST NON AFRICAN AMERICAN: 23 mL/min — AB (ref >90)
GLUCOSE: 115 mg/dL — AB (ref 70–99)
Potassium: 4.9 mEq/L (ref 3.7–5.3)
SODIUM: 135 meq/L — AB (ref 137–147)

## 2013-09-24 LAB — GLUCOSE, CAPILLARY
GLUCOSE-CAPILLARY: 103 mg/dL — AB (ref 70–99)
GLUCOSE-CAPILLARY: 127 mg/dL — AB (ref 70–99)
Glucose-Capillary: 98 mg/dL (ref 70–99)

## 2013-09-24 LAB — SEDIMENTATION RATE: Sed Rate: 100 mm/hr — ABNORMAL HIGH (ref 0–22)

## 2013-09-24 LAB — C-REACTIVE PROTEIN: CRP: 16.9 mg/dL — AB (ref <0.60)

## 2013-09-24 MED ORDER — FUROSEMIDE 10 MG/ML IJ SOLN
40.0000 mg | Freq: Two times a day (BID) | INTRAMUSCULAR | Status: DC
Start: 2013-09-24 — End: 2013-09-26
  Administered 2013-09-24 – 2013-09-26 (×4): 40 mg via INTRAVENOUS
  Filled 2013-09-24 (×4): qty 4

## 2013-09-24 MED ORDER — SODIUM CHLORIDE 0.9 % IV SOLN
500.0000 mg | INTRAVENOUS | Status: DC
  Administered 2013-09-24 – 2013-09-25 (×2): 500 mg via INTRAVENOUS
  Filled 2013-09-24 (×3): qty 10

## 2013-09-24 NOTE — Progress Notes (Addendum)
Patient ID: Amy Burns, female   DOB: [birth year], 73 y.o.   MRN: [id number] Subjective: 3 Days Post-Op Procedure(s) (LRB): RIGHT TOTAL KNEE RESECTION WITH ANTIBIOTIC SPACER (Right)    Patient reports pain as mild to moderate.  Concerns with the way her leg feels so heavy - reviewed with her these concerns  Objective:   VITALS:   Filed Vitals:   09/24/13 1129  BP:   Pulse:   Temp:   Resp: 20    Neurovascular intact Incision: dressing C/D/I Dressing changed today  LABS  Recent Labs  09/22/13 0355 09/23/13 0411 09/24/13 0642  HGB 9.2* 8.8* 8.6*  HCT 28.8* 28.0* 27.2*  WBC 9.8 8.5 6.2  PLT 237 258 277     Recent Labs  09/22/13 0355 09/23/13 0411 09/24/13 0642  NA 133* 131* 135*  K 5.1 5.1 4.9  BUN 38* 43* 43*  CREATININE 2.55* 2.36* 2.01*  GLUCOSE 100* 126* 115*    No results found for this basename: LABPT, INR,  in the last 72 hours   Assessment/Plan: 3 Days Post-Op Procedure(s) (LRB): RIGHT TOTAL KNEE RESECTION WITH ANTIBIOTIC SPACER (Right)   Up with therapy Discharge to SNF for continued efforts at maximizing her strengthening at work towards independence for eventual return to home  On IV antibiotics per ID recs for 6 weeks PIC line placed

## 2013-09-24 NOTE — Discharge Instructions (Signed)
Keep wound dry until follow up  IV antibiotics per Infectious Disease recs for 6 weeks  PWB permitted on right lower extremity with activity, knee immobilizer for comfort with activity as needed

## 2013-09-24 NOTE — Progress Notes (Signed)
[facility] PROGRESS NOTE  Amy Burns GQQ:[id number] DOB: 10-20-1940 DOA: 09/13/2013 PCP: Eulas Post, MD  Brief narrative: 73 year old female with history of bilateral knee replacement by Dr. Gladstone Lighter several years ago now presents to [facility] ED 09/13/2013 with main concern of one week duration of progressively worsening right knee pain, throbbing and constant, radiating to the right ankle, 10/10 in severity, associated with erythema and swelling, worse with ambulation and somewhat improved with rest, otherwise with no specific alleviating factors, no similar events in the past.  After admission she was found to be volume overloaded and diuresed with IV lasix Underwent R knee arthroplasty 1/16  Assessment and Plan:   Principal Problem:  MRSA R knee septic arthritis - status post right knee joint aspiration 09/13/2013  - joint fluid positive for MRSA;  - On vancomycin, s/p 10days , dose held due to toxicity,  - now on Daptomycin per ID, needs 6 weeks - pain control, PT - s/p OR 1/17, Abx spacer placed  Acute Diastolic CHF -volume status improved,  -4L negative in 2days, will cut down lasix  -appreciate dr.Schertz consult -she was volume overloaded and diuresed pre op too -cath 2009 normal -ECHO done normal EF  Acute renal failure  - due to Vanc Toxicity -improving -stopped VAnc dosing per pharmacy -improved with aggressive diuresis, cut down lasix to 40mg  q12 -Appreciate Dr.Schertz consult  Rash -drug rash vs contact dermatitis  Anemia of chronic disease  - Hg stable   Diabetes mellitus  - continue to hold Metformin due to ARF  - pt on SSI for now  - A1C 6.5 (09/13/2012)   Hpothyroidism  - continue synthroid   HTN  - continue to hold Lisinopril due to renal insufficiency (even though Cr improved at this time) and pending OR -continue hydralazine PRN   Consultants:  Ortho  Procedures/Studies:  R knee arthocentesis PROCEDURE: Resection of right total knee  arthroplasty, placement of  antibiotic spacer, per Dr.Olin 1/16   Antibiotics:  Vancomycin 01/08 -->  Maxipime 01/08 --> 01/11  Code Status: Full  DVT prophylaxis: Lovenox on hold for OR Family Communication: Pt and husband at bedside  Disposition Plan: SNF recommended, SW assisting D/C plan  HPI/Subjective: No events overnight, breathing ok, some sore thorat   Objective: Filed Vitals:   09/24/13 0659 09/24/13 0711 09/24/13 0800 09/24/13 0843  BP: 133/49     Pulse: 88     Temp: 97.9 F (36.6 C)     TempSrc: Oral     Resp: 20  20   Height:      Weight: 141.6 kg (312 lb 2.7 oz) 136.1 kg (300 lb 0.7 oz)    SpO2: 99%  88% 88%    Intake/Output Summary (Last 24 hours) at 09/24/13 1129 Last data filed at 09/24/13 1103  Gross per 24 hour  Intake    520 ml  Output   3400 ml  Net  -2880 ml   Exam:  General: Pt is alert, follows commands appropriately, not in acute distress  Cardiovascular: Regular rate and rhythm, S1/S2, no murmurs, no rubs, no gallops  HEENt: no thrush, mild pharyngeal erythema Respiratory: Clear to auscultation bilaterally, diminished at bases Abdomen: Soft, non tender, non distended, bowel sounds present, no guarding  Extremities: R knee with erythema, swelling, 1plus LE pitting edema, pulses DP and PT palpable bilaterally  Neuro: Grossly nonfocal   Data Reviewed: Basic Metabolic Panel:  Recent Labs Lab 09/20/13 0550 09/21/13 0520 09/22/13 0355 09/23/13 0411 09/24/13  [id number]  NA 134* 137 133* 131* 135*  K 4.3 4.6 5.1 5.1 4.9  CL 96 98 95* 94* 95*  CO2 29 29 28 26 28   GLUCOSE 121* 120* 100* 126* 115*  BUN 25* 31* 38* 43* 43*  CREATININE 1.05 1.37* 2.55* 2.36* 2.01*  CALCIUM 8.0* 7.6* 7.3* 7.1* 7.6*   CBC:  Recent Labs Lab 09/20/13 0610 09/21/13 0520 09/22/13 0355 09/23/13 0411 09/24/13 0642  WBC 9.4 8.8 9.8 8.5 6.2  HGB 9.9* 10.1* 9.2* 8.8* 8.6*  HCT 31.2* 30.8* 28.8* 28.0* 27.2*  MCV 91.2 90.6 92.9 93.6 92.8  PLT 218 228 237 258  277    CBG:  Recent Labs Lab 09/23/13 0733 09/23/13 1209 09/23/13 1736 09/23/13 2348 09/24/13 0729  GLUCAP 81 127* 139* 127* 98    Recent Results (from the past 240 hour(s))  ANAEROBIC CULTURE     Status: None   Collection Time    09/13/13  8:00 PM      Result Value Range Status   Specimen Description KNEE RIGHT KNEE JOINT   Final   Special Requests ANAC ONLY   Final   Gram Stain     Final   Value: ABUNDANT WBC PRESENT,BOTH PMN AND MONONUCLEAR     NO SQUAMOUS EPITHELIAL CELLS SEEN     MODERATE GRAM POSITIVE COCCI     IN PAIRS IN CLUSTERS     Performed at [facility]   Culture     Final   Value: NO ANAEROBES ISOLATED     Performed at [facility]   Report Status 09/19/2013 FINAL   Final  BODY FLUID CULTURE     Status: None   Collection Time    09/14/13  4:27 PM      Result Value Range Status   Specimen Description KNEE   Final   Special Requests NONE   Final   Gram Stain     Final   Value: ABUNDANT WBC PRESENT,BOTH PMN AND MONONUCLEAR     MODERATE GRAM POSITIVE COCCI     IN PAIRS IN CLUSTERS Gram Stain Report Called to,Read Back By and Verified With: Gram Stain Report Called to,Read Back By and Verified With: DONNA A @1131  ON 09/15/13 BY SMIAS     Performed at [facility]   Culture     Final   Value: ABUNDANT METHICILLIN RESISTANT STAPHYLOCOCCUS AUREUS     Note: RIFAMPIN AND GENTAMICIN SHOULD NOT BE USED AS SINGLE DRUGS FOR TREATMENT OF STAPH INFECTIONS. CRITICAL RESULT CALLED TO, READ BACK BY AND VERIFIED WITH: CAITLYN TAYLOR 09/16/13 @ 9:12AM BY RUSCOE A.     Performed at [facility]   Report Status 09/16/2013 FINAL   Final   Organism ID, Bacteria METHICILLIN RESISTANT STAPHYLOCOCCUS AUREUS   Final  SURGICAL PCR SCREEN     Status: Abnormal   Collection Time    09/16/13  4:47 PM      Result Value Range Status   MRSA, PCR POSITIVE (*) NEGATIVE Final   Comment: RESULT CALLED TO, READ BACK BY AND VERIFIED WITH:     K.BUTCHEE AT 1818  ON [id number] BY C.BONGEL   Staphylococcus aureus POSITIVE (*) NEGATIVE Final  MRSA PCR SCREENING     Status: Abnormal   Collection Time    09/17/13  7:33 AM      Result Value Range Status   MRSA by PCR POSITIVE (*) NEGATIVE Final     Scheduled Meds: . aspirin EC  325 mg Oral  BID  . DAPTOmycin (CUBICIN)  IV  500 mg Intravenous Q48H  . ferrous sulfate  325 mg Oral TID PC  . furosemide  80 mg Intravenous Q12H  . insulin aspart  0-9 Units Subcutaneous TID WC  . levothyroxine  125 mcg Oral QAC breakfast  . polyethylene glycol  17 g Oral Daily  . sodium chloride  10 mL Intravenous Q12H   Continuous Infusions:     Domenic Polite, MD  [facility] Pager [phone]  If 7PM-7AM, please contact night-coverage [email or web address] Password [email or web address] 09/24/2013, 11:29 AM   LOS: 11 days

## 2013-09-24 NOTE — Progress Notes (Signed)
Patient ID: Amy Burns, female   DOB: 1941-01-18, 73 y.o.   MRN: [id number] S:c/o right knee and ankle pain O:BP 133/49  Pulse 88  Temp(Src) 97.9 F (36.6 C) (Oral)  Resp 20  Ht 5\' 2"  (1.575 m)  Wt 136.1 kg (300 lb 0.7 oz)  BMI 54.87 kg/m2  SpO2 88%  Intake/Output Summary (Last 24 hours) at 09/24/13 1213 Last data filed at 09/24/13 1103  Gross per 24 hour  Intake    520 ml  Output   3400 ml  Net  -2880 ml   Intake/Output: I/O last 3 completed shifts: In: 710 [P.O.:600; IV Piggyback:110] Out: 3750 [Urine:3750]  Intake/Output this shift:  Total I/O In: 410 [P.O.:400; I.V.:10] Out: 450 [Urine:450] Weight change: 1.5 kg (3 lb 4.9 oz) Gen:WD obese WF lying in bed in NAD CVS:no rub Resp:cta GLO:VFIEP, +BS,NT Ext:1+ pretib edema, R knee bandaged.   Recent Labs Lab 09/18/13 0410 09/19/13 0545 09/20/13 0550 09/21/13 0520 09/22/13 0355 09/23/13 0411 09/24/13 0642  NA 138 139 134* 137 133* 131* 135*  K 4.4 4.5 4.3 4.6 5.1 5.1 4.9  CL 100 100 96 98 95* 94* 95*  CO2 29 30 29 29 28 26 28   GLUCOSE 128* 119* 121* 120* 100* 126* 115*  BUN 24* 24* 25* 31* 38* 43* 43*  CREATININE 0.83 0.95 1.05 1.37* 2.55* 2.36* 2.01*  CALCIUM 8.3* 8.0* 8.0* 7.6* 7.3* 7.1* 7.6*   Liver Function Tests: No results found for this basename: AST, ALT, ALKPHOS, BILITOT, PROT, ALBUMIN,  in the last 168 hours No results found for this basename: LIPASE, AMYLASE,  in the last 168 hours No results found for this basename: AMMONIA,  in the last 168 hours CBC:  Recent Labs Lab 09/20/13 0610 09/21/13 0520 09/22/13 0355 09/23/13 0411 09/24/13 0642  WBC 9.4 8.8 9.8 8.5 6.2  HGB 9.9* 10.1* 9.2* 8.8* 8.6*  HCT 31.2* 30.8* 28.8* 28.0* 27.2*  MCV 91.2 90.6 92.9 93.6 92.8  PLT 218 228 237 258 277   Cardiac Enzymes:  Recent Labs Lab 09/23/13 1310  CKTOTAL 111   CBG:  Recent Labs Lab 09/23/13 1209 09/23/13 1736 09/23/13 2348 09/24/13 0729 09/24/13 1143  GLUCAP 127* 139* 127* 98 103*     Iron Studies: No results found for this basename: IRON, TIBC, TRANSFERRIN, FERRITIN,  in the last 72 hours Studies/Results: No results found. Marland Kitchen aspirin EC  325 mg Oral BID  . DAPTOmycin (CUBICIN)  IV  500 mg Intravenous Q48H  . ferrous sulfate  325 mg Oral TID PC  . furosemide  80 mg Intravenous Q12H  . insulin aspart  0-9 Units Subcutaneous TID WC  . levothyroxine  125 mcg Oral QAC breakfast  . polyethylene glycol  17 g Oral Daily  . sodium chloride  10 mL Intravenous Q12H    BMET    Component Value Date/Time   NA 135* 09/24/2013 0642   K 4.9 09/24/2013 0642   CL 95* 09/24/2013 0642   CO2 28 09/24/2013 0642   GLUCOSE 115* 09/24/2013 0642   BUN 43* 09/24/2013 0642   CREATININE 2.01* 09/24/2013 0642   CALCIUM 7.6* 09/24/2013 0642   GFRNONAA 23* 09/24/2013 0642   GFRAA 27* 09/24/2013 0642   CBC    Component Value Date/Time   WBC 6.2 09/24/2013 0642   WBC 4.8 07/22/2011 1400   RBC 2.93* 09/24/2013 0642   RBC 4.05 07/22/2011 1400   HGB 8.6* 09/24/2013 0642   HGB 13.1 07/22/2011 1400   HCT 27.2*  09/24/2013 0642   HCT 38.6 07/22/2011 1400   PLT 277 09/24/2013 0642   PLT 153 07/22/2011 1400   MCV 92.8 09/24/2013 0642   MCV 95.3 07/22/2011 1400   MCH 29.4 09/24/2013 0642   MCH 32.2 07/22/2011 1400   MCHC 31.6 09/24/2013 0642   MCHC 33.8 07/22/2011 1400   RDW 15.8* 09/24/2013 0642   RDW 13.6 07/22/2011 1400   LYMPHSABS 0.8 09/13/2013 1230   LYMPHSABS 1.2 07/22/2011 1400   MONOABS 1.4* 09/13/2013 1230   MONOABS 0.3 07/22/2011 1400   EOSABS 0.0 09/13/2013 1230   EOSABS 0.2 07/22/2011 1400   BASOSABS 0.0 09/13/2013 1230   BASOSABS 0.0 07/22/2011 1400     Assess:  1. AKI from vanc toxicity; vanc d/c'd, Scr trending down.  Good UOP.  Now on cubicin per ID. Cont to follow UOP and daily Scr.   2. Septic prosthetic joint, s/p removal +MRSA with antibiotic spacer. 3. Pulm edema d/t vol excess- improving with diuresis. UOP 3.4L, will decrease Lasix dose and follow. 4. HTN- no meds now,  stable 5. ABLA- follow H/H and transfuse prn 6. Pyuria/funguria- >100,000 cfu of yeast.  Antifungals per ID 7. Rash- ?drug eruption.  Off of vanco and maxipime.  [place] A

## 2013-09-24 NOTE — Progress Notes (Signed)
Physical Therapy Treatment Patient Details Name: Amy Burns MRN: [id number] DOB: 1941/05/04 Today's Date: 09/24/2013 Time: 3825-0539 PT Time Calculation (min): 14 min  PT Assessment / Plan / Recommendation  History of Present Illness 73 y.o. female admitted with R knee pain. infected TKA.S/P Resection and antibiotic spacer. acute renal failure.   PT Comments   Pt was OOB earlier and now back in bed so performed R LE TE's with no knee ROM  Follow Up Recommendations  SNF     Does the patient have the potential to tolerate intense rehabilitation     Barriers to Discharge        Equipment Recommendations  None recommended by PT    Recommendations for Other Services    Frequency Min 6X/week   Progress towards PT Goals Progress towards PT goals: Progressing toward goals  Plan      Precautions / Restrictions Precautions Precautions: Fall Precaution Comments: No ROM R knee until clarified by Dr. Alvan Dame. Required Braces or Orthoses: Knee Immobilizer - Right Knee Immobilizer - Right: On when out of bed or walking Restrictions Weight Bearing Restrictions: Yes RLE Weight Bearing: Partial weight bearing RLE Partial Weight Bearing Percentage or Pounds: 25    Pertinent Vitals/Pain C/o 5/10 pain           Exercises Total Knee Replacement TE's 10 reps B LE ankle pumps 10 reps knee presses 10 reps SLR's AAROM 10 reps ABD AAROM 10 reps towel squeezes     PT Goals (current goals can now be found in the care plan section)    Visit Information  Last PT Received On: 09/24/13 Assistance Needed: +2 History of Present Illness: 73 y.o. female admitted with R knee pain. infected TKA.S/P Resection and antibiotic spacer. acute renal failure.    Subjective Data      Cognition       Balance     End of Session PT - End of Session Activity Tolerance: Patient limited by pain;Patient tolerated treatment well Patient left: in bed;with call bell/phone within reach   Rica Koyanagi   PTA [facility]  Acute  Rehab Pager      [phone]

## 2013-09-24 NOTE — Progress Notes (Signed)
ANTIBIOTIC CONSULT NOTE - Follow up  Pharmacy Consult for Daptomycin  Indication: PJI  Allergies  Allergen Reactions  . Meloxicam     REACTION: itiching    Patient Measurements: Height: 5\' 2"  (157.5 cm) Weight: 300 lb 0.7 oz (136.1 kg) IBW/kg (Calculated) : 50.1 Adjusted Body Weight: 86kg  Vital Signs: Temp: 97.9 F (36.6 C) (01/19 1333) Temp src: Oral (01/19 1333) BP: 146/45 mmHg (01/19 1333) Pulse Rate: 96 (01/19 1333) Intake/Output from previous day: 01/18 0701 - 01/19 0700 In: 470 [P.O.:360; IV Piggyback:110] Out: 2950 [Urine:2950] Intake/Output from this shift: Total I/O In: 770 [P.O.:760; I.V.:10] Out: 1350 [Urine:1350]  Labs:  Recent Labs  09/22/13 0355 09/22/13 1553 09/23/13 0411 09/24/13 0642  WBC 9.8  --  8.5 6.2  HGB 9.2*  --  8.8* 8.6*  PLT 237  --  258 277  LABCREA  --  155.4  --   --   CREATININE 2.55*  --  2.36* 2.01*   Estimated Creatinine Clearance: 33.3 ml/min (by C-G formula based on Cr of 2.01).  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  Underwent R knee arthroplasty 1/16 with antibiotic spacer placement.  Pt initially started on vancomycin, then developed AKI.  Vancomycin stopped and Daptomycin ordered by ID.    1/8 >> Vanc >> 1/17 (last dose given 1/16) 1/8 >> Cefepime >> 1/11 1/18 >> Daptomycin >>  Tm24h: afebrile WBC: continues wnl Renal: SCr improved to 2.01, CrCl ~ 33 ml/min CG   Goal of Therapy:  Eradication of infection  Plan:   Daptomycin 6mg /kg q 24 hours.  Follow up renal function and cultures as available.  Follow up CK levels.  Gretta Arab PharmD, BCPS Pager [phone] 09/24/2013 3:04 PM

## 2013-09-25 DIAGNOSIS — R21 Rash and other nonspecific skin eruption: Secondary | ICD-10-CM | POA: Diagnosis not present

## 2013-09-25 DIAGNOSIS — T8453XA Infection and inflammatory reaction due to internal right knee prosthesis, initial encounter: Secondary | ICD-10-CM | POA: Diagnosis present

## 2013-09-25 DIAGNOSIS — I5033 Acute on chronic diastolic (congestive) heart failure: Secondary | ICD-10-CM

## 2013-09-25 DIAGNOSIS — N179 Acute kidney failure, unspecified: Secondary | ICD-10-CM | POA: Diagnosis not present

## 2013-09-25 DIAGNOSIS — I509 Heart failure, unspecified: Secondary | ICD-10-CM

## 2013-09-25 LAB — RENAL FUNCTION PANEL
ALBUMIN: 1.6 g/dL — AB (ref 3.5–5.2)
BUN: 38 mg/dL — ABNORMAL HIGH (ref 6–23)
CALCIUM: 7.9 mg/dL — AB (ref 8.4–10.5)
CO2: 30 mEq/L (ref 19–32)
CREATININE: 1.7 mg/dL — AB (ref 0.50–1.10)
Chloride: 99 mEq/L (ref 96–112)
GFR calc non Af Amer: 29 mL/min — ABNORMAL LOW (ref >90)
GFR, EST AFRICAN AMERICAN: 33 mL/min — AB (ref >90)
GLUCOSE: 111 mg/dL — AB (ref 70–99)
PHOSPHORUS: 4.3 mg/dL (ref 2.3–4.6)
POTASSIUM: 5.1 meq/L (ref 3.7–5.3)
Sodium: 139 mEq/L (ref 137–147)

## 2013-09-25 LAB — CBC
HEMATOCRIT: 27.1 % — AB (ref 36.0–46.0)
HEMOGLOBIN: 8.8 g/dL — AB (ref 12.0–15.0)
MCH: 30 pg (ref 26.0–34.0)
MCHC: 32.5 g/dL (ref 30.0–36.0)
MCV: 92.5 fL (ref 78.0–100.0)
Platelets: 257 10*3/uL (ref 150–400)
RBC: 2.93 MIL/uL — ABNORMAL LOW (ref 3.87–5.11)
RDW: 15.7 % — AB (ref 11.5–15.5)
WBC: 5.8 10*3/uL (ref 4.0–10.5)

## 2013-09-25 LAB — HIV-1 RNA QUANT-NO REFLEX-BLD
HIV 1 RNA Quant: <20 copies/mL (ref <20)
HIV-1 RNA Quant, Log: <1.3 {Log} (ref <1.30)

## 2013-09-25 LAB — GLUCOSE, CAPILLARY
GLUCOSE-CAPILLARY: 139 mg/dL — AB (ref 70–99)
GLUCOSE-CAPILLARY: 101 mg/dL — AB (ref 70–99)
GLUCOSE-CAPILLARY: 125 mg/dL — AB (ref 70–99)

## 2013-09-25 MED ORDER — ENOXAPARIN SODIUM 40 MG/0.4ML ~~LOC~~ SOLN: 40.0000 mg | SUBCUTANEOUS | Status: AC

## 2013-09-25 MED ORDER — SODIUM CHLORIDE 0.9 % IV SOLN: 500.0000 mg | INTRAVENOUS | Status: AC

## 2013-09-25 MED ORDER — FUROSEMIDE 40 MG PO TABS: 40.0000 mg | ORAL_TABLET | Freq: Two times a day (BID) | ORAL | Status: AC

## 2013-09-25 NOTE — Progress Notes (Signed)
OT Cancellation Note  Patient Details Name: ARELIA VOLPE MRN: [id number] DOB: 07/04/1941   Cancelled Treatment:    Reason Eval/Treat Not Completed: Other (comment) Pt states she just returned to bed from being up with nursing to bedside commode. She declines getting up right now and states she has done much of her ADL this am already. She requests OT try back another time.  Jules Schick [phone] 09/25/2013, 11:03 AM

## 2013-09-25 NOTE — Progress Notes (Signed)
Patient ID: [name], female   DOB: 03/20/1941, 73 y.o.   MRN: [id number] S:still c/o itching O:BP 131/43  Pulse 98  Temp(Src) 98.2 F (36.8 C) (Oral)  Resp 18  Ht 5\' 2"  (1.575 m)  Wt 134.7 kg (296 lb 15.4 oz)  BMI 54.30 kg/m2  SpO2 100%  Intake/Output Summary (Last 24 hours) at 09/25/13 1105 Last data filed at 09/25/13 0844  Gross per 24 hour  Intake   1190 ml  Output   1900 ml  Net   -710 ml   Intake/Output: I/O last 3 completed shifts: In: 3267 [P.O.:1120; I.V.:10; IV Piggyback:110] Out: 3625 [Urine:3625]  Intake/Output this shift:  Total I/O In: 360 [P.O.:360] Out: -  Weight change: -5.5 kg (-12 lb 2 oz) Gen:WD obese WF in NAd CVS:no rub Resp:cta TIW:PYKDX, NT IPJ:ASNKN - 1+ pretib edema   Recent Labs Lab 09/19/13 0545 09/20/13 0550 09/21/13 0520 09/22/13 0355 09/23/13 0411 09/24/13 0642 09/25/13 0430  NA 139 134* 137 133* 131* 135* 139  K 4.5 4.3 4.6 5.1 5.1 4.9 5.1  CL 100 96 98 95* 94* 95* 99  CO2 30 29 29 28 26 28 30   GLUCOSE 119* 121* 120* 100* 126* 115* 111*  BUN 24* 25* 31* 38* 43* 43* 38*  CREATININE 0.95 1.05 1.37* 2.55* 2.36* 2.01* 1.70*  ALBUMIN  --   --   --   --   --   --  1.6*  CALCIUM 8.0* 8.0* 7.6* 7.3* 7.1* 7.6* 7.9*  PHOS  --   --   --   --   --   --  4.3   Liver Function Tests:  Recent Labs Lab 09/25/13 0430  ALBUMIN 1.6*   No results found for this basename: LIPASE, AMYLASE,  in the last 168 hours No results found for this basename: AMMONIA,  in the last 168 hours CBC:  Recent Labs Lab 09/21/13 0520 09/22/13 0355 09/23/13 0411 09/24/13 0642 09/25/13 0430  WBC 8.8 9.8 8.5 6.2 5.8  HGB 10.1* 9.2* 8.8* 8.6* 8.8*  HCT 30.8* 28.8* 28.0* 27.2* 27.1*  MCV 90.6 92.9 93.6 92.8 92.5  PLT 228 237 258 277 257   Cardiac Enzymes:  Recent Labs Lab 09/23/13 1310  CKTOTAL 111   CBG:  Recent Labs Lab 09/24/13 0729 09/24/13 1143 09/24/13 1655 09/24/13 2120 09/25/13 0719  GLUCAP 98 103* 127* 139* 101*    Iron  Studies: No results found for this basename: IRON, TIBC, TRANSFERRIN, FERRITIN,  in the last 72 hours Studies/Results: No results found. Marland Kitchen aspirin EC  325 mg Oral BID  . DAPTOmycin (CUBICIN)  IV  500 mg Intravenous Q24H  . ferrous sulfate  325 mg Oral TID PC  . furosemide  40 mg Intravenous Q12H  . insulin aspart  0-9 Units Subcutaneous TID WC  . levothyroxine  125 mcg Oral QAC breakfast  . polyethylene glycol  17 g Oral Daily  . sodium chloride  10 mL Intravenous Q12H    BMET    Component Value Date/Time   NA 139 09/25/2013 0430   K 5.1 09/25/2013 0430   CL 99 09/25/2013 0430   CO2 30 09/25/2013 0430   GLUCOSE 111* 09/25/2013 0430   BUN 38* 09/25/2013 0430   CREATININE 1.70* 09/25/2013 0430   CALCIUM 7.9* 09/25/2013 0430   GFRNONAA 29* 09/25/2013 0430   GFRAA 33* 09/25/2013 0430   CBC    Component Value Date/Time   WBC 5.8 09/25/2013 0430   WBC 4.8 07/22/2011 1400  RBC 2.93* 09/25/2013 0430   RBC 4.05 07/22/2011 1400   HGB 8.8* 09/25/2013 0430   HGB 13.1 07/22/2011 1400   HCT 27.1* 09/25/2013 0430   HCT 38.6 07/22/2011 1400   PLT 257 09/25/2013 0430   PLT 153 07/22/2011 1400   MCV 92.5 09/25/2013 0430   MCV 95.3 07/22/2011 1400   MCH 30.0 09/25/2013 0430   MCH 32.2 07/22/2011 1400   MCHC 32.5 09/25/2013 0430   MCHC 33.8 07/22/2011 1400   RDW 15.7* 09/25/2013 0430   RDW 13.6 07/22/2011 1400   LYMPHSABS 0.8 09/13/2013 1230   LYMPHSABS 1.2 07/22/2011 1400   MONOABS 1.4* 09/13/2013 1230   MONOABS 0.3 07/22/2011 1400   EOSABS 0.0 09/13/2013 1230   EOSABS 0.2 07/22/2011 1400   BASOSABS 0.0 09/13/2013 1230   BASOSABS 0.0 07/22/2011 1400     Assess:  1. AKI from vanc toxicity; vanc d/c'd, Scr continues to trend down.  1. Good UOP even with lower dose of lasix.  2. Now on cubicin per ID.  3. Cont to follow UOP and daily Scr and renal dose meds for now.  2. Septic prosthetic joint, s/p removal +MRSA with antibiotic spacer. 3. Pulm edema d/t vol excess- improving with diuresis. 1. Good  UOP even with decreased Lasix dose 2. Continue with 40mg  bid and follow. 4. HTN- no meds now, stable 5. ABLA- follow H/H and transfuse prn 6. Pyuria/funguria- >100,000 cfu of yeast. No Antifungals per ID 7. Rash- ?drug eruption. Off of vanco and maxipime. 8. Dispo- per primary svc.  Will sign off as nothing else to offer at this time, please call with questions or concerns. [place] A

## 2013-09-25 NOTE — Discharge Summary (Signed)
Physician Discharge Summary  Amy Burns GXQ:[id number] DOB: December 15, 1940 DOA: 09/13/2013  PCP: Eulas Post, MD  Admit date: 09/13/2013 Discharge date: 09/25/2013  Time spent: 33mn minutes  Recommendations for Outpatient Follow-up:  1. Dr.Olin in 10days 2. Stop Daptomycin 2/27, Dc PICC pine after this 3. Check weekly CBC, CMP, CK while on Daptomycin [facility] [place] 4. FU with Dr.Comer infectious disease in 2 weeks  Discharge Diagnoses:  Principal Problem:   Septic arthritis of knee Active Problems:   HYPOTHYROIDISM   Morbid obesity   HYPERTENSION   Diastolic CHF, acute on chronic   Acute renal failure (ARF)   Rash and nonspecific skin eruption   Discharge Condition: improved  Diet recommendation: low sodium  Filed Weights   09/24/13 0659 09/24/13 0711 09/25/13 0620  Weight: 141.6 kg (312 lb 2.7 oz) 136.1 kg (300 lb 0.7 oz) 134.7 kg (296 lb 15.4 oz)    History of present illness:  73year old female with history of bilateral knee replacement by Dr. GGladstone Lighterseveral years ago [facility] concern of one week duration of progressively worsening right knee pain, throbbing and constant, radiating to the right ankle, 10/10 in severity, associated with erythema and swelling, worse with ambulation and somewhat improved with rest, otherwise with no specific alleviating factors, no similar events in the past. Pt denies fevers, chills, no numbness or tingling, no specific trauma to the area. In Ed, pt in mild distress due to pain  Hospital Course:  Brief narrative:  73year old female with history of bilateral knee replacement by Dr. GGladstone Lighterseveral years ago [facility] with main concern of one week duration of progressively worsening right knee pain, throbbing and constant, radiating to the right ankle, 10/10 in severity, associated with erythema and swelling, worse with ambulation and somewhat  improved with rest, otherwise with no specific alleviating factors, no similar events in the past.  After admission she was found to be volume overloaded and diuresed with IV lasix  Underwent R knee arthroplasty 1/16.  Subsequently had acute renal failure from vancomycin toxicity, but we stopped the vancomycin and she was started on IV daptomycin per infectious disease.  She is also being followed by nephrology and her creatinine is slowly improving at this point.  Assessment and Plan:  Principal Problem:  MRSA R knee septic arthritis  - status post right knee joint aspiration 09/13/2013  - joint fluid positive for MRSA;  - Was On vancomycin, s/p 10days , dose then held due to toxicity,  - now on Daptomycin per ID, needs 6 weeks of this till 2/27  -needs weekly CBC, Cmet, CK faxed to ID clinic while on Daptmycin - pain control, PT following SNF recommended for rehab - s/p OR 1/17, Abx spacer placed  -needs rehab , ORtho recommends 25% weight bearing R knee  Acute Diastolic CHF  -volume status improving  -5L negative in 3days, stopping IV lasix today and transitioning to PO lasix 420mBID  -greatly appreciate Renal consult per dr.Schertz  -she was volume overloaded and diuresed pre op too  -cath 2009 normal  -ECHO done normal EF   Acute renal failure  - due to Vanc Toxicity  -improving, peak creatinine was 2.5 now 1.7 -stopped VAnc dosing per pharmacy  -was also volume overloaded and improved with aggressive diuresis, change lasix to PO today  -FU bmet in am  -followed by Renal  Rash  -drug rash vs contact dermatitis  -improving   Anemia of chronic disease  - Hg stable   Diabetes mellitus  - continue to hold Metformin due to ARF  - pt on SSI for now  - A1C 6.5 (09/13/2012)   Hpothyroidism  - continue synthroid   HTN  - continue to hold Lisinopril due to renal insufficiency  -Bp stable  Consultants:  Ortho  ID Dr.Comer Renal, Dr.Schertz  Procedures/Studies:  R  knee arthocentesis PROCEDURE: Resection of right total knee arthroplasty, placement of  antibiotic spacer, per Dr.Olin 1/16      Discharge Exam: Filed Vitals:   09/25/13 1600  BP: 135/48  Pulse: 96  Temp: 98.3 F (36.8 C)  Resp: 18    General: AAOx3 Cardiovascular: S1S2/RRR Respiratory: DIminished at bases  Discharge Instructions       Future Appointments Provider Department Dept Phone   10/29/2013 1:15 PM Eulas Post, MD [place] [facility] [place]       Medication List    STOP taking these medications       lisinopril 40 MG tablet  Commonly known as:  PRINIVIL,ZESTRIL     metFORMIN 1000 MG tablet  Commonly known as:  GLUCOPHAGE      TAKE these medications       acetaminophen 500 MG tablet  Commonly known as:  TYLENOL  Two tabs in the AM and as needed in the evening     aspirin 81 MG tablet  Take 81 mg by mouth daily.     enoxaparin 40 MG/0.4ML injection  Commonly known as:  LOVENOX  Inject 0.4 mLs (40 mg total) into the skin daily. For 3 weeks     furosemide 40 MG tablet  Commonly known as:  LASIX  Take 1 tablet (40 mg total) by mouth 2 (two) times daily.     HYDROcodone-acetaminophen 5-325 MG per tablet  Commonly known as:  NORCO/VICODIN  Take 1 tablet by mouth every 6 (six) hours as needed for moderate pain.     levothyroxine 125 MCG tablet  Commonly known as:  SYNTHROID, LEVOTHROID  Take 125 mcg by mouth daily before breakfast.     sodium chloride 0.9 % SOLN 100 mL with DAPTOmycin 500 MG SOLR 500 mg  Inject 500 mg into the vein daily. Till 2/27       Allergies  Allergen Reactions  . Meloxicam     REACTION: itiching   Follow-up Information   Follow up with Mauri Pole, MD. Schedule an appointment as soon as possible for a visit in 2 weeks.   Specialty:  Orthopedic Surgery   Contact information:   [address] [place] [address] [phone]        The results of significant  diagnostics from this hospitalization (including imaging, microbiology, ancillary and laboratory) are listed below for reference.    Significant Diagnostic Studies: Dg Chest 2 View  09/19/2013   CLINICAL DATA:  Shortness of breath and weakness .  EXAM: CHEST  2 VIEW  COMPARISON:  09/15/2013.  FINDINGS: PICC line in good anatomic position. Cardiomegaly with pulmonary venous congestion and bilateral interstitial prominence noted consistent with congestive heart failure and interstitial edema. No pleural effusion or pneumothorax. No acute osseous abnormality.  IMPRESSION: 1. PICC line in good anatomic position. 2. Congestive heart failure with pulmonary interstitial edema.   Electronically Signed   By: Marcello Moores  Register   On: 09/19/2013 10:23   Dg Knee 1-2 Views Left  09/13/2013  CLINICAL DATA:  Bilateral knee pain without trauma.  EXAM: LEFT KNEE - 1-2 VIEW  COMPARISON:  DG KNEE 2 VIEWS*L* dated 06/17/2008  FINDINGS: Two views of the left knee demonstrate prior arthroplasty. No acute hardware complication. Limited evaluation for joint effusion, secondary to patient body habitus.  Two views of the right knee demonstrate prior arthroplasty. No acute hardware complication. Question anterior and prepatellar soft tissue swelling versus secondary to patient body habitus. Extremely limited evaluation for joint effusion, secondary to patient body habitus and possible overlying soft tissue swelling.  IMPRESSION: Expected appearance after bilateral knee arthroplasties. No acute hardware complication.  Limited evaluation for joint effusion, especially on the right.   Electronically Signed   By: Abigail Miyamoto M.D.   On: 09/13/2013 12:04   Dg Knee 2 Views Right  09/20/2013   CLINICAL DATA:  Pain  EXAM: RIGHT KNEE - 1-2 VIEW  COMPARISON:  Jan 30, 2013  FINDINGS: Frontal and lateral views were obtained. Patient has undergone total knee replacement with femoral and tibial components appearing well-seated There is no fracture  or dislocation. There is a moderate joint effusion. No bony destruction.  IMPRESSION: Joint effusion. Prosthetic components appear well seated. No acute fracture.   Electronically Signed   By: Lowella Grip M.D.   On: 09/20/2013 10:13   Ir Fluoro Guide Cv Line Right  09/19/2013   EXAM: ULTRASOUND AND FLUOROSCOPIC GUIDED PICC LINE INSERTION  MEDICATIONS: None.  TECHNIQUE: The procedure, risks, benefits, and alternatives were explained to the patient and informed written consent was obtained. A timeout was performed prior to the initiation of the procedure.  The right upper extremity was prepped with chlorhexidine in a sterile fashion, and a sterile drape was applied covering the operative field. Maximum barrier sterile technique with sterile gowns and gloves were used for the procedure. A timeout was performed prior to the initiation of the procedure. Local anesthesia was provided with 1% lidocaine.  Under direct ultrasound guidance, the right brachial vein was accessed with a micropuncture kit after the overlying soft tissues were anesthetized with 1% lidocaine. An ultrasound image was saved for documentation purposes. A guidewire was advanced to the level of the superior caval-atrial junction for measurement purposes and the PICC line was cut to length. A peel-away sheath was placed and a 42 cm, 5 [place], dual lumen was inserted to level of the superior caval-atrial junction. A post procedure spot fluoroscopic was obtained. The catheter easily aspirated and flushed and was sutured in place. A dressing was placed. The patient tolerated the procedure well without immediate post procedural complication.  CONTRAST:  None  FLUOROSCOPY TIME:  1 min, 30 seconds  COMPLICATIONS: None immediate  FINDINGS: After catheter placement, the tip lies within the superior cavoatrial junction. The catheter aspirates and flushes normally and is ready for immediate use.  IMPRESSION: Successful ultrasound and fluoroscopic guided  placement of a right brachial vein approach, 42 cm, 5 French, dual lumen PICC with tip at the superior caval-atrial junction. The PICC line is ready for immediate use.  INDICATION: Poor venous access, in need of intravenous access for medication administration and blood draws   Electronically Signed   By: Sandi Mariscal M.D.   On: 09/19/2013 11:44   Ir US Guide Vasc Access Right  09/19/2013   EXAM: ULTRASOUND AND FLUOROSCOPIC GUIDED PICC LINE INSERTION  MEDICATIONS: None.  TECHNIQUE: The procedure, risks, benefits, and alternatives were explained to the patient and informed written consent was obtained. A timeout was performed prior  to the initiation of the procedure.  The right upper extremity was prepped with chlorhexidine in a sterile fashion, and a sterile drape was applied covering the operative field. Maximum barrier sterile technique with sterile gowns and gloves were used for the procedure. A timeout was performed prior to the initiation of the procedure. Local anesthesia was provided with 1% lidocaine.  Under direct ultrasound guidance, the right brachial vein was accessed with a micropuncture kit after the overlying soft tissues were anesthetized with 1% lidocaine. An ultrasound image was saved for documentation purposes. A guidewire was advanced to the level of the superior caval-atrial junction for measurement purposes and the PICC line was cut to length. A peel-away sheath was placed and a 42 cm, 5 [place], dual lumen was inserted to level of the superior caval-atrial junction. A post procedure spot fluoroscopic was obtained. The catheter easily aspirated and flushed and was sutured in place. A dressing was placed. The patient tolerated the procedure well without immediate post procedural complication.  CONTRAST:  None  FLUOROSCOPY TIME:  1 min, 30 seconds  COMPLICATIONS: None immediate  FINDINGS: After catheter placement, the tip lies within the superior cavoatrial junction. The catheter aspirates and  flushes normally and is ready for immediate use.  IMPRESSION: Successful ultrasound and fluoroscopic guided placement of a right brachial vein approach, 42 cm, 5 French, dual lumen PICC with tip at the superior caval-atrial junction. The PICC line is ready for immediate use.  INDICATION: Poor venous access, in need of intravenous access for medication administration and blood draws   Electronically Signed   By: Sandi Mariscal M.D.   On: 09/19/2013 11:44   Dg Chest Port 1 View  09/22/2013   CLINICAL DATA:  Postoperative from right knee surgery now with hypoxia  EXAM: PORTABLE CHEST - 1 VIEW  COMPARISON:  PA and lateral chest x-ray of September 19, 2013  FINDINGS: The lungs are reasonably well inflated. The interstitial markings are increased bilaterally. The cardiopericardial silhouette remains enlarged. The pulmonary vascularity appears more engorged. There is a right-sided PICC line in place with the tip in the region of the proximal SVC which is stable. No pleural effusion is evident.  IMPRESSION: The findings are worrisome for pulmonary interstitial edema which may be of cardiac or noncardiac cause. No discrete alveolar pneumonia is demonstrated.   Electronically Signed   By: David  Martinique   On: 09/22/2013 13:44   Dg Chest Port 1 View  09/15/2013   CLINICAL DATA:  Infection, shortness of breath  EXAM: PORTABLE CHEST - 1 VIEW  COMPARISON:  01/22/2013  FINDINGS: Cardiomegaly evident with increased vascular and interstitial prominence, suspect early edema. No large effusion or pneumothorax. No definite focal pneumonia or collapse. Trachea is midline. Postop changes in the right thyroid region.  IMPRESSION: Cardiomegaly with early interstitial edema pattern.   Electronically Signed   By: Daryll Brod M.D.   On: 09/15/2013 13:13    Microbiology: Recent Results (from the past 240 hour(s))  SURGICAL PCR SCREEN     Status: Abnormal   Collection Time    09/16/13  4:47 PM      Result Value Range Status   MRSA,  PCR POSITIVE (*) NEGATIVE Final   Comment: RESULT CALLED TO, READ BACK BY AND VERIFIED WITH:     K.BUTCHEE AT 1818 ON 11JAN15 BY C.BONGEL   Staphylococcus aureus POSITIVE (*) NEGATIVE Final   Comment:            The Xpert SA Assay (FDA  approved for NASAL specimens     in patients over 8 years of age),     is one component of     a comprehensive surveillance     program.  Test performance has     been validated by [employer] for patients greater     than or equal to 44 year old.     It is not intended     to diagnose infection nor to     guide or monitor treatment.  MRSA PCR SCREENING     Status: Abnormal   Collection Time    09/17/13  7:33 AM      Result Value Range Status   MRSA by PCR POSITIVE (*) NEGATIVE Final   Comment:            The GeneXpert MRSA Assay (FDA     approved for NASAL specimens     only), is one component of a     comprehensive MRSA colonization     surveillance program. It is not     intended to diagnose MRSA     infection nor to guide or     monitor treatment for     MRSA infections.     RESULT CALLED TO, READ BACK BY AND VERIFIED WITH:     E. MACABUAG RN AT 0940 ON 1.12.15 BY SHUEA  URINE CULTURE     Status: None   Collection Time    09/22/13  3:53 PM      Result Value Range Status   Specimen Description URINE, CLEAN CATCH   Final   Special Requests NONE   Final   Culture  Setup Time     Final   Value: 09/22/2013 22:44     Performed at [facility]     Final   Value: >=100,000 COLONIES/ML     Performed at [facility]   Culture     Final   Value: YEAST     Performed at [facility]   Report Status 09/23/2013 FINAL   Final     Labs: Basic Metabolic Panel:  Recent Labs Lab 09/21/13 0520 09/22/13 0355 09/23/13 0411 09/24/13 0642 09/25/13 0430  NA 137 133* 131* 135* 139  K 4.6 5.1 5.1 4.9 5.1  CL 98 95* 94* 95* 99  CO2 _0 GLUCOSE 120* 100* 126* 115* 111*  BUN 31* 38* 43*  43* 38*  CREATININE 1.37* 2.55* 2.36* 2.01* 1.70*  CALCIUM 7.6* 7.3* 7.1* 7.6* 7.9*  PHOS  --   --   --   --  4.3   Liver Function Tests:  Recent Labs Lab 09/25/13 0430  ALBUMIN 1.6*   No results found for this basename: LIPASE, AMYLASE,  in the last 168 hours No results found for this basename: AMMONIA,  in the last 168 hours CBC:  Recent Labs Lab 09/21/13 0520 09/22/13 0355 09/23/13 0411 09/24/13 0642 09/25/13 0430  WBC 8.8 9.8 8.5 6.2 5.8  HGB 10.1* 9.2* 8.8* 8.6* 8.8*  HCT 30.8* 28.8* 28.0* 27.2* 27.1*  MCV 90.6 92.9 93.6 92.8 92.5  PLT 228 237 258 277 257   Cardiac Enzymes:  Recent Labs Lab 09/23/13 1310  CKTOTAL 111   BNP: BNP (last 3 results)  Recent Labs  09/20/13 0550  PROBNP 1310.0*   CBG:  Recent Labs Lab 09/24/13 1143 09/24/13 1655 09/24/13 2120 09/25/13 0719 09/25/13 1149  GLUCAP 103*  127* 139* 101* 125*       Signed:  Ismael Treptow  [facility] 09/25/2013, 8:47 PM

## 2013-09-25 NOTE — Progress Notes (Signed)
[facility] for Infectious Disease  Date of Admission:  09/13/2013  Antibiotics: Antibiotics Given (last 72 hours)   Date/Time Action Medication Dose Rate   09/23/13 1409 Given   DAPTOmycin (CUBICIN) 500 mg in sodium chloride 0.9 % IVPB 500 mg 220 mL/hr   09/24/13 1624 Given   DAPTOmycin (CUBICIN) 500 mg in sodium chloride 0.9 % IVPB 500 mg 220 mL/hr      Subjective: No complaints, rash still itchy  Objective: Temp:  [97.9 F (36.6 C)-98.2 F (36.8 C)] 98.2 F (36.8 C) (01/20 0620) Pulse Rate:  [96-102] 98 (01/20 0620) Resp:  [18-20] 18 (01/20 0730) BP: (131-146)/(43-46) 131/43 mmHg (01/20 0620) SpO2:  [100 %] 100 % (01/20 0620) Weight:  [296 lb 15.4 oz (134.7 kg)] 296 lb 15.4 oz (134.7 kg) (01/20 0620)  General: Awake, alert, nad Skin: rash on back, papillary Lungs: CTA B Cor: RRR Abdomen: soft, nt, nd   Lab Results Lab Results  Component Value Date   WBC 5.8 09/25/2013   HGB 8.8* 09/25/2013   HCT 27.1* 09/25/2013   MCV 92.5 09/25/2013   PLT 257 09/25/2013    Lab Results  Component Value Date   CREATININE 1.70* 09/25/2013   BUN 38* 09/25/2013   NA 139 09/25/2013   K 5.1 09/25/2013   CL 99 09/25/2013   CO2 30 09/25/2013    Lab Results  Component Value Date   ALT 19 08/17/2012   AST 32 08/17/2012   ALKPHOS 94 08/17/2012   BILITOT 0.8 08/17/2012      Microbiology: Recent Results (from the past 240 hour(s))  SURGICAL PCR SCREEN     Status: Abnormal   Collection Time    09/16/13  4:47 PM      Result Value Range Status   MRSA, PCR POSITIVE (*) NEGATIVE Final   Comment: RESULT CALLED TO, READ BACK BY AND VERIFIED WITH:     K.BUTCHEE AT 1818 ON 11JAN15 BY C.BONGEL   Staphylococcus aureus POSITIVE (*) NEGATIVE Final   Comment:            The Xpert SA Assay (FDA     approved for NASAL specimens     in patients over 64 years of age),     is one component of     a comprehensive surveillance     program.  Test performance has     been validated by [employer] for patients greater     than or equal to 79 year old.     It is not intended     to diagnose infection nor to     guide or monitor treatment.  MRSA PCR SCREENING     Status: Abnormal   Collection Time    09/17/13  7:33 AM      Result Value Range Status   MRSA by PCR POSITIVE (*) NEGATIVE Final   Comment:            The GeneXpert MRSA Assay (FDA     approved for NASAL specimens     only), is one component of a     comprehensive MRSA colonization     surveillance program. It is not     intended to diagnose MRSA     infection nor to guide or     monitor treatment for     MRSA infections.     RESULT CALLED TO, READ BACK BY AND VERIFIED WITH:     E.  Dayton RN AT 0940 ON 1.12.15 BY SHUEA  URINE CULTURE     Status: None   Collection Time    09/22/13  3:53 PM      Result Value Range Status   Specimen Description URINE, CLEAN CATCH   Final   Special Requests NONE   Final   Culture  Setup Time     Final   Value: 09/22/2013 22:44     Performed at [facility]     Final   Value: >=100,000 COLONIES/ML     Performed at [facility]   Culture     Final   Value: YEAST     Performed at [facility]   Report Status 09/23/2013 FINAL   Final    Studies/Results: No results found.  Assessment/Plan: 1) PJI with resection - MRSA.  On daptomycin after rash on vancomycin.  Projected course of 6 weeks through Feb 27th.  Weekly CBC, cmp, CK to [facility] We will arrange follow up in 2-3 weeks  2) yeast in urine - asymptomatic, no indication to treat  Scharlene Gloss, [facility] Infectious Disease [place] [email or web address] [phone] pager   [phone] cell 09/25/2013, 9:26 AM

## 2013-09-25 NOTE — Progress Notes (Signed)
Patient still has a bed at [facility] when medically stable. CSW will facilitate discharge when ready.   Winfred Leeds, [facility] Clinical Social Worker cell #: [phone]

## 2013-09-25 NOTE — Progress Notes (Signed)
[facility] PROGRESS NOTE  LUISA LOUK DUK:[id number] DOB: 09/06/1941 DOA: 09/13/2013 PCP: Eulas Post, MD  Brief narrative: 73 year old female with history of bilateral knee replacement by Dr. Gladstone Lighter several years ago now presents to [facility] ED 09/13/2013 with main concern of one week duration of progressively worsening right knee pain, throbbing and constant, radiating to the right ankle, 10/10 in severity, associated with erythema and swelling, worse with ambulation and somewhat improved with rest, otherwise with no specific alleviating factors, no similar events in the past.  After admission she was found to be volume overloaded and diuresed with IV lasix Underwent R knee arthroplasty 1/16. Subsequently had acute renal failure from vancomycin toxicity, but we stopped the vancomycin and she was started on IV daptomycin per infectious disease. She is also being followed by nephrology and her creatinine is slowly improving at this point.  Assessment and Plan:   Principal Problem:  MRSA R knee septic arthritis - status post right knee joint aspiration 09/13/2013  - joint fluid positive for MRSA;  - On vancomycin, s/p 10days , dose held due to toxicity,  - now on Daptomycin per ID, needs 6 weeks of this till 2/27 - pain control, PT - s/p OR 1/17, Abx spacer placed -needs rehab  Acute Diastolic CHF -volume status improving -5L negative in 3days, will stop IV lasix and change to PO lasix 40mg  BID -appreciate dr.Schertz consult -she was volume overloaded and diuresed pre op too -cath 2009 normal -ECHO done normal EF  Acute renal failure  - due to Vanc Toxicity -improving -stopped VAnc dosing per pharmacy -improved with aggressive diuresis, change lasix to PO today -bmet in am -Appreciate Dr.Schertz consult  Rash -drug rash vs contact dermatitis -improving  Anemia of chronic disease  - Hg stable   Diabetes mellitus  - continue to hold Metformin due to ARF  - pt on  SSI for now  - A1C 6.5 (09/13/2012)   Hpothyroidism  - continue synthroid   HTN  - continue to hold Lisinopril due to renal insufficiency  -continue hydralazine PRN   Consultants:  Ortho  Procedures/Studies:  R knee arthocentesis PROCEDURE: Resection of right total knee arthroplasty, placement of  antibiotic spacer, per Dr.Olin 1/16   Antibiotics:  Vancomycin 01/08 -->  Maxipime 01/08 --> 01/11  Code Status: Full  DVT prophylaxis: Lovenox on hold for OR Family Communication: Pt and husband at bedside  Disposition Plan: SNF recommended, SW assisting D/C plan  HPI/Subjective: No events overnight, breathing ok, some sore thorat   Objective: Filed Vitals:   09/25/13 0620 09/25/13 0730 09/25/13 1124 09/25/13 1558  BP: 131/43     Pulse: 98     Temp: 98.2 F (36.8 C)     TempSrc: Oral     Resp: 19 18 16 18   Height:      Weight: 134.7 kg (296 lb 15.4 oz)     SpO2: 100%       Intake/Output Summary (Last 24 hours) at 09/25/13 1639 Last data filed at 09/25/13 1557  Gross per 24 hour  Intake   1070 ml  Output   1600 ml  Net   -530 ml   Exam:  General: Pt is alert, follows commands appropriately, not in acute distress  Cardiovascular: Regular rate and rhythm, S1/S2, no murmurs, no rubs, no gallops  HEENt: no thrush, mild pharyngeal erythema Respiratory: Clear to auscultation bilaterally, diminished at bases Abdomen: Soft, non tender, non distended, bowel sounds present, no guarding  Extremities:  R knee with erythema, swelling, 1plus LE pitting edema, pulses DP and PT palpable bilaterally  Neuro: Grossly nonfocal   Data Reviewed: Basic Metabolic Panel:  Recent Labs Lab 09/21/13 0520 09/22/13 0355 09/23/13 0411 09/24/13 0642 09/25/13 0430  NA 137 133* 131* 135* 139  K 4.6 5.1 5.1 4.9 5.1  CL 98 95* 94* 95* 99  CO2 29 28 26 28 30   GLUCOSE 120* 100* 126* 115* 111*  BUN 31* 38* 43* 43* 38*  CREATININE 1.37* 2.55* 2.36* 2.01* 1.70*  CALCIUM 7.6* 7.3* 7.1*  7.6* 7.9*  PHOS  --   --   --   --  4.3   CBC:  Recent Labs Lab 09/21/13 0520 09/22/13 0355 09/23/13 0411 09/24/13 0642 09/25/13 0430  WBC 8.8 9.8 8.5 6.2 5.8  HGB 10.1* 9.2* 8.8* 8.6* 8.8*  HCT 30.8* 28.8* 28.0* 27.2* 27.1*  MCV 90.6 92.9 93.6 92.8 92.5  PLT 228 237 258 277 257    CBG:  Recent Labs Lab 09/24/13 1143 09/24/13 1655 09/24/13 2120 09/25/13 0719 09/25/13 1149  GLUCAP 103* 127* 139* 101* 125*    Recent Results (from the past 240 hour(s))  ANAEROBIC CULTURE     Status: None   Collection Time    09/13/13  8:00 PM      Result Value Range Status   Specimen Description KNEE RIGHT KNEE JOINT   Final   Special Requests ANAC ONLY   Final   Gram Stain     Final   Value: ABUNDANT WBC PRESENT,BOTH PMN AND MONONUCLEAR     NO SQUAMOUS EPITHELIAL CELLS SEEN     MODERATE GRAM POSITIVE COCCI     IN PAIRS IN CLUSTERS     Performed at [facility]   Culture     Final   Value: NO ANAEROBES ISOLATED     Performed at [facility]   Report Status 09/19/2013 FINAL   Final  BODY FLUID CULTURE     Status: None   Collection Time    09/14/13  4:27 PM      Result Value Range Status   Specimen Description KNEE   Final   Special Requests NONE   Final   Gram Stain     Final   Value: ABUNDANT WBC PRESENT,BOTH PMN AND MONONUCLEAR     MODERATE GRAM POSITIVE COCCI     IN PAIRS IN CLUSTERS Gram Stain Report Called to,Read Back By and Verified With: Gram Stain Report Called to,Read Back By and Verified With: DONNA A @1131  ON 09/15/13 BY SMIAS     Performed at [facility]   Culture     Final   Value: ABUNDANT METHICILLIN RESISTANT STAPHYLOCOCCUS AUREUS     Note: RIFAMPIN AND GENTAMICIN SHOULD NOT BE USED AS SINGLE DRUGS FOR TREATMENT OF STAPH INFECTIONS. CRITICAL RESULT CALLED TO, READ BACK BY AND VERIFIED WITH: CAITLYN TAYLOR 09/16/13 @ 9:12AM BY RUSCOE A.     Performed at [facility]   Report Status 09/16/2013 FINAL   Final   Organism ID,  Bacteria METHICILLIN RESISTANT STAPHYLOCOCCUS AUREUS   Final  SURGICAL PCR SCREEN     Status: Abnormal   Collection Time    09/16/13  4:47 PM      Result Value Range Status   MRSA, PCR POSITIVE (*) NEGATIVE Final   Comment: RESULT CALLED TO, READ BACK BY AND VERIFIED WITH:     K.BUTCHEE AT 1818 ON 11JAN15 BY C.BONGEL   Staphylococcus aureus POSITIVE (*)  NEGATIVE Final  MRSA PCR SCREENING     Status: Abnormal   Collection Time    09/17/13  7:33 AM      Result Value Range Status   MRSA by PCR POSITIVE (*) NEGATIVE Final     Scheduled Meds: . aspirin EC  325 mg Oral BID  . DAPTOmycin (CUBICIN)  IV  500 mg Intravenous Q24H  . ferrous sulfate  325 mg Oral TID PC  . furosemide  40 mg Intravenous Q12H  . insulin aspart  0-9 Units Subcutaneous TID WC  . levothyroxine  125 mcg Oral QAC breakfast  . polyethylene glycol  17 g Oral Daily  . sodium chloride  10 mL Intravenous Q12H   Continuous Infusions:     Domenic Polite, MD  [facility] Pager [phone]  If 7PM-7AM, please contact night-coverage [email or web address] Password [facility] 09/25/2013, 4:39 PM   LOS: 12 days

## 2013-09-26 LAB — RENAL FUNCTION PANEL
ALBUMIN: 1.6 g/dL — AB (ref 3.5–5.2)
BUN: 34 mg/dL — AB (ref 6–23)
CO2: 31 mEq/L (ref 19–32)
Calcium: 7.9 mg/dL — ABNORMAL LOW (ref 8.4–10.5)
Chloride: 97 mEq/L (ref 96–112)
Creatinine, Ser: 1.59 mg/dL — ABNORMAL HIGH (ref 0.50–1.10)
GFR calc Af Amer: 36 mL/min — ABNORMAL LOW (ref >90)
GFR, EST NON AFRICAN AMERICAN: 31 mL/min — AB (ref >90)
Glucose, Bld: 104 mg/dL — ABNORMAL HIGH (ref 70–99)
PHOSPHORUS: 3.9 mg/dL (ref 2.3–4.6)
POTASSIUM: 4.8 meq/L (ref 3.7–5.3)
SODIUM: 138 meq/L (ref 137–147)

## 2013-09-26 LAB — GLUCOSE, CAPILLARY
Glucose-Capillary: 142 mg/dL — ABNORMAL HIGH (ref 70–99)
Glucose-Capillary: 107 mg/dL — ABNORMAL HIGH (ref 70–99)
Glucose-Capillary: 117 mg/dL — ABNORMAL HIGH (ref 70–99)
Glucose-Capillary: 166 mg/dL — ABNORMAL HIGH (ref 70–99)

## 2013-09-26 MED ORDER — HEPARIN SOD (PORK) LOCK FLUSH 100 UNIT/ML IV SOLN
250.0000 [IU] | INTRAVENOUS | Status: AC | PRN
  Administered 2013-09-26: 250 [IU]

## 2013-09-26 MED ORDER — HYDROCODONE-ACETAMINOPHEN 5-325 MG PO TABS: 1.0000 | ORAL_TABLET | Freq: Four times a day (QID) | ORAL | Status: DC | PRN

## 2013-09-26 NOTE — Progress Notes (Signed)
Report called to Deneise Lever, RN at [facility].

## 2013-09-26 NOTE — Progress Notes (Signed)
Pt transferred to [facility] via [facility] in stable condition. Pt in possession of transfer packet, all personal belongings, and knee immobilizer.

## 2013-09-26 NOTE — Progress Notes (Signed)
Patient is set to discharge to [facility] today. Patient & husband at bedside aware. Discharge packet given to RN, Elzie Rings. [facility] called for transport pickup (Service Request Id: [id number]).   Clinical Social Work Department CLINICAL SOCIAL WORK PLACEMENT NOTE 09/26/2013  Patient:  Amy Burns, Amy Burns  Account Number:  [id number] Admit date:  09/13/2013  Clinical Social Worker:  Sindy Messing, LCSW  Date/time:  09/14/2013 02:00 PM  Clinical Social Work is seeking post-discharge placement for this patient at the following level of care:   [facility]   (*CSW will update this form in Epic as items are completed)   09/14/2013  Patient/family provided with [facility] Department of Clinical Social Work's list of facilities offering this level of care within the geographic area requested by the patient (or if unable, by the patient's family).  09/14/2013  Patient/family informed of their freedom to choose among providers that offer the needed level of care, that participate in Medicare, Medicaid or managed care program needed by the patient, have an available bed and are willing to accept the patient.  09/14/2013  Patient/family informed of [facility]' ownership interest in [facility], as well as of the fact that they are under no obligation to receive care at this facility.  PASARR submitted to EDS on  PASARR number received from [place] on   FL2 transmitted to all facilities in geographic area requested by pt/family on  09/14/2013 FL2 transmitted to all facilities within larger geographic area on   Patient informed that his/her managed care company has contracts with or will negotiate with  certain facilities, including the following:     Patient/family informed of bed offers received:  09/26/2013 Patient chooses bed at [facility] Physician recommends and patient chooses bed at    Patient to be transferred to [facility] on   09/26/2013 Patient to be transferred to facility by [facility]  The following physician request were entered in Epic:   Additional Comments:   Winfred Leeds, [place] Worker cell #: [phone]

## 2013-09-26 NOTE — Progress Notes (Signed)
Patient ID: Amy Burns, female   DOB: 01/06/1941, 73 y.o.   MRN: [id number] Subjective: 5 Days Post-Op Procedure(s) (LRB): RIGHT TOTAL KNEE RESECTION WITH ANTIBIOTIC SPACER (Right)    Patient reports pain as mild.  Doing fairly well with post-op course ARF resolving  Objective:   VITALS:   Filed Vitals:   09/26/13 0614  BP: 142/46  Pulse: 97  Temp: 98.5 F (36.9 C)  Resp: 20    Neurovascular intact Incision: dressing C/D/I  LABS  Recent Labs  09/24/13 0642 09/25/13 0430  HGB 8.6* 8.8*  HCT 27.2* 27.1*  WBC 6.2 5.8  PLT 277 257     Recent Labs  09/24/13 0642 09/25/13 0430 09/26/13 0308  NA 135* 139 138  K 4.9 5.1 4.8  BUN 43* 38* 34*  CREATININE 2.01* 1.70* 1.59*  GLUCOSE 115* 111* 104*    No results found for this basename: LABPT, INR,  in the last 72 hours   Assessment/Plan: 5 Days Post-Op Procedure(s) (LRB): RIGHT TOTAL KNEE RESECTION WITH ANTIBIOTIC SPACER (Right)   Up with therapy Discharge to SNF today?  IV Antibiotics for 6 weeks per ID Follow up with Anoop Hemmer in 2 weeks for wound check  Will place Aquacell dressing to right knee prior to discharge

## 2013-09-26 NOTE — Progress Notes (Signed)
[facility] PROGRESS NOTE  Amy Burns ATF:[id number] DOB: [birth year] DOA: 09/13/2013 PCP: Eulas Post, MD  Assessment/Plan: MRSA R knee septic arthritis  - status post right knee joint aspiration 09/13/2013  - joint fluid positive for MRSA;  - Was On vancomycin, s/p 10days , dose then held due to toxicity,  - now on Daptomycin per ID, needs 6 weeks of this till 2/27  -needs weekly CBC, Cmet, CK faxed to ID clinic while on Daptmycin  - pain control, PT following SNF recommended for rehab  - s/p OR 1/17, Abx spacer placed  -needs rehab , ORtho recommends 25% weight bearing R knee  Acute Diastolic CHF  -volume status improving  -7L negative in 3days, stopping IV lasix today and transitioning to PO lasix 80m BID  -greatly appreciate Renal consult per dr.Schertz  -she was volume overloaded and diuresed pre op too  -cath 2009 normal  -ECHO done normal EF  Acute renal failure  - due to Vanc Toxicity  -improving, peak creatinine was 2.5 now 1.7  -stopped VAnc dosing per pharmacy  -was also volume overloaded and improved with aggressive diuresis, change lasix to PO today  -Serum creatinine 1.59 on the day of discharge -followed by Renal  Rash  -drug rash vs contact dermatitis  -improving  Anemia of chronic disease  - Hg stable  Diabetes mellitus  - continue to hold Metformin due to ARF  - pt on SSI for now  - A1C 6.5 (09/13/2012)  Hpothyroidism  - continue synthroid  HTN  - continue to hold Lisinopril due to renal insufficiency  -Bp stable  Consultants:  Ortho  ID Dr.Comer  Renal, Dr.Schertz  Procedures/Studies:  R knee arthocentesis PROCEDURE: Resection of right total knee arthroplasty, placement of  antibiotic spacer, per Dr.Olin 1/16        Procedures/Studies: Dg Chest 2 View  09/19/2013   CLINICAL DATA:  Shortness of breath and weakness .  EXAM: CHEST  2 VIEW  COMPARISON:  09/15/2013.  FINDINGS: PICC line in good anatomic position. Cardiomegaly with  pulmonary venous congestion and bilateral interstitial prominence noted consistent with congestive heart failure and interstitial edema. No pleural effusion or pneumothorax. No acute osseous abnormality.  IMPRESSION: 1. PICC line in good anatomic position. 2. Congestive heart failure with pulmonary interstitial edema.   Electronically Signed   By: TMarcello Moores Register   On: 09/19/2013 10:23   Dg Knee 1-2 Views Left  09/13/2013   CLINICAL DATA:  Bilateral knee pain without trauma.  EXAM: LEFT KNEE - 1-2 VIEW  COMPARISON:  DG KNEE 2 VIEWS*L* dated 06/17/2008  FINDINGS: Two views of the left knee demonstrate prior arthroplasty. No acute hardware complication. Limited evaluation for joint effusion, secondary to patient body habitus.  Two views of the right knee demonstrate prior arthroplasty. No acute hardware complication. Question anterior and prepatellar soft tissue swelling versus secondary to patient body habitus. Extremely limited evaluation for joint effusion, secondary to patient body habitus and possible overlying soft tissue swelling.  IMPRESSION: Expected appearance after bilateral knee arthroplasties. No acute hardware complication.  Limited evaluation for joint effusion, especially on the right.   Electronically Signed   By: KAbigail MiyamotoM.D.   On: 09/13/2013 12:04   Dg Knee 2 Views Right  09/20/2013   CLINICAL DATA:  Pain  EXAM: RIGHT KNEE - 1-2 VIEW  COMPARISON:  Jan 30, 2013  FINDINGS: Frontal and lateral views were obtained. Patient has undergone total knee replacement with femoral and tibial components appearing well-seated  There is no fracture or dislocation. There is a moderate joint effusion. No bony destruction.  IMPRESSION: Joint effusion. Prosthetic components appear well seated. No acute fracture.   Electronically Signed   By: Lowella Grip M.D.   On: 09/20/2013 10:13   Ir Fluoro Guide Cv Line Right  09/19/2013   EXAM: ULTRASOUND AND FLUOROSCOPIC GUIDED PICC LINE INSERTION  MEDICATIONS:  None.  TECHNIQUE: The procedure, risks, benefits, and alternatives were explained to the patient and informed written consent was obtained. A timeout was performed prior to the initiation of the procedure.  The right upper extremity was prepped with chlorhexidine in a sterile fashion, and a sterile drape was applied covering the operative field. Maximum barrier sterile technique with sterile gowns and gloves were used for the procedure. A timeout was performed prior to the initiation of the procedure. Local anesthesia was provided with 1% lidocaine.  Under direct ultrasound guidance, the right brachial vein was accessed with a micropuncture kit after the overlying soft tissues were anesthetized with 1% lidocaine. An ultrasound image was saved for documentation purposes. A guidewire was advanced to the level of the superior caval-atrial junction for measurement purposes and the PICC line was cut to length. A peel-away sheath was placed and a 42 cm, 5 [place], dual lumen was inserted to level of the superior caval-atrial junction. A post procedure spot fluoroscopic was obtained. The catheter easily aspirated and flushed and was sutured in place. A dressing was placed. The patient tolerated the procedure well without immediate post procedural complication.  CONTRAST:  None  FLUOROSCOPY TIME:  1 min, 30 seconds  COMPLICATIONS: None immediate  FINDINGS: After catheter placement, the tip lies within the superior cavoatrial junction. The catheter aspirates and flushes normally and is ready for immediate use.  IMPRESSION: Successful ultrasound and fluoroscopic guided placement of a right brachial vein approach, 42 cm, 5 French, dual lumen PICC with tip at the superior caval-atrial junction. The PICC line is ready for immediate use.  INDICATION: Poor venous access, in need of intravenous access for medication administration and blood draws   Electronically Signed   By: Sandi Mariscal M.D.   On: 09/19/2013 11:44   Ir US Guide  Vasc Access Right  09/19/2013   EXAM: ULTRASOUND AND FLUOROSCOPIC GUIDED PICC LINE INSERTION  MEDICATIONS: None.  TECHNIQUE: The procedure, risks, benefits, and alternatives were explained to the patient and informed written consent was obtained. A timeout was performed prior to the initiation of the procedure.  The right upper extremity was prepped with chlorhexidine in a sterile fashion, and a sterile drape was applied covering the operative field. Maximum barrier sterile technique with sterile gowns and gloves were used for the procedure. A timeout was performed prior to the initiation of the procedure. Local anesthesia was provided with 1% lidocaine.  Under direct ultrasound guidance, the right brachial vein was accessed with a micropuncture kit after the overlying soft tissues were anesthetized with 1% lidocaine. An ultrasound image was saved for documentation purposes. A guidewire was advanced to the level of the superior caval-atrial junction for measurement purposes and the PICC line was cut to length. A peel-away sheath was placed and a 42 cm, 5 [place], dual lumen was inserted to level of the superior caval-atrial junction. A post procedure spot fluoroscopic was obtained. The catheter easily aspirated and flushed and was sutured in place. A dressing was placed. The patient tolerated the procedure well without immediate post procedural complication.  CONTRAST:  None  FLUOROSCOPY TIME:  1 min, 30 seconds  COMPLICATIONS: None immediate  FINDINGS: After catheter placement, the tip lies within the superior cavoatrial junction. The catheter aspirates and flushes normally and is ready for immediate use.  IMPRESSION: Successful ultrasound and fluoroscopic guided placement of a right brachial vein approach, 42 cm, 5 French, dual lumen PICC with tip at the superior caval-atrial junction. The PICC line is ready for immediate use.  INDICATION: Poor venous access, in need of intravenous access for medication  administration and blood draws   Electronically Signed   By: Sandi Mariscal M.D.   On: 09/19/2013 11:44   Dg Chest Port 1 View  09/22/2013   CLINICAL DATA:  Postoperative from right knee surgery now with hypoxia  EXAM: PORTABLE CHEST - 1 VIEW  COMPARISON:  PA and lateral chest x-ray of September 19, 2013  FINDINGS: The lungs are reasonably well inflated. The interstitial markings are increased bilaterally. The cardiopericardial silhouette remains enlarged. The pulmonary vascularity appears more engorged. There is a right-sided PICC line in place with the tip in the region of the proximal SVC which is stable. No pleural effusion is evident.  IMPRESSION: The findings are worrisome for pulmonary interstitial edema which may be of cardiac or noncardiac cause. No discrete alveolar pneumonia is demonstrated.   Electronically Signed   By: Orlander Norwood  [place]   On: 09/22/2013 13:44   Dg Chest Port 1 View  09/15/2013   CLINICAL DATA:  Infection, shortness of breath  EXAM: PORTABLE CHEST - 1 VIEW  COMPARISON:  01/22/2013  FINDINGS: Cardiomegaly evident with increased vascular and interstitial prominence, suspect early edema. No large effusion or pneumothorax. No definite focal pneumonia or collapse. Trachea is midline. Postop changes in the right thyroid region.  IMPRESSION: Cardiomegaly with early interstitial edema pattern.   Electronically Signed   By: Daryll Brod M.D.   On: 09/15/2013 13:13         Subjective: Patient complaint right knee pain. Denies any fevers, chills, chest pain, shortness breath, nausea, vomiting, diarrhea, abdominal pain.  Objective: Filed Vitals:   09/26/13 0000 09/26/13 0400 09/26/13 0614 09/26/13 0729  BP:   142/46   Pulse:   97   Temp:   98.5 F (36.9 C)   TempSrc:   Oral   Resp: 19 19 20 16   Height:      Weight:   136.1 kg (300 lb 0.7 oz)   SpO2: 96% 96% 93%     Intake/Output Summary (Last 24 hours) at 09/26/13 1043 Last data filed at 09/26/13 0618  Gross per 24 hour   Intake    670 ml  Output   2550 ml  Net  -1880 ml   Weight change: 0 kg (0 lb) Exam:   General:  Pt is alert, follows commands appropriately, not in acute distress  HEENT: No icterus, No thrush,  [place]/AT  Cardiovascular: RRR, S1/S2, no rubs, no gallops  Respiratory: Bibasilar crackles. No wheezing.  Abdomen: Soft/+BS, non tender, non distended, no guarding  Extremities: 1+ LE edema, R-knee staples intact without drainage  Data Reviewed: Basic Metabolic Panel:  Recent Labs Lab 09/22/13 0355 09/23/13 0411 09/24/13 0642 09/25/13 0430 09/26/13 0308  NA 133* 131* 135* 139 138  K 5.1 5.1 4.9 5.1 4.8  CL 95* 94* 95* 99 97  CO2 28 26 28 30 31   GLUCOSE 100* 126* 115* 111* 104*  BUN 38* 43* 43* 38* 34*  CREATININE 2.55* 2.36* 2.01* 1.70* 1.59*  CALCIUM 7.3* 7.1* 7.6* 7.9* 7.9*  PHOS  --   --   --  4.3 3.9   Liver Function Tests:  Recent Labs Lab 09/25/13 0430 09/26/13 0308  ALBUMIN 1.6* 1.6*   No results found for this basename: LIPASE, AMYLASE,  in the last 168 hours No results found for this basename: AMMONIA,  in the last 168 hours CBC:  Recent Labs Lab 09/21/13 0520 09/22/13 0355 09/23/13 0411 09/24/13 0642 09/25/13 0430  WBC 8.8 9.8 8.5 6.2 5.8  HGB 10.1* 9.2* 8.8* 8.6* 8.8*  HCT 30.8* 28.8* 28.0* 27.2* 27.1*  MCV 90.6 92.9 93.6 92.8 92.5  PLT 228 237 258 277 257   Cardiac Enzymes:  Recent Labs Lab 09/23/13 1310  CKTOTAL 111   BNP: No components found with this basename: POCBNP,  CBG:  Recent Labs Lab 09/25/13 0719 09/25/13 1149 09/25/13 1653 09/25/13 2131 09/26/13 0742  GLUCAP 101* 125* 142* 107* 117*    Recent Results (from the past 240 hour(s))  SURGICAL PCR SCREEN     Status: Abnormal   Collection Time    09/16/13  4:47 PM      Result Value Range Status   MRSA, PCR POSITIVE (*) NEGATIVE Final   Comment: RESULT CALLED TO, READ BACK BY AND VERIFIED WITH:     K.BUTCHEE AT 1818 ON 11JAN15 BY C.BONGEL   Staphylococcus aureus  POSITIVE (*) NEGATIVE Final   Comment:            The Xpert SA Assay (FDA     approved for NASAL specimens     in patients over 48 years of age),     is one component of     a comprehensive surveillance     program.  Test performance has     been validated by [employer] for patients greater     than or equal to 51 year old.     It is not intended     to diagnose infection nor to     guide or monitor treatment.  MRSA PCR SCREENING     Status: Abnormal   Collection Time    09/17/13  7:33 AM      Result Value Range Status   MRSA by PCR POSITIVE (*) NEGATIVE Final   Comment:            The GeneXpert MRSA Assay (FDA     approved for NASAL specimens     only), is one component of a     comprehensive MRSA colonization     surveillance program. It is not     intended to diagnose MRSA     infection nor to guide or     monitor treatment for     MRSA infections.     RESULT CALLED TO, READ BACK BY AND VERIFIED WITH:     E. MACABUAG RN AT 0940 ON 1.12.15 BY SHUEA  URINE CULTURE     Status: None   Collection Time    09/22/13  3:53 PM      Result Value Range Status   Specimen Description URINE, CLEAN CATCH   Final   Special Requests NONE   Final   Culture  Setup Time     Final   Value: 09/22/2013 22:44     Performed at [facility] Count     Final   Value: >=100,000 COLONIES/ML     Performed at [facility]   Culture     Final   Value: YEAST     Performed at [facility]   Report Status 09/23/2013 FINAL   Final     Scheduled Meds: . aspirin EC  325 mg Oral BID  . DAPTOmycin (CUBICIN)  IV  500 mg Intravenous Q24H  . ferrous sulfate  325 mg Oral TID PC  . furosemide  40 mg Intravenous Q12H  . insulin aspart  0-9 Units Subcutaneous TID WC  . levothyroxine  125 mcg Oral QAC breakfast  . polyethylene glycol  17 g Oral Daily  . sodium chloride  10 mL Intravenous Q12H   Continuous Infusions:    Elzie Knisley, DO  [facility] Pager  [phone]  If 7PM-7AM, please contact night-coverage [email or web address] Password [email or web address] 09/26/2013, 10:43 AM   LOS: 13 days

## 2013-09-29 ENCOUNTER — Inpatient Hospital Stay (HOSPITAL_COMMUNITY)
Admission: EM | Admit: 2013-09-29 | Discharge: 2013-10-03 | DRG: 193 | Disposition: A | Discharge status: Skilled Nursing Facility | Payer: Medicare Other | Attending: Family Medicine | Admitting: Family Medicine

## 2013-09-29 ENCOUNTER — Emergency Department (HOSPITAL_COMMUNITY): Payer: Medicare Other

## 2013-09-29 ENCOUNTER — Encounter (HOSPITAL_COMMUNITY): Payer: Self-pay | Admitting: Emergency Medicine

## 2013-09-29 DIAGNOSIS — E119 Type 2 diabetes mellitus without complications: Secondary | ICD-10-CM | POA: Diagnosis present

## 2013-09-29 DIAGNOSIS — Z794 Long term (current) use of insulin: Secondary | ICD-10-CM

## 2013-09-29 DIAGNOSIS — N950 Postmenopausal bleeding: Secondary | ICD-10-CM | POA: Diagnosis present

## 2013-09-29 DIAGNOSIS — G4730 Sleep apnea, unspecified: Secondary | ICD-10-CM | POA: Diagnosis present

## 2013-09-29 DIAGNOSIS — Z7901 Long term (current) use of anticoagulants: Secondary | ICD-10-CM

## 2013-09-29 DIAGNOSIS — Z7982 Long term (current) use of aspirin: Secondary | ICD-10-CM

## 2013-09-29 DIAGNOSIS — Y831 Surgical operation with implant of artificial internal device as the cause of abnormal reaction of the patient, or of later complication, without mention of misadventure at the time of the procedure: Secondary | ICD-10-CM | POA: Diagnosis present

## 2013-09-29 DIAGNOSIS — E876 Hypokalemia: Secondary | ICD-10-CM | POA: Diagnosis present

## 2013-09-29 DIAGNOSIS — Z79899 Other long term (current) drug therapy: Secondary | ICD-10-CM

## 2013-09-29 DIAGNOSIS — T8453XA Infection and inflammatory reaction due to internal right knee prosthesis, initial encounter: Secondary | ICD-10-CM

## 2013-09-29 DIAGNOSIS — N179 Acute kidney failure, unspecified: Secondary | ICD-10-CM

## 2013-09-29 DIAGNOSIS — D649 Anemia, unspecified: Secondary | ICD-10-CM

## 2013-09-29 DIAGNOSIS — Z86711 Personal history of pulmonary embolism: Secondary | ICD-10-CM

## 2013-09-29 DIAGNOSIS — I10 Essential (primary) hypertension: Secondary | ICD-10-CM | POA: Diagnosis present

## 2013-09-29 DIAGNOSIS — E46 Unspecified protein-calorie malnutrition: Secondary | ICD-10-CM

## 2013-09-29 DIAGNOSIS — M1990 Unspecified osteoarthritis, unspecified site: Secondary | ICD-10-CM | POA: Diagnosis present

## 2013-09-29 DIAGNOSIS — I509 Heart failure, unspecified: Secondary | ICD-10-CM | POA: Diagnosis present

## 2013-09-29 DIAGNOSIS — I5033 Acute on chronic diastolic (congestive) heart failure: Secondary | ICD-10-CM | POA: Diagnosis present

## 2013-09-29 DIAGNOSIS — A4902 Methicillin resistant Staphylococcus aureus infection, unspecified site: Secondary | ICD-10-CM | POA: Diagnosis present

## 2013-09-29 DIAGNOSIS — Z96659 Presence of unspecified artificial knee joint: Secondary | ICD-10-CM

## 2013-09-29 DIAGNOSIS — E039 Hypothyroidism, unspecified: Secondary | ICD-10-CM | POA: Diagnosis present

## 2013-09-29 DIAGNOSIS — Z888 Allergy status to other drugs, medicaments and biological substances status: Secondary | ICD-10-CM

## 2013-09-29 DIAGNOSIS — J189 Pneumonia, unspecified organism: Principal | ICD-10-CM | POA: Diagnosis present

## 2013-09-29 DIAGNOSIS — Z8249 Family history of ischemic heart disease and other diseases of the circulatory system: Secondary | ICD-10-CM

## 2013-09-29 DIAGNOSIS — Z6841 Body Mass Index (BMI) 40.0 and over, adult: Secondary | ICD-10-CM

## 2013-09-29 DIAGNOSIS — R5381 Other malaise: Secondary | ICD-10-CM | POA: Diagnosis present

## 2013-09-29 DIAGNOSIS — N939 Abnormal uterine and vaginal bleeding, unspecified: Secondary | ICD-10-CM

## 2013-09-29 DIAGNOSIS — T8450XA Infection and inflammatory reaction due to unspecified internal joint prosthesis, initial encounter: Secondary | ICD-10-CM | POA: Diagnosis present

## 2013-09-29 NOTE — ED Notes (Signed)
Per EMS , pt. From [facility] who was reported with SOB , tachycardia and anxiety , pt. Was  Admitted here on the 8th for Sepsis; infected Right Knee. Pt. Is alert and oriented x2. Denies of any pain.

## 2013-09-29 NOTE — ED Notes (Signed)
Bed: WA20 Expected date:  Expected time:  Means of arrival:  Comments: EMS [facility], tachy

## 2013-09-30 ENCOUNTER — Encounter (HOSPITAL_COMMUNITY): Payer: Self-pay | Admitting: Internal Medicine

## 2013-09-30 ENCOUNTER — Emergency Department (HOSPITAL_COMMUNITY): Payer: Medicare Other

## 2013-09-30 DIAGNOSIS — R059 Cough, unspecified: Secondary | ICD-10-CM

## 2013-09-30 DIAGNOSIS — D649 Anemia, unspecified: Secondary | ICD-10-CM | POA: Diagnosis present

## 2013-09-30 DIAGNOSIS — I509 Heart failure, unspecified: Secondary | ICD-10-CM | POA: Diagnosis present

## 2013-09-30 DIAGNOSIS — R509 Fever, unspecified: Secondary | ICD-10-CM

## 2013-09-30 DIAGNOSIS — E039 Hypothyroidism, unspecified: Secondary | ICD-10-CM

## 2013-09-30 DIAGNOSIS — J189 Pneumonia, unspecified organism: Secondary | ICD-10-CM | POA: Diagnosis present

## 2013-09-30 DIAGNOSIS — R0602 Shortness of breath: Secondary | ICD-10-CM

## 2013-09-30 DIAGNOSIS — I10 Essential (primary) hypertension: Secondary | ICD-10-CM

## 2013-09-30 DIAGNOSIS — R05 Cough: Secondary | ICD-10-CM

## 2013-09-30 LAB — CBC
HCT: 29.2 % — ABNORMAL LOW (ref 36.0–46.0)
Hemoglobin: 9.1 g/dL — ABNORMAL LOW (ref 12.0–15.0)
MCH: 29.5 pg (ref 26.0–34.0)
MCHC: 31.2 g/dL (ref 30.0–36.0)
MCV: 94.8 fL (ref 78.0–100.0)
Platelets: 239 10*3/uL (ref 150–400)
RBC: 3.08 MIL/uL — AB (ref 3.87–5.11)
RDW: 15.6 % — ABNORMAL HIGH (ref 11.5–15.5)
WBC: 6 10*3/uL (ref 4.0–10.5)

## 2013-09-30 LAB — CREATININE, SERUM
CREATININE: 1.34 mg/dL — AB (ref 0.50–1.10)
GFR calc Af Amer: 44 mL/min — ABNORMAL LOW (ref >90)
GFR calc non Af Amer: 38 mL/min — ABNORMAL LOW (ref >90)

## 2013-09-30 LAB — CBC WITH DIFFERENTIAL/PLATELET
Basophils Absolute: 0 10*3/uL (ref 0.0–0.1)
Basophils Relative: 0 % (ref 0–1)
Eosinophils Absolute: 0.1 10*3/uL (ref 0.0–0.7)
Eosinophils Relative: 3 % (ref 0–5)
HCT: 26.5 % — ABNORMAL LOW (ref 36.0–46.0)
Hemoglobin: 8.6 g/dL — ABNORMAL LOW (ref 12.0–15.0)
LYMPHS ABS: 0.9 10*3/uL (ref 0.7–4.0)
LYMPHS PCT: 19 % (ref 12–46)
MCH: 30.5 pg (ref 26.0–34.0)
MCHC: 32.5 g/dL (ref 30.0–36.0)
MCV: 94 fL (ref 78.0–100.0)
Monocytes Absolute: 0.4 10*3/uL (ref 0.1–1.0)
Monocytes Relative: 9 % (ref 3–12)
NEUTROS PCT: 69 % (ref 43–77)
Neutro Abs: 3.4 10*3/uL (ref 1.7–7.7)
PLATELETS: 224 10*3/uL (ref 150–400)
RBC: 2.82 MIL/uL — ABNORMAL LOW (ref 3.87–5.11)
RDW: 15.7 % — ABNORMAL HIGH (ref 11.5–15.5)
WBC: 4.9 10*3/uL (ref 4.0–10.5)

## 2013-09-30 LAB — PROCALCITONIN: PROCALCITONIN: 0.32 ng/mL

## 2013-09-30 LAB — GLUCOSE, CAPILLARY
GLUCOSE-CAPILLARY: 104 mg/dL — AB (ref 70–99)
Glucose-Capillary: 118 mg/dL — ABNORMAL HIGH (ref 70–99)
Glucose-Capillary: 111 mg/dL — ABNORMAL HIGH (ref 70–99)

## 2013-09-30 LAB — PRO B NATRIURETIC PEPTIDE
PRO B NATRI PEPTIDE: 2013 pg/mL — AB (ref 0–125)
PRO B NATRI PEPTIDE: 2097 pg/mL — AB (ref 0–125)

## 2013-09-30 LAB — POCT I-STAT TROPONIN I: Troponin i, poc: 0.03 ng/mL (ref 0.00–0.08)

## 2013-09-30 LAB — BASIC METABOLIC PANEL
BUN: 25 mg/dL — ABNORMAL HIGH (ref 6–23)
CALCIUM: 7.9 mg/dL — AB (ref 8.4–10.5)
CO2: 32 meq/L (ref 19–32)
Chloride: 96 mEq/L (ref 96–112)
Creatinine, Ser: 1.35 mg/dL — ABNORMAL HIGH (ref 0.50–1.10)
GFR calc Af Amer: 44 mL/min — ABNORMAL LOW (ref >90)
GFR calc non Af Amer: 38 mL/min — ABNORMAL LOW (ref >90)
Glucose, Bld: 143 mg/dL — ABNORMAL HIGH (ref 70–99)
POTASSIUM: 3.7 meq/L (ref 3.7–5.3)
SODIUM: 137 meq/L (ref 137–147)

## 2013-09-30 LAB — HEMOGLOBIN A1C
HEMOGLOBIN A1C: 6.4 % — AB (ref <5.7)
MEAN PLASMA GLUCOSE: 137 mg/dL — AB (ref <117)

## 2013-09-30 LAB — INFLUENZA PANEL BY PCR (TYPE A & B)
H1N1 flu by pcr: NOT DETECTED
Influenza A By PCR: NEGATIVE
Influenza B By PCR: NEGATIVE

## 2013-09-30 LAB — TSH: TSH: 0.65 u[IU]/mL (ref 0.350–4.500)

## 2013-09-30 LAB — CG4 I-STAT (LACTIC ACID): LACTIC ACID, VENOUS: 1.55 mmol/L (ref 0.5–2.2)

## 2013-09-30 LAB — MRSA PCR SCREENING: MRSA by PCR: NEGATIVE

## 2013-09-30 MED ORDER — LORAZEPAM 0.5 MG PO TABS
0.5000 mg | ORAL_TABLET | Freq: Once | ORAL | Status: AC
  Administered 2013-10-01: 0.5 mg via ORAL
  Filled 2013-09-30: qty 1

## 2013-09-30 MED ORDER — FUROSEMIDE 40 MG PO TABS
40.0000 mg | ORAL_TABLET | Freq: Two times a day (BID) | ORAL | Status: DC
Start: 2013-09-30 — End: 2013-10-03
  Administered 2013-09-30 – 2013-10-03 (×7): 40 mg via ORAL
  Filled 2013-09-30 (×10): qty 1

## 2013-09-30 MED ORDER — DEXTROSE 5 % IV SOLN
2.0000 g | Freq: Once | INTRAVENOUS | Status: AC
  Administered 2013-09-30: 2 g via INTRAVENOUS
  Filled 2013-09-30: qty 2

## 2013-09-30 MED ORDER — ACETAMINOPHEN 325 MG PO TABS: 650.0000 mg | ORAL_TABLET | Freq: Four times a day (QID) | ORAL | Status: DC | PRN

## 2013-09-30 MED ORDER — ONDANSETRON HCL 4 MG/2ML IJ SOLN: 4.0000 mg | Freq: Four times a day (QID) | INTRAMUSCULAR | Status: DC | PRN

## 2013-09-30 MED ORDER — ENOXAPARIN SODIUM 40 MG/0.4ML ~~LOC~~ SOLN
40.0000 mg | SUBCUTANEOUS | Status: DC
  Filled 2013-09-30 (×3): qty 0.4

## 2013-09-30 MED ORDER — VANCOMYCIN HCL IN DEXTROSE 1-5 GM/200ML-% IV SOLN
1000.0000 mg | Freq: Once | INTRAVENOUS | Status: DC
  Filled 2013-09-30 (×3): qty 200

## 2013-09-30 MED ORDER — CEFEPIME HCL 2 G IJ SOLR
2.0000 g | Freq: Two times a day (BID) | INTRAMUSCULAR | Status: DC
  Administered 2013-09-30 – 2013-10-03 (×6): 2 g via INTRAVENOUS
  Filled 2013-09-30 (×8): qty 2

## 2013-09-30 MED ORDER — SODIUM CHLORIDE 0.9 % IJ SOLN
10.0000 mL | INTRAMUSCULAR | Status: DC | PRN
  Administered 2013-09-30 – 2013-10-03 (×6): 10 mL
  Administered 2013-10-03: 20 mL

## 2013-09-30 MED ORDER — LEVOTHYROXINE SODIUM 125 MCG PO TABS
125.0000 ug | ORAL_TABLET | Freq: Every day | ORAL | Status: DC
  Administered 2013-10-01 – 2013-10-03 (×3): 125 ug via ORAL
  Filled 2013-09-30 (×5): qty 1

## 2013-09-30 MED ORDER — HYDROCODONE-ACETAMINOPHEN 5-325 MG PO TABS
1.0000 | ORAL_TABLET | Freq: Four times a day (QID) | ORAL | Status: DC | PRN
  Administered 2013-10-01 – 2013-10-02 (×3): 1 via ORAL
  Filled 2013-09-30 (×4): qty 1

## 2013-09-30 MED ORDER — INSULIN ASPART 100 UNIT/ML ~~LOC~~ SOLN
0.0000 [IU] | Freq: Three times a day (TID) | SUBCUTANEOUS | Status: DC
  Administered 2013-10-01: 1 [IU] via SUBCUTANEOUS
  Administered 2013-10-02: 2 [IU] via SUBCUTANEOUS

## 2013-09-30 MED ORDER — ENOXAPARIN SODIUM 40 MG/0.4ML ~~LOC~~ SOLN: 40.0000 mg | SUBCUTANEOUS | Status: DC

## 2013-09-30 MED ORDER — ASPIRIN EC 81 MG PO TBEC
81.0000 mg | DELAYED_RELEASE_TABLET | Freq: Every day | ORAL | Status: DC
  Administered 2013-10-01 – 2013-10-03 (×3): 81 mg via ORAL
  Filled 2013-09-30 (×4): qty 1

## 2013-09-30 MED ORDER — SODIUM CHLORIDE 0.9 % IJ SOLN
3.0000 mL | Freq: Two times a day (BID) | INTRAMUSCULAR | Status: DC
  Administered 2013-09-30 – 2013-10-01 (×2): 3 mL via INTRAVENOUS

## 2013-09-30 MED ORDER — ONDANSETRON HCL 4 MG PO TABS
4.0000 mg | ORAL_TABLET | Freq: Four times a day (QID) | ORAL | Status: DC | PRN
Start: 2013-09-30 — End: 2013-10-03

## 2013-09-30 MED ORDER — PIPERACILLIN-TAZOBACTAM 3.375 G IVPB
3.3750 g | Freq: Once | INTRAVENOUS | Status: AC
  Administered 2013-09-30: 3.375 g via INTRAVENOUS
  Filled 2013-09-30 (×2): qty 50

## 2013-09-30 MED ORDER — IOHEXOL 350 MG/ML SOLN
100.0000 mL | Freq: Once | INTRAVENOUS | Status: AC | PRN
Start: 2013-09-30 — End: 2013-09-30
  Administered 2013-09-30: 100 mL via INTRAVENOUS

## 2013-09-30 MED ORDER — SODIUM CHLORIDE 0.9 % IJ SOLN: 3.0000 mL | Freq: Two times a day (BID) | INTRAMUSCULAR | Status: DC

## 2013-09-30 MED ORDER — ACETAMINOPHEN 650 MG RE SUPP
650.0000 mg | Freq: Four times a day (QID) | RECTAL | Status: DC | PRN
Start: 2013-09-30 — End: 2013-10-03

## 2013-09-30 MED ORDER — LINEZOLID 600 MG PO TABS
600.0000 mg | ORAL_TABLET | Freq: Two times a day (BID) | ORAL | Status: DC
  Administered 2013-09-30 – 2013-10-03 (×7): 600 mg via ORAL
  Filled 2013-09-30 (×8): qty 1

## 2013-09-30 MED ORDER — ALBUTEROL SULFATE (2.5 MG/3ML) 0.083% IN NEBU
2.5000 mg | INHALATION_SOLUTION | RESPIRATORY_TRACT | Status: DC | PRN
  Filled 2013-09-30: qty 3

## 2013-09-30 MED ORDER — SODIUM CHLORIDE 0.9 % IV SOLN: 500.0000 mg | INTRAVENOUS | Status: DC

## 2013-09-30 NOTE — H&P (Signed)
[facility] History and Physical  Amy Burns EVO:[id number] DOB: Aug 19, 1941 DOA: 09/29/2013  Referring physician: ER physician. PCP: Eulas Post, MD  Specialists: Dr. Alvan Dame. Orthopedic surgeon.  Chief Complaint: Shortness of breath.  HPI: Amy Burns is a 73 y.o. female with history of diabetes mellitus type 2, hypothyroidism, hypertension was recent admitted for septic arthritis and hand resection of the right knee joint with placement of antibiotic spacer and presently is on daptomycin for MRSA septic arthritis(patient had renal toxicity to vancomycin) was brought from the rehabilitation after patient was complaining of shortness of breath. Patient states that over the last 2-3 days she has been having some shortness of breath which acutely worsened with some productive cough. Patient has subjective feeling of fever chills. Denies any chest pain. In the ER CT angiogram of the chest done for PE was negative for PE but showed features concerning for developing pneumonia. Patient has been started antibiotics and will be admitted for further management. Patient denies any nausea vomiting abdominal pain or diarrhea.  Review of Systems: As presented in the history of presenting illness, rest negative.  Past Medical History  Diagnosis Date  . HYPOTHYROIDISM 12/11/2008  . 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 HYPERTENSION 12/11/2008  . OSTEOARTHRITIS 12/11/2008  . ALOPECIA 12/11/2008  . Shortness of breath   . Pulmonary embolism     bilateral  . Orthopnea   . Measles     as child  . History of chicken pox     as child  . Cancer     left breast  . Sleep apnea     can not wear CPAP  . Headache(784.0)   . Complication of anesthesia     "woke up after surgery and could not breath"   Past Surgical History  Procedure Laterality Date  . Thyroidectomy, partial  2002  . Breast surgery  2007    cancer  . Knee surgery  2009    left  . Total knee arthroplasty Right 01/30/2013    Procedure: RIGHT  TOTAL KNEE ARTHROPLASTY;  Surgeon: Magnus Sinning, MD;  Location: [facility];  Service: Orthopedics;  Laterality: Right;  . Excisional total knee arthroplasty with antibiotic spacers Right 09/21/2013    Procedure: RIGHT TOTAL KNEE RESECTION WITH ANTIBIOTIC SPACER;  Surgeon: Mauri Pole, MD;  Location: [facility];  Service: Orthopedics;  Laterality: Right;   Social History:  reports that she has never smoked. She has never used smokeless tobacco. She reports that she does not drink alcohol or use illicit drugs. Where does patient live nursing home. Can patient participate in ADLs? No.  Allergies  Allergen Reactions  . Meloxicam     REACTION: itiching    Family History:  Family History  Problem Relation Age of Onset  . Heart disease Father 49  . Cancer Maternal Aunt     breast      Prior to Admission medications   Medication Sig Start Date End Date Taking? Authorizing Provider  acetaminophen (TYLENOL) 500 MG tablet Take 1,000 mg by mouth daily.    Yes Historical Provider, MD  acetaminophen (TYLENOL) 500 MG tablet Take 1,000 mg by mouth at bedtime as needed.   Yes Historical Provider, MD  aspirin 81 MG tablet Take 81 mg by mouth daily.   Yes Historical Provider, MD  enoxaparin (LOVENOX) 40 MG/0.4ML injection Inject 0.4 mLs (40 mg total) into the skin daily. For 3 weeks 09/25/13  Yes Domenic Polite, MD  furosemide (LASIX) 40 MG tablet Take 1  tablet (40 mg total) by mouth 2 (two) times daily. 09/25/13  Yes Domenic Polite, MD  HYDROcodone-acetaminophen (NORCO/VICODIN) 5-325 MG per tablet Take 1 tablet by mouth every 6 (six) hours as needed for moderate pain. 09/26/13  Yes Orson Eva, MD  insulin aspart (NOVOLOG) 100 UNIT/ML injection Inject 1-9 Units into the skin 3 (three) times daily before meals.   Yes Historical Provider, MD  levothyroxine (SYNTHROID, LEVOTHROID) 125 MCG tablet Take 125 mcg by mouth daily before breakfast.   Yes Historical Provider, MD  sodium chloride 0.9 % SOLN 100 mL with  DAPTOmycin 500 MG SOLR 500 mg Inject 500 mg into the vein daily. Till 2/27 09/25/13  Yes Domenic Polite, MD    Physical Exam: Filed Vitals:   09/30/13 0200 09/30/13 0230 09/30/13 0345 09/30/13 0448  BP:    140/46  Pulse: 103 99 105 104  Temp:    98.6 F (37 C)  TempSrc:    Oral  Resp: 16 17 18 23   SpO2: 97% 95% 97% 94%     General:  Well-developed and nourished.  Eyes: Anicteric no pallor.  ENT: No discharge from the eyes nose mouth.  Neck: No mass felt.  Cardiovascular: S1-S2 heard.  Respiratory: No rhonchi or crepitations.  Abdomen: Soft nontender bowel sounds present.  Skin: No rash. Right knee has dressing.  Musculoskeletal: Right knee has dressing.  Psychiatric: Appears normal.  Neurologic: Alert awake oriented to time place and person. Moves all extremities.  Labs on Admission:  Basic Metabolic Panel:  Recent Labs Lab 09/24/13 0642 09/25/13 0430 09/26/13 0308 09/30/13 0038  NA 135* 139 138 137  K 4.9 5.1 4.8 3.7  CL 95* 99 97 96  CO2 28 30 31  32  GLUCOSE 115* 111* 104* 143*  BUN 43* 38* 34* 25*  CREATININE 2.01* 1.70* 1.59* 1.35*  CALCIUM 7.6* 7.9* 7.9* 7.9*  PHOS  --  4.3 3.9  --    Liver Function Tests:  Recent Labs Lab 09/25/13 0430 09/26/13 0308  ALBUMIN 1.6* 1.6*   No results found for this basename: LIPASE, AMYLASE,  in the last 168 hours No results found for this basename: AMMONIA,  in the last 168 hours CBC:  Recent Labs Lab 09/24/13 0642 09/25/13 0430 09/30/13 0038  WBC 6.2 5.8 6.0  HGB 8.6* 8.8* 9.1*  HCT 27.2* 27.1* 29.2*  MCV 92.8 92.5 94.8  PLT 277 257 239   Cardiac Enzymes:  Recent Labs Lab 09/23/13 1310  CKTOTAL 111    BNP (last 3 results)  Recent Labs  09/20/13 0550 09/30/13 0038  PROBNP 1310.0* 2013.0*   CBG:  Recent Labs Lab 09/25/13 1149 09/25/13 1653 09/25/13 2131 09/26/13 0742 09/26/13 1136  GLUCAP 125* 142* 107* 117* 166*    Radiological Exams on Admission: Dg Chest 2 View (if  Patient Has Fever And/or Copd)  09/30/2013   CLINICAL DATA:  Shortness of breath and weakness.  EXAM: CHEST  2 VIEW  COMPARISON:  Chest radiograph September 22, 2013.  FINDINGS: Cardiac silhouette remains enlarged, similar. Mediastinal silhouette is nonsuspicious. Similar central pulmonary vasculature congestion interstitial prominence. Suspected small pleural effusion. Strandy densities in the lung bases. No pneumothorax.  Right PICC distal tip projects in proximal superior vena cava. Large body habitus. Surgical clips project in the left breast. Multiple EKG lines overlie the patient and may obscure subtle underlying pathology. Osseous structures are unchanged.  IMPRESSION: Stable cardiomegaly and interstitial prominence suggesting pulmonary edema with suspected trace pleural effusion.  No apparent change in position  of right PICC.   Electronically Signed   By: Elon Alas   On: 09/30/2013 00:27   Ct Angio Chest Pe W/cm &/or Wo Cm  09/30/2013   CLINICAL DATA:  Recent right knee surgery, shortness of breath and tachycardia.  EXAM: CT ANGIOGRAPHY CHEST WITH CONTRAST  TECHNIQUE: Multidetector CT imaging of the chest was performed using the standard protocol during bolus administration of intravenous contrast. Multiplanar CT image reconstructions including MIPs were obtained to evaluate the vascular anatomy.  CONTRAST:  180mL OMNIPAQUE IOHEXOL 350 MG/ML SOLN  COMPARISON:  Chest radiograph September 30, 2013 and CT of the chest August 07, 2008  FINDINGS: Suboptimal bolus timing, no pulmonary arterial filling defects to at least the segmental branches, limited assessment of the subsegmental pulmonary arteries. Main pulmonary artery is not enlarged.  Heart size is mildly enlarged. Mild mitral annular calcifications. Pericardium is unremarkable. Thoracic esophagus is normal in course and caliber with mild calcific atherosclerosis. 13 mm pretracheal lymph node was 13 mm. Subcentimeter aortopulmonary window lymph  nodes are unchanged. No lymphadenopathy by CT size criteria.  Heterogeneous lung attenuation, with mild tree-in-bud appearance of the right upper lobe, to a lesser extent right middle lobe and right lower lobe as well as the lingula. Patchy ground-glass opacities. Small right pleural effusion with atelectasis bilaterally. Tracheobronchial tree is patent and midline, no pneumothorax.  The included view of the abdomen is nonsuspicious. Status post suspected right thyroidectomy, incompletely characterized. Right PICC in place, distal tip in mid superior vena cava. Osseous structures are nonsuspicious.  Review of the MIP images confirms the above findings.  IMPRESSION: Suboptimal bolus timing without pulmonary embolism to at least the segmental branches.  Tree-in-bud infiltrates with patchy ground-glass opacities and air trapping with small right pleural effusion, constellation of the findings are concerning for bronchiolitis/ early pneumonia.  Stable mediastinal lymphadenopathy.   Electronically Signed   By: Elon Alas   On: 09/30/2013 03:32    EKG: Independently reviewed. Sinus tachycardia.  Assessment/Plan Principal Problem:   HCAP (healthcare-associated pneumonia) Active Problems:   HYPOTHYROIDISM   HYPERTENSION   CHF (congestive heart failure)   Anemia   Pneumonia   1. Shortness of breath most likely secondary developing pneumonia -  At this time patient is on already daptomycin I have continued patient on cefepime. May discuss with infectious disease in a.m. if daptomycin need to be changed to Zyvox. Patient did receive half a dose of vancomycin in the ER which I have discontinued because patient has had recent renal toxicity due to vancomycin. Since patient also has mild pleural effusion and elevated BNP there is concern that patient's symptoms may be also from CHF. Patient is on home dose of Lasix which will be continued. I have also ordered procalcitonin levels to differentiate from CHF  and pneumonia. 2. History of CHF - on Lasix. See #1. Recent 2-D echo showed normal EF. Closely follow intake output and metabolic panel. 3. Recently admitted for septic arthritis of the right knee - cultures grew MRSA. On daptomycin. See #1. Patient was having vancomycin renal toxicity. 4. Recent acute renal failure secondary to vancomycin - creatinine is improving. Closely monitor intake output. 5. Chronic anemia - follow CBC. 6. Hypertension - continue home medications. 7. Hypothyroidism - continue Synthroid. 8. Diabetes mellitus type 2 - on sliding-scale coverage presently. Patient's metformin was discontinued due to the renal failure. Closely follow CBGs.  Due to lack of beds in [facility] long hospital patient will be transferred to [facility]. Patient  is agreeable to transfer. Dr. Alcario Drought will be the accepting physician.    Code Status:  full code.  Family Communication:  none.  Disposition Plan:  admit to inpatient.    Natia Fahmy N. [facility] Pager [phone].  If 7PM-7AM, please contact night-coverage [email or web address] Password [facility] 09/30/2013, 6:27 AM

## 2013-09-30 NOTE — ED Provider Notes (Signed)
CSN: [id number]     Arrival date & time 09/29/13  2336 History   First MD Initiated Contact with Patient 09/30/13 0057     Chief Complaint  Patient presents with  . Shortness of Breath  . Tachycardia  . Anxiety   (Consider location/radiation/quality/duration/timing/severity/associated sxs/prior Treatment) HPI Hx per PT  - [resents from rehab facility, had R knee replacement recently and about 2 weeks was hospitalized for associated infection.  She has PICC in place. She denies any recent F/C. Tonight was at rest. She developed SOB and "feeling hot" tonight, has been persistent, feels better with oxygen. No CP, no leg swelling, no h/o same. Mod in severity.  Past Medical History  Diagnosis Date  . HYPOTHYROIDISM 12/11/2008  . 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 HYPERTENSION 12/11/2008  . OSTEOARTHRITIS 12/11/2008  . ALOPECIA 12/11/2008  . Shortness of breath   . Pulmonary embolism     bilateral  . Orthopnea   . Measles     as child  . History of chicken pox     as child  . Cancer     left breast  . Sleep apnea     can not wear CPAP  . Headache(784.0)   . Complication of anesthesia     "woke up after surgery and could not breath"   Past Surgical History  Procedure Laterality Date  . Thyroidectomy, partial  2002  . Breast surgery  2007    cancer  . Knee surgery  2009    left  . Total knee arthroplasty Right 01/30/2013    Procedure: RIGHT TOTAL KNEE ARTHROPLASTY;  Surgeon: Magnus Sinning, MD;  Location: [facility];  Service: Orthopedics;  Laterality: Right;  . Excisional total knee arthroplasty with antibiotic spacers Right 09/21/2013    Procedure: RIGHT TOTAL KNEE RESECTION WITH ANTIBIOTIC SPACER;  Surgeon: Mauri Pole, MD;  Location: [facility];  Service: Orthopedics;  Laterality: Right;   Family History  Problem Relation Age of Onset  . Heart disease Father 29  . Cancer Maternal Aunt     breast   History  Substance Use Topics  . Smoking status: Never Smoker   . Smokeless tobacco: Never Used   . Alcohol Use: No   OB History   Grav Para Term Preterm Abortions TAB SAB Ect Mult Living                 Review of Systems  Constitutional: Negative for fever and chills.  Eyes: Negative for pain.  Respiratory: Positive for shortness of breath. Negative for cough.   Cardiovascular: Negative for chest pain.  Gastrointestinal: Negative for abdominal pain.  Genitourinary: Negative for dysuria.  Musculoskeletal: Negative for back pain, neck pain and neck stiffness.  Skin: Negative for rash.  Neurological: Negative for headaches.  All other systems reviewed and are negative.    Allergies  Meloxicam  Home Medications   Current Outpatient Rx  Name  Route  Sig  Dispense  Refill  . acetaminophen (TYLENOL) 500 MG tablet   Oral   Take 1,000 mg by mouth daily.          Marland Kitchen acetaminophen (TYLENOL) 500 MG tablet   Oral   Take 1,000 mg by mouth at bedtime as needed.         Marland Kitchen aspirin 81 MG tablet   Oral   Take 81 mg by mouth daily.         Marland Kitchen enoxaparin (LOVENOX) 40 MG/0.4ML injection   Subcutaneous   Inject 0.4  mLs (40 mg total) into the skin daily. For 3 weeks   20 Syringe   0   . furosemide (LASIX) 40 MG tablet   Oral   Take 1 tablet (40 mg total) by mouth 2 (two) times daily.   30 tablet   0   . HYDROcodone-acetaminophen (NORCO/VICODIN) 5-325 MG per tablet   Oral   Take 1 tablet by mouth every 6 (six) hours as needed for moderate pain.   30 tablet   0   . insulin aspart (NOVOLOG) 100 UNIT/ML injection   Subcutaneous   Inject 1-9 Units into the skin 3 (three) times daily before meals.         Marland Kitchen levothyroxine (SYNTHROID, LEVOTHROID) 125 MCG tablet   Oral   Take 125 mcg by mouth daily before breakfast.         . sodium chloride 0.9 % SOLN 100 mL with DAPTOmycin 500 MG SOLR 500 mg   Intravenous   Inject 500 mg into the vein daily. Till 2/27          BP 150/55  Pulse 111  Temp(Src) 98.3 F (36.8 C) (Oral)  Resp 29  SpO2 96% Physical Exam   Constitutional: She is oriented to person, place, and time. She appears well-developed and well-nourished.  HENT:  Head: Normocephalic and atraumatic.  Eyes: EOM are normal. Pupils are equal, round, and reactive to light.  Neck: Neck supple.  Cardiovascular: Regular rhythm and intact distal pulses.   Tachycardic  Pulmonary/Chest: Effort normal. No respiratory distress.  Tachypnea with decreased bilateral breath sounds  Abdominal: Soft. Bowel sounds are normal. She exhibits no distension. There is no tenderness.  Musculoskeletal: Normal range of motion. She exhibits no edema and no tenderness.  Neurological: She is alert and oriented to person, place, and time.  Skin: Skin is warm and dry.    ED Course  Procedures (including critical care time) Labs Review Labs Reviewed  CBC - Abnormal; Notable for the following:    RBC 3.08 (*)    Hemoglobin 9.1 (*)    HCT 29.2 (*)    RDW 15.6 (*)    All other components within normal limits  BASIC METABOLIC PANEL   Imaging Review Dg Chest 2 View (if Patient Has Fever And/or Copd)  09/30/2013   CLINICAL DATA:  Shortness of breath and weakness.  EXAM: CHEST  2 VIEW  COMPARISON:  Chest radiograph September 22, 2013.  FINDINGS: Cardiac silhouette remains enlarged, similar. Mediastinal silhouette is nonsuspicious. Similar central pulmonary vasculature congestion interstitial prominence. Suspected small pleural effusion. Strandy densities in the lung bases. No pneumothorax.  Right PICC distal tip projects in proximal superior vena cava. Large body habitus. Surgical clips project in the left breast. Multiple EKG lines overlie the patient and may obscure subtle underlying pathology. Osseous structures are unchanged.  IMPRESSION: Stable cardiomegaly and interstitial prominence suggesting pulmonary edema with suspected trace pleural effusion.  No apparent change in position of right PICC.   Electronically Signed   By: Elon Alas   On: 09/30/2013 00:27     EKG Interpretation    Date/Time:  Saturday September 29 2013 23:44:30 EST Ventricular Rate:  108 PR Interval:  158 QRS Duration: 78 QT Interval:  348 QTC Calculation: 466 R Axis:   20 Text Interpretation:  Sinus tachycardia Nonspecific ST abnormality No significant change since last tracing Confirmed by Shabre Kreher  MD, [place] ([id number]) on 09/30/2013 1:01:48 AM           2 L  oxygen provided - patient still tachypnea and is not hypoxic, subjectively feeling better  IV antibiotics to cover healthcare associated pneumonia  Discussed with [facility], no telemetry beds available at this hospital, requires transfer MDM  Diagnosis: HCAP  EKG, chest x-ray, CT scan, labs Medications provided Admit  Teressa Lower, MD 09/30/13 [phone]

## 2013-09-30 NOTE — Progress Notes (Signed)
Pt was seen and examined.  H&P and orders were reviewed.  Labs reviewed.  Murvin Natal, MD

## 2013-09-30 NOTE — Progress Notes (Signed)
ANTIBIOTIC CONSULT NOTE - INITIAL  Pharmacy Consult for Cefepime Indication: pneumonia  Allergies  Allergen Reactions  . Meloxicam     REACTION: itiching    Patient Measurements: Height: 5\' 2"  (157.5 cm) Weight: 300 lb (136.079 kg) IBW/kg (Calculated) : 50.1 Adjusted Body Weight:   Vital Signs: Temp: 98.6 F (37 C) (01/25 0448) Temp src: Oral (01/25 0448) BP: 140/46 mmHg (01/25 0448) Pulse Rate: 104 (01/25 0448) Intake/Output from previous day:   Intake/Output from this shift:    Labs:  Recent Labs  09/30/13 0038  WBC 6.0  HGB 9.1*  PLT 239  CREATININE 1.35*   Estimated Creatinine Clearance: 49.5 ml/min (by C-G formula based on Cr of 1.35). No results found for this basename: VANCOTROUGH, VANCOPEAK, VANCORANDOM, GENTTROUGH, GENTPEAK, GENTRANDOM, TOBRATROUGH, TOBRAPEAK, TOBRARND, AMIKACINPEAK, AMIKACINTROU, AMIKACIN,  in the last 72 hours   Microbiology: Recent Results (from the past 720 hour(s))  ANAEROBIC CULTURE     Status: None   Collection Time    09/13/13  8:00 PM      Result Value Range Status   Specimen Description KNEE RIGHT KNEE JOINT   Final   Special Requests ANAC ONLY   Final   Gram Stain     Final   Value: ABUNDANT WBC PRESENT,BOTH PMN AND MONONUCLEAR     NO SQUAMOUS EPITHELIAL CELLS SEEN     MODERATE GRAM POSITIVE COCCI     IN PAIRS IN CLUSTERS     Performed at [facility]   Culture     Final   Value: NO ANAEROBES ISOLATED     Performed at [facility]   Report Status 09/19/2013 FINAL   Final  BODY FLUID CULTURE     Status: None   Collection Time    09/14/13  4:27 PM      Result Value Range Status   Specimen Description KNEE   Final   Special Requests NONE   Final   Gram Stain     Final   Value: ABUNDANT WBC PRESENT,BOTH PMN AND MONONUCLEAR     MODERATE GRAM POSITIVE COCCI     IN PAIRS IN CLUSTERS Gram Stain Report Called to,Read Back By and Verified With: Gram Stain Report Called to,Read Back By and Verified With:  DONNA A @1131  ON 09/15/13 BY SMIAS     Performed at [facility]   Culture     Final   Value: ABUNDANT METHICILLIN RESISTANT STAPHYLOCOCCUS AUREUS     Note: RIFAMPIN AND GENTAMICIN SHOULD NOT BE USED AS SINGLE DRUGS FOR TREATMENT OF STAPH INFECTIONS. CRITICAL RESULT CALLED TO, READ BACK BY AND VERIFIED WITH: CAITLYN TAYLOR 09/16/13 @ 9:12AM BY RUSCOE A.     Performed at [facility]   Report Status 09/16/2013 FINAL   Final   Organism ID, Bacteria METHICILLIN RESISTANT STAPHYLOCOCCUS AUREUS   Final  SURGICAL PCR SCREEN     Status: Abnormal   Collection Time    09/16/13  4:47 PM      Result Value Range Status   MRSA, PCR POSITIVE (*) NEGATIVE Final   Comment: RESULT CALLED TO, READ BACK BY AND VERIFIED WITH:     K.BUTCHEE AT 1818 ON [id number] BY C.BONGEL   Staphylococcus aureus POSITIVE (*) NEGATIVE Final   Comment:            The Xpert SA Assay (FDA     approved for NASAL specimens     in patients over 35 years of age),  is one component of     a comprehensive surveillance     program.  Test performance has     been validated by [facility] for patients greater     than or equal to 75 year old.     It is not intended     to diagnose infection nor to     guide or monitor treatment.  MRSA PCR SCREENING     Status: Abnormal   Collection Time    09/17/13  7:33 AM      Result Value Range Status   MRSA by PCR POSITIVE (*) NEGATIVE Final   Comment:            The GeneXpert MRSA Assay (FDA     approved for NASAL specimens     only), is one component of a     comprehensive MRSA colonization     surveillance program. It is not     intended to diagnose MRSA     infection nor to guide or     monitor treatment for     MRSA infections.     RESULT CALLED TO, READ BACK BY AND VERIFIED WITH:     E. MACABUAG RN AT 0940 ON 1.12.15 BY SHUEA  URINE CULTURE     Status: None   Collection Time    09/22/13  3:53 PM      Result Value Range Status   Specimen Description  URINE, CLEAN CATCH   Final   Special Requests NONE   Final   Culture  Setup Time     Final   Value: 09/22/2013 22:44     Performed at [facility]     Final   Value: >=100,000 COLONIES/ML     Performed at [facility]   Culture     Final   Value: YEAST     Performed at [facility]   Report Status 09/23/2013 FINAL   Final    Medical History: Past Medical History  Diagnosis Date  . HYPOTHYROIDISM 12/11/2008  . 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 HYPERTENSION 12/11/2008  . OSTEOARTHRITIS 12/11/2008  . ALOPECIA 12/11/2008  . Shortness of breath   . Pulmonary embolism     bilateral  . Orthopnea   . Measles     as child  . History of chicken pox     as child  . Cancer     left breast  . Sleep apnea     can not wear CPAP  . Headache(784.0)   . Complication of anesthesia     "woke up after surgery and could not breath"    Medications:  Anti-infectives   Start     Dose/Rate Route Frequency Ordered Stop   09/30/13 1800  ceFEPIme (MAXIPIME) 2 g in dextrose 5 % 50 mL IVPB     2 g 100 mL/hr over 30 Minutes Intravenous Every 12 hours 09/30/13 0713     09/30/13 0645  ceFEPIme (MAXIPIME) 2 g in dextrose 5 % 50 mL IVPB     2 g 100 mL/hr over 30 Minutes Intravenous  Once 09/30/13 0640     09/30/13 0445  vancomycin (VANCOCIN) IVPB 1000 mg/200 mL premix  Status:  Discontinued     1,000 mg 200 mL/hr over 60 Minutes Intravenous  Once 09/30/13 0431 09/30/13 0559   09/30/13 0445  piperacillin-tazobactam (ZOSYN) IVPB 3.375 g     3.375 g 100 mL/hr over 30  Minutes Intravenous  Once 09/30/13 0431 09/30/13 0538     Assessment: Patient with PNA.  First dose of antibiotics already given in ED.  Patient to transfer to [facility].  Goal of Therapy:  Cefepime dosed based on patient weight and renal function   Plan:  Follow up culture results Cefepime 2gm iv q12hr Call [facility] to alert about transfer and possible daptomycin/zyvox for patient  Nani Skillern  [place] 09/30/2013,7:14 AM

## 2013-09-30 NOTE — ED Notes (Signed)
Attempted to call floor for report, receiving Nurse Elmo Putt, in the middle of passing meds., to call back once available.

## 2013-09-30 NOTE — ED Notes (Signed)
Patient transported to CT 

## 2013-09-30 NOTE — ED Notes (Signed)
MD at bedside. 

## 2013-09-30 NOTE — Consult Note (Signed)
[facility]    Date of Admission:  09/29/2013   Total days of antibiotics 18        Day 8 daptomycin        Day 1 cefepime               Reason for Consult: Healthcare associated pneumonia superimposed on recent MRSA right knee prosthetic joint infection    Referring Physician: Dr. Gean Birchwood   Principal Problem:   HCAP (healthcare-associated pneumonia) Active Problems:   Infection of prosthetic right knee joint   HYPOTHYROIDISM   AODM   HYPERTENSION   OSTEOARTHRITIS   Morbid obesity with BMI of 08.1-44.8, adult   Diastolic CHF, acute on chronic   CHF (congestive heart failure)   Anemia   . aspirin EC  81 mg Oral Daily  . ceFEPime (MAXIPIME) IV  2 g Intravenous Q12H  . DAPTOmycin (CUBICIN)  IV  500 mg Intravenous Q24H  . enoxaparin (LOVENOX) injection  40 mg Subcutaneous Q24H  . furosemide  40 mg Oral BID  . insulin aspart  0-9 Units Subcutaneous TID WC  . [START ON 10/01/2013] levothyroxine  125 mcg Oral QAC breakfast  . sodium chloride  3 mL Intravenous Q12H  . sodium chloride  3 mL Intravenous Q12H    Recommendations: 1. Continue cefepime 2. Change daptomycin to linezolid   Assessment: It looks as though she may have developed healthcare associated pneumonia. I agree with the addition of cefepime. Daptomycin does not provide a good tissue levels and long so I will change to linezolid.   HPI: Amy Burns is a 73 y.o. female who was hospitalized recently with right knee prosthetic joint infection. She underwent resection arthroplasty with spacer placement on January 16. Operative cultures grew MRSA. She was treated initially with IV vancomycin but developed some renal insufficiency and rash and was switched to IV daptomycin. She was discharged to a skilled nursing facility. She's had subjective fevers and chills with drenching sweats over the past week associated with shortness of breath and cough. Initially her cough  was productive but more recently has been a dry cough. She was readmitted last night.   Review of Systems: Pertinent items are noted in HPI.  Past Medical History  Diagnosis Date  . HYPOTHYROIDISM 12/11/2008  . 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 HYPERTENSION 12/11/2008  . OSTEOARTHRITIS 12/11/2008  . ALOPECIA 12/11/2008  . Shortness of breath   . Pulmonary embolism     bilateral  . Orthopnea   . Measles     as child  . History of chicken pox     as child  . Cancer     left breast  . Sleep apnea     can not wear CPAP  . Headache(784.0)   . Complication of anesthesia     "woke up after surgery and could not breath"    History  Substance Use Topics  . Smoking status: Never Smoker   . Smokeless tobacco: Never Used  . Alcohol Use: No    Family History  Problem Relation Age of Onset  . Heart disease Father 85  . Cancer Maternal Aunt     breast   Allergies  Allergen Reactions  . Meloxicam     REACTION: itiching    OBJECTIVE: Blood pressure 146/66, pulse 96, temperature 97.8 F (36.6 C), temperature source Oral, resp. rate 20, height 5\' 2"  (1.575 m), weight 129.3 kg (285 lb 0.9  oz), SpO2 98.00%. General: She is pale and slightly anxious Skin: Right arm PICC appears normal Lungs: Scattered wheezing Cor: Regular S1 and S2 no murmurs Abdomen: Obese, soft and nontender Right knee incision with staples in is clean and dry with no evidence of active infection  Lab Results Lab Results  Component Value Date   WBC 4.9 09/30/2013   HGB 8.6* 09/30/2013   HCT 26.5* 09/30/2013   MCV 94.0 09/30/2013   PLT 224 09/30/2013    Lab Results  Component Value Date   CREATININE 1.34* 09/30/2013   BUN 25* 09/30/2013   NA 137 09/30/2013   K 3.7 09/30/2013   CL 96 09/30/2013   CO2 32 09/30/2013    Lab Results  Component Value Date   ALT 19 08/17/2012   AST 32 08/17/2012   ALKPHOS 94 08/17/2012   BILITOT 0.8 08/17/2012     Microbiology: Recent Results (from the past 240 hour(s))  URINE CULTURE      Status: None   Collection Time    09/22/13  3:53 PM      Result Value Range Status   Specimen Description URINE, CLEAN CATCH   Final   Special Requests NONE   Final   Culture  Setup Time     Final   Value: 09/22/2013 22:44     Performed at [facility]     Final   Value: >=100,000 COLONIES/ML     Performed at [facility]   Culture     Final   Value: YEAST     Performed at [facility]   Report Status 09/23/2013 FINAL   Final  MRSA PCR SCREENING     Status: None   Collection Time    09/30/13  9:13 AM      Result Value Range Status   MRSA by PCR NEGATIVE  NEGATIVE Final   Comment:            The GeneXpert MRSA Assay (FDA     approved for NASAL specimens     only), is one component of a     comprehensive MRSA colonization     surveillance program. It is not     intended to diagnose MRSA     infection nor to guide or     monitor treatment for     MRSA infections.    Michel Bickers, MD [facility] [place] [phone] pager   [phone] cell 09/30/2013, 12:36 PM

## 2013-10-01 ENCOUNTER — Inpatient Hospital Stay (HOSPITAL_COMMUNITY): Payer: Medicare Other

## 2013-10-01 DIAGNOSIS — N179 Acute kidney failure, unspecified: Secondary | ICD-10-CM

## 2013-10-01 DIAGNOSIS — N898 Other specified noninflammatory disorders of vagina: Secondary | ICD-10-CM

## 2013-10-01 DIAGNOSIS — N950 Postmenopausal bleeding: Secondary | ICD-10-CM | POA: Diagnosis present

## 2013-10-01 LAB — CBC
HEMATOCRIT: 26 % — AB (ref 36.0–46.0)
Hemoglobin: 8.5 g/dL — ABNORMAL LOW (ref 12.0–15.0)
MCH: 31 pg (ref 26.0–34.0)
MCHC: 32.7 g/dL (ref 30.0–36.0)
MCV: 94.9 fL (ref 78.0–100.0)
Platelets: 192 10*3/uL (ref 150–400)
RBC: 2.74 MIL/uL — ABNORMAL LOW (ref 3.87–5.11)
RDW: 16 % — AB (ref 11.5–15.5)
WBC: 4.7 10*3/uL (ref 4.0–10.5)

## 2013-10-01 LAB — BASIC METABOLIC PANEL
BUN: 27 mg/dL — ABNORMAL HIGH (ref 6–23)
CHLORIDE: 96 meq/L (ref 96–112)
CO2: 33 mEq/L — ABNORMAL HIGH (ref 19–32)
Calcium: 7.7 mg/dL — ABNORMAL LOW (ref 8.4–10.5)
Creatinine, Ser: 1.29 mg/dL — ABNORMAL HIGH (ref 0.50–1.10)
GFR calc Af Amer: 46 mL/min — ABNORMAL LOW (ref >90)
GFR, EST NON AFRICAN AMERICAN: 40 mL/min — AB (ref >90)
Glucose, Bld: 121 mg/dL — ABNORMAL HIGH (ref 70–99)
POTASSIUM: 3.4 meq/L — AB (ref 3.7–5.3)
Sodium: 139 mEq/L (ref 137–147)

## 2013-10-01 LAB — GLUCOSE, CAPILLARY
GLUCOSE-CAPILLARY: 112 mg/dL — AB (ref 70–99)
Glucose-Capillary: 112 mg/dL — ABNORMAL HIGH (ref 70–99)
Glucose-Capillary: 119 mg/dL — ABNORMAL HIGH (ref 70–99)
Glucose-Capillary: 146 mg/dL — ABNORMAL HIGH (ref 70–99)
Glucose-Capillary: 152 mg/dL — ABNORMAL HIGH (ref 70–99)

## 2013-10-01 MED ORDER — ADULT MULTIVITAMIN W/MINERALS CH
1.0000 | ORAL_TABLET | Freq: Every day | ORAL | Status: DC
  Administered 2013-10-01 – 2013-10-03 (×3): 1 via ORAL
  Filled 2013-10-01 (×3): qty 1

## 2013-10-01 MED ORDER — PANTOPRAZOLE SODIUM 40 MG PO TBEC
40.0000 mg | DELAYED_RELEASE_TABLET | Freq: Every day | ORAL | Status: DC
  Administered 2013-10-01 – 2013-10-03 (×2): 40 mg via ORAL
  Filled 2013-10-01 (×3): qty 1

## 2013-10-01 MED ORDER — LEVALBUTEROL HCL 1.25 MG/0.5ML IN NEBU
1.2500 mg | INHALATION_SOLUTION | Freq: Once | RESPIRATORY_TRACT | Status: AC
  Administered 2013-10-01: 1.25 mg via RESPIRATORY_TRACT
  Filled 2013-10-01: qty 0.5

## 2013-10-01 MED ORDER — ENSURE PUDDING PO PUDG
1.0000 | ORAL | Status: DC
  Administered 2013-10-01: 1 via ORAL

## 2013-10-01 MED ORDER — GLUCERNA SHAKE PO LIQD: 237.0000 mL | Freq: Every day | ORAL | Status: DC | PRN

## 2013-10-01 MED ORDER — LORAZEPAM 1 MG PO TABS
1.0000 mg | ORAL_TABLET | Freq: Once | ORAL | Status: AC
  Administered 2013-10-01: 1 mg via ORAL
  Filled 2013-10-01: qty 1

## 2013-10-01 MED ORDER — LEVALBUTEROL HCL 0.63 MG/3ML IN NEBU: 0.6300 mg | INHALATION_SOLUTION | RESPIRATORY_TRACT | Status: DC | PRN

## 2013-10-01 MED ORDER — POTASSIUM CHLORIDE CRYS ER 20 MEQ PO TBCR
40.0000 meq | EXTENDED_RELEASE_TABLET | Freq: Two times a day (BID) | ORAL | Status: DC
  Administered 2013-10-01 – 2013-10-03 (×5): 40 meq via ORAL
  Filled 2013-10-01 (×7): qty 2

## 2013-10-01 NOTE — Progress Notes (Signed)
INITIAL NUTRITION ASSESSMENT  DOCUMENTATION CODES Per approved criteria  -Morbid Obesity   INTERVENTION: Provide a snacks BID Provide Ensure Pudding once daily Provide Glucerna Shakes once daily Provide Multivitamin with minerals daily  NUTRITION DIAGNOSIS: Inadequate oral intake related to poor appetite and taste changes as evidenced by meal completion <50% of meals.   Goal: Pt to meet >/= 90% of their estimated nutrition needs   Monitor:  PO intake Weight Labs   Reason for Assessment: MAOI  73 y.o. female  Admitting Dx: HCAP (healthcare-associated pneumonia)  ASSESSMENT: 73 y.o. female with history of diabetes mellitus type 2, hypothyroidism, hypertension was recent admitted for septic arthritis and hand resection of the right knee joint with placement of antibiotic spacer and presently is on daptomycin for MRSA septic arthritis(patient had renal toxicity to vancomycin) was brought from the rehabilitation after patient was complaining of shortness of breath.  RD drawn to chart secondary to current prescription of Zyvox, a medication with potential interactions with high tyramine-containing foods.  Hospitalized diet is not high in tyramine and poses no risk for patient at this time. Discussed foods for pt to avoid if she is to be discharged on medication.   Pt reports poor appetite and taste changes at time of visit. She reports eating <50% of her meals since being admitted for repeat knee surgery 09/13/12. Pt reports she has been forcing herself to eat and she focuses on eating protein-rich foods first at meals. Per nursing notes, pt is eating 45 to 100% of meals. Weight history shows that pt has lost 6% of her body weight within the past month.    Height: Ht Readings from Last 1 Encounters:  09/30/13 5\' 2"  (1.575 m)    Weight: Wt Readings from Last 1 Encounters:  10/01/13 282 lb 10.1 oz (128.2 kg)    Ideal Body Weight: 110 lbs  % Ideal Body Weight: 256%  Wt  Readings from Last 10 Encounters:  10/01/13 282 lb 10.1 oz (128.2 kg)  09/26/13 300 lb 0.7 oz (136.1 kg)  09/26/13 300 lb 0.7 oz (136.1 kg)  09/26/13 300 lb 0.7 oz (136.1 kg)  04/27/13 295 lb (133.811 kg)  01/30/13 300 lb (136.079 kg)  01/30/13 300 lb (136.079 kg)  01/25/13 302 lb (136.986 kg)  01/22/13 300 lb (136.079 kg)  08/17/12 302 lb 8 oz (137.213 kg)    Usual Body Weight: 300 lbs  % Usual Body Weight: 94%  BMI:  Body mass index is 51.68 kg/(m^2).  Estimated Nutritional Needs: Kcal: 1800-2000 Protein: 105-120 grams Fluid: 3.1 L/day  Skin: +1 RLE and LLE edema; incision on right leg; wound on right abdomen  Diet Order: Carb Control  EDUCATION NEEDS: -No education needs identified at this time   Intake/Output Summary (Last 24 hours) at 10/01/13 1328 Last data filed at 10/01/13 1100  Gross per 24 hour  Intake    700 ml  Output    678 ml  Net     22 ml    Last BM: 1/25   Labs:   Recent Labs Lab 09/25/13 0430 09/26/13 0308 09/30/13 0038 09/30/13 1030 10/01/13 0454  NA 139 138 137  --  139  K 5.1 4.8 3.7  --  3.4*  CL 99 97 96  --  96  CO2 30 31 32  --  33*  BUN 38* 34* 25*  --  27*  CREATININE 1.70* 1.59* 1.35* 1.34* 1.29*  CALCIUM 7.9* 7.9* 7.9*  --  7.7*  PHOS 4.3 3.9  --   --   --  GLUCOSE 111* 104* 143*  --  121*    CBG (last 3)   Recent Labs  09/30/13 2114 10/01/13 0610 10/01/13 1223  GLUCAP 112* 112* 119*    Scheduled Meds: . aspirin EC  81 mg Oral Daily  . ceFEPime (MAXIPIME) IV  2 g Intravenous Q12H  . furosemide  40 mg Oral BID  . insulin aspart  0-9 Units Subcutaneous TID WC  . levothyroxine  125 mcg Oral QAC breakfast  . linezolid  600 mg Oral BID  . potassium chloride  40 mEq Oral BID  . sodium chloride  3 mL Intravenous Q12H  . sodium chloride  3 mL Intravenous Q12H    Continuous Infusions:   Past Medical History  Diagnosis Date  . HYPOTHYROIDISM 12/11/2008  . 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 HYPERTENSION 12/11/2008  .  OSTEOARTHRITIS 12/11/2008  . ALOPECIA 12/11/2008  . Shortness of breath   . Pulmonary embolism     bilateral  . Orthopnea   . Measles     as child  . History of chicken pox     as child  . Cancer     left breast  . Sleep apnea     can not wear CPAP  . Headache(784.0)   . Complication of anesthesia     "woke up after surgery and could not breath"    Past Surgical History  Procedure Laterality Date  . Thyroidectomy, partial  2002  . Breast surgery  2007    cancer  . Knee surgery  2009    left  . Total knee arthroplasty Right 01/30/2013    Procedure: RIGHT TOTAL KNEE ARTHROPLASTY;  Surgeon: Magnus Sinning, MD;  Location: [facility];  Service: Orthopedics;  Laterality: Right;  . Excisional total knee arthroplasty with antibiotic spacers Right 09/21/2013    Procedure: RIGHT TOTAL KNEE RESECTION WITH ANTIBIOTIC SPACER;  Surgeon: Mauri Pole, MD;  Location: [facility];  Service: Orthopedics;  Laterality: Right;    Pryor Ochoa RD, LDN Inpatient Clinical Dietitian Pager: [phone] After Hours Pager: [phone]

## 2013-10-01 NOTE — Consult Note (Signed)
[facility] Consult: 3:18 PM 10/01/2013  LOS: 2 days    Referring Provider: Dr Irwin Brakeman hospitalist  Primary Care Physician:  Eulas Post, MD Primary Gastroenterologist:  Lady Gary    Reason for Consultation:  Tarry stool    [place]    HPI: Amy Burns is a 73 y.o. female.  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 Previous uneventful left TKR.   Sp right TKR in 01/2013.  S/p removal of the knee hardware and placement of antibiotic spacer 09/13/14 at [facility] for mgt of MRSA knee infection.  Post op course complicated by renal insufficiency, supratherapeutic Vancomycin levels toxic to the kidneys,   Rash on arms (? Drug reaction).  Admission was 1/8 - 1/20.  Discharged to SNF on Daptomycin, lovenox.   Readmitted 1/24 to [facility] with dyspnea and xray concerning for developing PNA.  + heat intolerance.  Ct angio of chest is negative for PE  Noted to have anemia with normal MCV.  Baseline Hgb is 9 to 11 going back to May 2014 and she is labelled as having anemia of chronic disease.  Hgb was 8.8 to 10.6 during the recent admission.  Currently it is 8.5 to 9.1.  Stools said to be tarry, she has not been getting any iron according to the records.  She was briefly constipated during recent admission, this is resolved.  By my exam today she is FOB negative. She has been passing clots of blood when she urinates as well as with BMs during current admission.  Staff not sure if this is vaginal and/or rectal bleeding. Pelvic ultrasound  Reveals endometrial thickening and suspicious endometrial nodule.  Will need biopsy to rule out cancer.   Never had colonoscopy and if she had EGD it was many years ago, no records as to any GI workup.   Appetite good.  Weight stable.  No bleeding per  rectum though does have large hemorrhoids.   No nausea or vomiting.  No previous transfusions.  Was ambulating without cane or walker before developing knee pain in early January.  Family hx negative for colon cancer, gi bleed, liver disease.     Past Medical History  Diagnosis Date  . HYPOTHYROIDISM 12/11/2008  . 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 HYPERTENSION 12/11/2008  . OSTEOARTHRITIS 12/11/2008  . ALOPECIA 12/11/2008  . Shortness of breath   . Pulmonary embolism     bilateral  . Orthopnea   . Measles     as child  . History of chicken pox     as child  . Cancer     left breast  . Sleep apnea     can not wear CPAP  . Headache(784.0)   . Complication of anesthesia     "woke up after surgery and could not breath"    Past Surgical History  Procedure Laterality Date  . Thyroidectomy, partial  2002  . Breast surgery  2007    cancer  . Knee surgery  2009    left  . Total knee arthroplasty Right 01/30/2013  Procedure: RIGHT TOTAL KNEE ARTHROPLASTY;  Surgeon: Magnus Sinning, MD;  Location: [facility];  Service: Orthopedics;  Laterality: Right;  . Excisional total knee arthroplasty with antibiotic spacers Right 09/21/2013    Procedure: RIGHT TOTAL KNEE RESECTION WITH ANTIBIOTIC SPACER;  Surgeon: Mauri Pole, MD;  Location: [facility];  Service: Orthopedics;  Laterality: Right;    Prior to Admission medications   Medication Sig Start Date End Date Taking? Authorizing Provider  acetaminophen (TYLENOL) 500 MG tablet Take 1,000 mg by mouth daily.    Yes Historical Provider, MD  acetaminophen (TYLENOL) 500 MG tablet Take 1,000 mg by mouth at bedtime as needed.   Yes Historical Provider, MD  aspirin 81 MG tablet Take 81 mg by mouth daily.   Yes Historical Provider, MD  enoxaparin (LOVENOX) 40 MG/0.4ML injection Inject 0.4 mLs (40 mg total) into the skin daily. For 3 weeks 09/25/13  Yes Domenic Polite, MD  furosemide (LASIX) 40 MG tablet Take 1 tablet (40 mg total) by mouth 2 (two) times daily. 09/25/13  Yes  Domenic Polite, MD  HYDROcodone-acetaminophen (NORCO/VICODIN) 5-325 MG per tablet Take 1 tablet by mouth every 6 (six) hours as needed for moderate pain. 09/26/13  Yes Orson Eva, MD  insulin aspart (NOVOLOG) 100 UNIT/ML injection Inject 1-9 Units into the skin 3 (three) times daily before meals.   Yes Historical Provider, MD  levothyroxine (SYNTHROID, LEVOTHROID) 125 MCG tablet Take 125 mcg by mouth daily before breakfast.   Yes Historical Provider, MD  sodium chloride 0.9 % SOLN 100 mL with DAPTOmycin 500 MG SOLR 500 mg Inject 500 mg into the vein daily. Till 2/27 09/25/13  Yes Domenic Polite, MD    Scheduled Meds: . aspirin EC  81 mg Oral Daily  . ceFEPime (MAXIPIME) IV  2 g Intravenous Q12H  . feeding supplement (ENSURE)  1 Container Oral Q24H  . furosemide  40 mg Oral BID  . insulin aspart  0-9 Units Subcutaneous TID WC  . levothyroxine  125 mcg Oral QAC breakfast  . linezolid  600 mg Oral BID  . multivitamin with minerals  1 tablet Oral Daily  . pantoprazole  40 mg Oral Q0600  . potassium chloride  40 mEq Oral BID  . sodium chloride  3 mL Intravenous Q12H  . sodium chloride  3 mL Intravenous Q12H   Infusions:   PRN Meds: acetaminophen, acetaminophen, albuterol, feeding supplement (GLUCERNA SHAKE), HYDROcodone-acetaminophen, ondansetron (ZOFRAN) IV, ondansetron, sodium chloride   Allergies as of 09/29/2013 - Review Complete 09/29/2013  Allergen Reaction Noted  . Meloxicam  05/06/2010    Family History  Problem Relation Age of Onset  . Heart disease Father 47  . Cancer Maternal Aunt     breast    History   Social History  . Marital Status: Married    Spouse Name: N/A    Number of Children: N/A  . Years of Education: N/A   Occupational History  . Not on file.   Social History Main Topics  . Smoking status: Never Smoker   . Smokeless tobacco: Never Used  . Alcohol Use: No  . Drug Use: No  . Sexual Activity: No   Other Topics Concern  . Not on file   Social  History Narrative  . No narrative on file    REVIEW OF SYSTEMS: Constitutional:  Per HPI ENT:  No nose bleeds Pulm:  + DOE, 3 pillow orthopnea CV:  No palpitations, no LE edema.  GU:  No hematuria, no frequency  GI:  Dysphagia only with large pills.  Heme:  Never told she was anemic   Transfusions:  None ever Neuro:  No headaches, no peripheral tingling or numbness Derm:  No itching, no rash or sores.  Endocrine:  No sweats or chills.  No polyuria or dysuria Immunization:  Flu shot up todate.  Travel:  None beyond local counties in last few months.    PHYSICAL EXAM: Vital signs in last 24 hours: Filed Vitals:   10/01/13 1300  BP: 140/52  Pulse: 90  Temp: 98 F (36.7 C)  Resp: 20   Wt Readings from Last 3 Encounters:  10/01/13 128.2 kg (282 lb 10.1 oz)  09/26/13 136.1 kg (300 lb 0.7 oz)  09/26/13 136.1 kg (300 lb 0.7 oz)   General: morbidly obese, dyspneic wf.  Hard to believe she was walking unassisted 4 weeks ago. Head:  No swelling or asymmetry  Eyes:  No icterus or pallor Ears:  Not HOH  Nose:  No congestion Mouth:  Moist, clear oral mucosa Neck:  No JVD or bruits Lungs:  dyspnea with speech.  No cough.  Diminished but clear BS Heart: RRR.  No MRG Abdomen:  Soft, obese, no hernia.  Huge pannus associated with erythema and moisture in deep folds of groin.  Not tender, no mass.  No HSM but with her obesity would be easy to miss organomegaly.  Rectal: deep brown stool is soft, FOB negative. Large, cauliflower like hemorrhoids are not tender   Musc/Skeltl: bandage on the right knee Extremities:  Swelling in right ankle  Neurologic:  Appropriate, memory lapses but overall able to provide most history details.  Skin:  Rash in groin Tattoos:  none Nodes:  No cervical adenopathy   Psych:  Pleasant, relaxed.   Intake/Output from previous day: 01/25 0701 - 01/26 0700 In: 240 [P.O.:240] Out: 397 [Urine:395; Stool:2] Intake/Output this shift: Total I/O In: 700  [P.O.:700] Out: 652 [Urine:650; Stool:2]  LAB RESULTS:  Recent Labs  09/30/13 0038 09/30/13 1030 10/01/13 0454  WBC 6.0 4.9 4.7  HGB 9.1* 8.6* 8.5*  HCT 29.2* 26.5* 26.0*  PLT 239 224 192   BMET Lab Results  Component Value Date   NA 139 10/01/2013   NA 137 09/30/2013   NA 138 09/26/2013   K 3.4* 10/01/2013   K 3.7 09/30/2013   K 4.8 09/26/2013   CL 96 10/01/2013   CL 96 09/30/2013   CL 97 09/26/2013   CO2 33* 10/01/2013   CO2 32 09/30/2013   CO2 31 09/26/2013   GLUCOSE 121* 10/01/2013   GLUCOSE 143* 09/30/2013   GLUCOSE 104* 09/26/2013   BUN 27* 10/01/2013   BUN 25* 09/30/2013   BUN 34* 09/26/2013   CREATININE 1.29* 10/01/2013   CREATININE 1.34* 09/30/2013   CREATININE 1.35* 09/30/2013   CALCIUM 7.7* 10/01/2013   CALCIUM 7.9* 09/30/2013   CALCIUM 7.9* 09/26/2013   LFT No results found for this basename: PROT, ALBUMIN, AST, ALT, ALKPHOS, BILITOT, BILIDIR, IBILI,  in the last 72 hours PT/INR Lab Results  Component Value Date   INR 1.02 01/22/2013   INR 1.1 08/07/2008   INR 1.7* 06/21/2008     RADIOLOGY STUDIES: Dg Chest 2 View (if Patient Has Fever And/or Copd) 09/30/2013   CLINICAL DATA:  Shortness of breath and weakness.  EXAM: CHEST  2 VIEW  COMPARISON:  Chest radiograph September 22, 2013.  FINDINGS: Cardiac silhouette remains enlarged, similar. Mediastinal silhouette is nonsuspicious. Similar central pulmonary vasculature congestion interstitial prominence. Suspected  small pleural effusion. Strandy densities in the lung bases. No pneumothorax.  Right PICC distal tip projects in proximal superior vena cava. Large body habitus. Surgical clips project in the left breast. Multiple EKG lines overlie the patient and may obscure subtle underlying pathology. Osseous structures are unchanged.  IMPRESSION: Stable cardiomegaly and interstitial prominence suggesting pulmonary edema with suspected trace pleural effusion.  No apparent change in position of right PICC.   Electronically Signed   By:  Elon Alas   On: 09/30/2013 00:27   Ct Angio Chest Pe W/cm &/or Wo Cm 09/30/2013   CLINICAL DATA:  Recent right knee surgery, shortness of breath and tachycardia.  EXAM: CT ANGIOGRAPHY CHEST WITH CONTRAST  TECHNIQUE: Multidetector CT imaging of the chest was performed using the standard protocol during bolus administration of intravenous contrast. Multiplanar CT image reconstructions including MIPs were obtained to evaluate the vascular anatomy.  CONTRAST:  153mL OMNIPAQUE IOHEXOL 350 MG/ML SOLN  COMPARISON:  Chest radiograph September 30, 2013 and CT of the chest August 07, 2008  FINDINGS: Suboptimal bolus timing, no pulmonary arterial filling defects to at least the segmental branches, limited assessment of the subsegmental pulmonary arteries. Main pulmonary artery is not enlarged.  Heart size is mildly enlarged. Mild mitral annular calcifications. Pericardium is unremarkable. Thoracic esophagus is normal in course and caliber with mild calcific atherosclerosis. 13 mm pretracheal lymph node was 13 mm. Subcentimeter aortopulmonary window lymph nodes are unchanged. No lymphadenopathy by CT size criteria.  Heterogeneous lung attenuation, with mild tree-in-bud appearance of the right upper lobe, to a lesser extent right middle lobe and right lower lobe as well as the lingula. Patchy ground-glass opacities. Small right pleural effusion with atelectasis bilaterally. Tracheobronchial tree is patent and midline, no pneumothorax.  The included view of the abdomen is nonsuspicious. Status post suspected right thyroidectomy, incompletely characterized. Right PICC in place, distal tip in mid superior vena cava. Osseous structures are nonsuspicious.  Review of the MIP images confirms the above findings.  IMPRESSION: Suboptimal bolus timing without pulmonary embolism to at least the segmental branches.  Tree-in-bud infiltrates with patchy ground-glass opacities and air trapping with small right pleural effusion,  constellation of the findings are concerning for bronchiolitis/ early pneumonia.  Stable mediastinal lymphadenopathy.   Electronically Signed   By: Elon Alas   On: 09/30/2013 03:32   US Transvaginal Non-ob US Pelvis Complete 10/01/2013   CLINICAL DATA:  Vaginal bleeding  EXAM: TRANSABDOMINAL AND TRANSVAGINAL ULTRASOUND OF PELVIS  TECHNIQUE: Both transabdominal and transvaginal ultrasound examinations of the pelvis were performed. Transabdominal technique was performed for global imaging of the pelvis including uterus, ovaries, adnexal regions, and pelvic cul-de-sac. It was necessary to proceed with endovaginal exam following the transabdominal exam to visualize the endometrium.  COMPARISON:  None  FINDINGS: Uterus  Measurements: 11.3 x 5.2 x 7.0 cm. There is partially calcified fibroid within the uterine fundus measuring 5.5 by 4.6 x 6.3 cm. Hypoechoic endometrial lesion is identified measuring 1.8 x 1.5 x 3.1 cm. No significant blood flow identified within this structure.  Endometrium  Thickness: 31.6 mm.  No focal abnormality visualized.  Right ovary  Measurements: 6.4 x 5.8 x 6.3 cm. There is a cyst within the right ovary measuring 5 x 4.9 x 5.4 cm. This appears to have a mural nodule which measures approximately 2.2 cm.  Left ovary  Not visualized.  Other findings  Multiple nabothian cysts noted.  IMPRESSION: 1. The endometrium is thickened and there is a suspicious hypoechoic nodule present.  In the setting of post-menopausal bleeding, endometrial sampling is indicated to exclude carcinoma. If results are benign, sonohysterogram should be considered for focal lesion work-up prior to hysteroscopy. (Ref: Radiological Reasoning: Algorithmic Workup of Abnormal Vaginal Bleeding with Endovaginal Sonography and Sonohysterography. AJR 2008; [id number]) 2. Complicated cyst is identified within the left ovary containing what appears to be a mural nodule. Further assessment with pelvic MRI or surgical evaluation  is advised.   Electronically Signed   By: Kerby Moors M.D.   On: 10/01/2013 14:57    ENDOSCOPIC STUDIES: none  IMPRESSION:   *  Chronic normocytic anemia.  Has yet to require transfusion  *  Vaginal and possibly rectal bleeding.  In setting of post op DVT prophylaxis Lovenox.  Stool described as tarry over recent days.  FOB negative by my exam today. Lovenox discontinued today.   *  Dyspnea, early PNA by ct scan.  On Cefempime, Zyvox.   *  MRSA septic right knee replacement.  S/p hardware removal and abx spacer placement on 09/21/13.  On Daptomycin.   *  IDDM    *  Renal insufficiency.  Recent vancomycin induced ARF earlier this month.     PLAN:     *  See Dr. Henrene Pastor as noted below   Azucena Freed  10/01/2013, 3:18 PM Pager: [phone]  GI ATTENDING  History, laboratories, x-rays reviewed. Patient personally seen and examined. Agree with H&P as outlined above. IMPRESSION  1. Chronic anemia 2. Vaginal bleeding 3. Abnormal transvaginal ultrasound worrisome for endometrial carcinoma 4. No evidence for GI bleeding. Stool Hemoccult negative 5. Multiple medical problems, acutely ill, debilitated RECOMMENDATIONS 1. Empiric PPI 2. Transfuse as clinically indicated 3. Trachea medical problems 4. Gynecology evaluation when fit to undergo such. 5. No indication for further GI evaluation  Will sign off. Discussed with patient. Thank you  Docia Chuck. Geri Seminole., M.D. [facility] Division of Gastroenterology

## 2013-10-01 NOTE — Progress Notes (Signed)
Patient alert and oriented x4, husband at bedside.  Phone and call bell within reach.  MD in room this AM, made aware of patient's continued vaginal bleeding.  Transvaginal US ordered.  Patient denies any pain or discomfort with this bleeding.  Will continue to monitor.

## 2013-10-01 NOTE — Progress Notes (Signed)
Patient had originally asked for something to help her sleep but patient had an anxiety attack. Heart rate and blood pressure increased, patient was able to state she can't breath and respirations were 24-28. Encouraged patient to do persed lip breathing. Notified the on-call hospitalist Dr. Hilbert Bible. Orders given for Ativan, and Xopenex breathing treatment and both were administered as ordered. Once patient began to calm down, patient requested something for pain. Vicodin given per PRN order. After admiinistering medications, patient's heart rated and blood pressure begin to decrease. Will conitnue to monitor to end of shift.

## 2013-10-01 NOTE — Progress Notes (Signed)
I spoke with gynecologist on call for consult. They said that they didn't do endometrial biopsies in hospital but pt will need to have it done as soon as she is discharged.  They made her appointment to be seen in 2 weeks.  She said to stop the blood thinners and order pelvic ultrasound.  She said that pt likely could have a malignancy and therefore wouldn't benefit from giving her hormones at this time.  She said that that because the patient was hemodynamically stable and not severely hemorrhaging out from vagina that it was ok for her to be seen outpatient.    Murvin Natal, MD

## 2013-10-01 NOTE — Care Management Note (Signed)
    Page 1 of 1   10/01/2013     3:40:50 PM   CARE MANAGEMENT NOTE 10/01/2013  Patient:  Amy Burns, Amy Burns   Account Number:  [id number]  Date Initiated:  10/01/2013  Documentation initiated by:  [facility]  Subjective/Objective Assessment:   74 y.o. female with history of diabetes mellitus type 2, hypothyroidism, hypertension was recent admitted for septic arthritis and hand resection of the right knee joint with placement admitted with CAP//from [facility]     Action/Plan:   IV abx//Return to [facility]   Anticipated DC Date:  10/04/2013   Anticipated DC Plan:  [place]  In-house referral  Clinical Social Worker      DC Planning Services  CM consult      Choice offered to / List presented to:             Status of service:   Medicare Important Message given?   (If response is "NO", the following Medicare IM given date fields will be blank) Date Medicare IM given:   Date Additional Medicare IM given:    Discharge Disposition:    Per UR Regulation:    If discussed at Long Length of Stay Meetings, dates discussed:    Comments:  10/01/13 [place], RN, BSN, [employer] [phone] Spoke to pt and husband at bedside regarding discharge planning.  Pt is resident of [facility] and plans on returning there for rehabilitation of her knee.  Kendell Bane, LCSW notified and will follow up.

## 2013-10-01 NOTE — Progress Notes (Signed)
PHYSICIAN PROGRESS NOTE  Amy Burns ZOX:[id number] DOB: 24-Nov-1940 DOA: 09/29/2013  10/01/2013   PCP: Eulas Post, MD  Assessment/Plan: 1. Shortness of breath most likely secondary developing pneumonia -Appreciate ID consult. Treating HCAP.   2. History of CHF - Continuing Lasix. See #1. Recent 2-D echo showed normal EF. Closely follow intake output and metabolic panel. 3. Recently admitted for septic arthritis of the right knee - cultures grew MRSA. On daptomycin. See #1. Patient was having vancomycin renal toxicity. 4. Recent acute renal failure secondary to vancomycin - creatinine is improving. Closely monitor intake output. 5. Chronic anemia - follow CBC. With black stools seen, will ask for GI eval.  Pt never had a colonoscopy.  6. Profuse vaginal bleeding - for last 2 weeks, will ask for gynecology consult. Holding lovenox for now.  7. Hypertension - continue home medications. 8. Hypokalemia - replace orally, follow, check Magnesium 9. Hypothyroidism - continue Synthroid. 10. Diabetes mellitus type 2 - on sliding-scale coverage presently. Patient's metformin was discontinued due to the renal failure. Closely follow CBGs.  HPI/Subjective: Pt reports vaginal bleeding.  Also nurse reporting black tarry stools.  No CP.    Objective: Filed Vitals:   10/01/13 0429  BP: 155/58  Pulse: 91  Temp: 98.2 F (36.8 C)  Resp: 20    Intake/Output Summary (Last 24 hours) at 10/01/13 0920 Last data filed at 10/01/13 0500  Gross per 24 hour  Intake    240 ml  Output    397 ml  Net   -157 ml   Filed Weights   09/30/13 0709 09/30/13 0847 10/01/13 0429  Weight: 300 lb (136.079 kg) 285 lb 0.9 oz (129.3 kg) 282 lb 10.1 oz (128.2 kg)    Exam:  General: Well-developed and nourished.  Eyes: Anicteric no pallor.  ENT: MMM Neck: soft, supple, no thyromegaly  Cardiovascular: S1-S2 heard.  Respiratory: BBS, no wheezes heard.   Abdomen: Soft nontender bowel sounds present.  GU:  (RN present) profuse vaginal bleeding seen Skin: No rash. Right knee has dressing.  Musculoskeletal: Right knee has dressing.  Psychiatric: Appears normal.  Neurologic: Alert awake oriented to time place and person. Moves all extremities.  Data Reviewed: Basic Metabolic Panel:  Recent Labs Lab 09/25/13 0430 09/26/13 0308 09/30/13 0038 09/30/13 1030 10/01/13 0454  NA 139 138 137  --  139  K 5.1 4.8 3.7  --  3.4*  CL 99 97 96  --  96  CO2 30 31 32  --  33*  GLUCOSE 111* 104* 143*  --  121*  BUN 38* 34* 25*  --  27*  CREATININE 1.70* 1.59* 1.35* 1.34* 1.29*  CALCIUM 7.9* 7.9* 7.9*  --  7.7*  PHOS 4.3 3.9  --   --   --    Liver Function Tests:  Recent Labs Lab 09/25/13 0430 09/26/13 0308  ALBUMIN 1.6* 1.6*   No results found for this basename: LIPASE, AMYLASE,  in the last 168 hours No results found for this basename: AMMONIA,  in the last 168 hours CBC:  Recent Labs Lab 09/25/13 0430 09/30/13 0038 09/30/13 1030 10/01/13 0454  WBC 5.8 6.0 4.9 4.7  NEUTROABS  --   --  3.4  --   HGB 8.8* 9.1* 8.6* 8.5*  HCT 27.1* 29.2* 26.5* 26.0*  MCV 92.5 94.8 94.0 94.9  PLT 257 239 224 192   Cardiac Enzymes: No results found for this basename: CKTOTAL, CKMB, CKMBINDEX, TROPONINI,  in the last 168 hours BNP (  last 3 results)  Recent Labs  09/20/13 0550 09/30/13 0038 09/30/13 1030  PROBNP 1310.0* 2013.0* 2097.0*   CBG:  Recent Labs Lab 09/30/13 0854 09/30/13 1116 09/30/13 1604 09/30/13 2114 10/01/13 0610  GLUCAP 118* 104* 111* 112* 112*    Recent Results (from the past 240 hour(s))  URINE CULTURE     Status: None   Collection Time    09/22/13  3:53 PM      Result Value Range Status   Specimen Description URINE, CLEAN CATCH   Final   Special Requests NONE   Final   Culture  Setup Time     Final   Value: 09/22/2013 22:44     Performed at [place]     Final   Value: >=100,000 COLONIES/ML     Performed at [facility]    Culture     Final   Value: YEAST     Performed at [facility]   Report Status 09/23/2013 FINAL   Final  MRSA PCR SCREENING     Status: None   Collection Time    09/30/13  9:13 AM      Result Value Range Status   MRSA by PCR NEGATIVE  NEGATIVE Final   Comment:            The GeneXpert MRSA Assay (FDA     approved for NASAL specimens     only), is one component of a     comprehensive MRSA colonization     surveillance program. It is not     intended to diagnose MRSA     infection nor to guide or     monitor treatment for     MRSA infections.     Studies: Dg Chest 2 View (if Patient Has Fever And/or Copd)  09/30/2013   CLINICAL DATA:  Shortness of breath and weakness.  EXAM: CHEST  2 VIEW  COMPARISON:  Chest radiograph September 22, 2013.  FINDINGS: Cardiac silhouette remains enlarged, similar. Mediastinal silhouette is nonsuspicious. Similar central pulmonary vasculature congestion interstitial prominence. Suspected small pleural effusion. Strandy densities in the lung bases. No pneumothorax.  Right PICC distal tip projects in proximal superior vena cava. Large body habitus. Surgical clips project in the left breast. Multiple EKG lines overlie the patient and may obscure subtle underlying pathology. Osseous structures are unchanged.  IMPRESSION: Stable cardiomegaly and interstitial prominence suggesting pulmonary edema with suspected trace pleural effusion.  No apparent change in position of right PICC.   Electronically Signed   By: Elon Alas   On: 09/30/2013 00:27   Ct Angio Chest Pe W/cm &/or Wo Cm  09/30/2013   CLINICAL DATA:  Recent right knee surgery, shortness of breath and tachycardia.  EXAM: CT ANGIOGRAPHY CHEST WITH CONTRAST  TECHNIQUE: Multidetector CT imaging of the chest was performed using the standard protocol during bolus administration of intravenous contrast. Multiplanar CT image reconstructions including MIPs were obtained to evaluate the vascular anatomy.   CONTRAST:  164mL OMNIPAQUE IOHEXOL 350 MG/ML SOLN  COMPARISON:  Chest radiograph September 30, 2013 and CT of the chest August 07, 2008  FINDINGS: Suboptimal bolus timing, no pulmonary arterial filling defects to at least the segmental branches, limited assessment of the subsegmental pulmonary arteries. Main pulmonary artery is not enlarged.  Heart size is mildly enlarged. Mild mitral annular calcifications. Pericardium is unremarkable. Thoracic esophagus is normal in course and caliber with mild calcific atherosclerosis. 13 mm pretracheal lymph node was 13  mm. Subcentimeter aortopulmonary window lymph nodes are unchanged. No lymphadenopathy by CT size criteria.  Heterogeneous lung attenuation, with mild tree-in-bud appearance of the right upper lobe, to a lesser extent right middle lobe and right lower lobe as well as the lingula. Patchy ground-glass opacities. Small right pleural effusion with atelectasis bilaterally. Tracheobronchial tree is patent and midline, no pneumothorax.  The included view of the abdomen is nonsuspicious. Status post suspected right thyroidectomy, incompletely characterized. Right PICC in place, distal tip in mid superior vena cava. Osseous structures are nonsuspicious.  Review of the MIP images confirms the above findings.  IMPRESSION: Suboptimal bolus timing without pulmonary embolism to at least the segmental branches.  Tree-in-bud infiltrates with patchy ground-glass opacities and air trapping with small right pleural effusion, constellation of the findings are concerning for bronchiolitis/ early pneumonia.  Stable mediastinal lymphadenopathy.   Electronically Signed   By: Elon Alas   On: 09/30/2013 03:32   Scheduled Meds: . aspirin EC  81 mg Oral Daily  . ceFEPime (MAXIPIME) IV  2 g Intravenous Q12H  . furosemide  40 mg Oral BID  . insulin aspart  0-9 Units Subcutaneous TID WC  . levothyroxine  125 mcg Oral QAC breakfast  . linezolid  600 mg Oral BID  . potassium  chloride  40 mEq Oral BID  . sodium chloride  3 mL Intravenous Q12H  . sodium chloride  3 mL Intravenous Q12H   Continuous Infusions:   Principal Problem:   HCAP (healthcare-associated pneumonia) Active Problems:   HYPOTHYROIDISM   AODM   HYPERTENSION   OSTEOARTHRITIS   Morbid obesity with BMI of 50.0-59.9, adult   Infection of prosthetic right knee joint   Diastolic CHF, acute on chronic   CHF (congestive heart failure)   Anemia  Anarosa Kubisiak [facility]  [facility] Pager [phone]. If 7PM-7AM, please contact night-coverage at [email or web address], password [facility] 10/01/2013, 9:20 AM  LOS: 2 days

## 2013-10-02 DIAGNOSIS — E119 Type 2 diabetes mellitus without complications: Secondary | ICD-10-CM

## 2013-10-02 DIAGNOSIS — T8450XA Infection and inflammatory reaction due to unspecified internal joint prosthesis, initial encounter: Secondary | ICD-10-CM

## 2013-10-02 DIAGNOSIS — Z96659 Presence of unspecified artificial knee joint: Secondary | ICD-10-CM

## 2013-10-02 DIAGNOSIS — A4902 Methicillin resistant Staphylococcus aureus infection, unspecified site: Secondary | ICD-10-CM

## 2013-10-02 LAB — COMPREHENSIVE METABOLIC PANEL
ALT: 11 U/L (ref 0–35)
AST: 21 U/L (ref 0–37)
Albumin: 1.7 g/dL — ABNORMAL LOW (ref 3.5–5.2)
Alkaline Phosphatase: 113 U/L (ref 39–117)
BILIRUBIN TOTAL: 0.3 mg/dL (ref 0.3–1.2)
BUN: 28 mg/dL — ABNORMAL HIGH (ref 6–23)
CALCIUM: 7.7 mg/dL — AB (ref 8.4–10.5)
CHLORIDE: 98 meq/L (ref 96–112)
CO2: 32 mEq/L (ref 19–32)
CREATININE: 1.35 mg/dL — AB (ref 0.50–1.10)
GFR calc non Af Amer: 38 mL/min — ABNORMAL LOW (ref >90)
GFR, EST AFRICAN AMERICAN: 44 mL/min — AB (ref >90)
GLUCOSE: 127 mg/dL — AB (ref 70–99)
Potassium: 3.6 mEq/L — ABNORMAL LOW (ref 3.7–5.3)
Sodium: 141 mEq/L (ref 137–147)
Total Protein: 6.8 g/dL (ref 6.0–8.3)

## 2013-10-02 LAB — CBC
HCT: 25.6 % — ABNORMAL LOW (ref 36.0–46.0)
HEMOGLOBIN: 8.2 g/dL — AB (ref 12.0–15.0)
MCH: 30.4 pg (ref 26.0–34.0)
MCHC: 32 g/dL (ref 30.0–36.0)
MCV: 94.8 fL (ref 78.0–100.0)
PLATELETS: 176 10*3/uL (ref 150–400)
RBC: 2.7 MIL/uL — AB (ref 3.87–5.11)
RDW: 15.8 % — ABNORMAL HIGH (ref 11.5–15.5)
WBC: 5.4 10*3/uL (ref 4.0–10.5)

## 2013-10-02 LAB — PROTIME-INR
INR: 1.13 (ref 0.00–1.49)
Prothrombin Time: 14.3 seconds (ref 11.6–15.2)

## 2013-10-02 LAB — GLUCOSE, CAPILLARY
GLUCOSE-CAPILLARY: 110 mg/dL — AB (ref 70–99)
GLUCOSE-CAPILLARY: 161 mg/dL — AB (ref 70–99)
Glucose-Capillary: 110 mg/dL — ABNORMAL HIGH (ref 70–99)
Glucose-Capillary: 117 mg/dL — ABNORMAL HIGH (ref 70–99)

## 2013-10-02 LAB — MAGNESIUM: Magnesium: 1.6 mg/dL (ref 1.5–2.5)

## 2013-10-02 LAB — PROCALCITONIN: Procalcitonin: 0.28 ng/mL

## 2013-10-02 MED ORDER — ZOLPIDEM TARTRATE 5 MG PO TABS
5.0000 mg | ORAL_TABLET | Freq: Once | ORAL | Status: AC
  Administered 2013-10-02: 5 mg via ORAL
  Filled 2013-10-02: qty 1

## 2013-10-02 MED ORDER — AMLODIPINE BESYLATE 5 MG PO TABS
5.0000 mg | ORAL_TABLET | Freq: Every day | ORAL | Status: DC
  Administered 2013-10-02 – 2013-10-03 (×2): 5 mg via ORAL
  Filled 2013-10-02 (×2): qty 1

## 2013-10-02 MED ORDER — ATENOLOL 25 MG PO TABS
25.0000 mg | ORAL_TABLET | Freq: Every day | ORAL | Status: DC
  Administered 2013-10-02 – 2013-10-03 (×2): 25 mg via ORAL
  Filled 2013-10-02 (×2): qty 1

## 2013-10-02 NOTE — Progress Notes (Signed)
Pt a/o, c/o pain PRN Vicodin given as ordered, pt still having hematuria, no c/o dizziness, vss, pt stable

## 2013-10-02 NOTE — Evaluation (Addendum)
Physical Therapy Evaluation Patient Details Name: Amy Burns MRN: [id number] DOB: [birth year] Today's Date: 10/02/2013 Time: 4332-9518 PT Time Calculation (min): 19 min  PT Assessment / Plan / Recommendation History of Present Illness  73 y.o. female admitted with PNA.  From NH where she was doing rehab after right knee infection secondary to infected TKA.  S/p resection and antibiotic spacer.   Clinical Impression  Pt admitted with above. Pt currently with functional limitations due to the deficits listed below (see PT Problem List). Needs to return to NH for continued therapy once medically stable.  Pt will benefit from skilled PT to increase their independence and safety with mobility to allow discharge to the venue listed below.    PT Assessment  Patient needs continued PT services    Follow Up Recommendations  SNF;Supervision/Assistance - 24 hour         Barriers to Discharge Decreased caregiver support;Inaccessible home environment      Equipment Recommendations  None recommended by PT         Frequency Min 3X/week    Precautions / Restrictions Precautions Precautions: Fall Precaution Comments: No ROM R knee until clarified by Dr. Alvan Dame. Required Braces or Orthoses: Knee Immobilizer - Right Knee Immobilizer - Right: On when out of bed or walking Restrictions Weight Bearing Restrictions: Yes RLE Weight Bearing: Partial weight bearing RLE Partial Weight Bearing Percentage or Pounds: 25   Pertinent Vitals/Pain VSS, some  Pain with movement      Mobility  Bed Mobility Overal bed mobility: Needs Assistance;+2 for physical assistance Bed Mobility: Rolling Rolling: Min assist General bed mobility comments: assist with and support R leg , extra time.  Assisted pt on bedpan per pt request.  Could not get pt up due to knee immobilizer left at facility.  Husband to go get immobilizer so pt can do OOB activites tommorrow.  Bed level eval only.      Exercises General  Exercises - Lower Extremity Ankle Circles/Pumps: AROM;Both;5 reps;Supine Heel Slides: Left;10 reps;Supine Hip ABduction/ADduction: AROM;Left;10 reps;Supine   PT Diagnosis: Difficulty walking;Acute pain  PT Problem List: Decreased strength;Decreased activity tolerance;Pain;Decreased mobility;Obesity PT Treatment Interventions: DME instruction;Gait training;Functional mobility training;Therapeutic exercise;Therapeutic activities;Patient/family education     PT Goals(Current goals can be found in the care plan section) Acute Rehab PT Goals Patient Stated Goal: to go home PT Goal Formulation: With patient/family Time For Goal Achievement: 10/09/13 Potential to Achieve Goals: Good  Visit Information  Last PT Received On: 10/02/13 Assistance Needed: +2 History of Present Illness: 73 y.o. female admitted with PNA.  From NH where she was doing rehab after right knee infection secondary to infected TKA.  S/p resection and antibiotic spacer.        Prior [place] expects to be discharged to:: Skilled nursing facility Living Arrangements: Spouse/significant other Available Help at Discharge: Family;Available PRN/intermittently Type of Home: House Home Access: Stairs to enter [employer] of Steps: 1 then 1 Entrance Stairs-Rails: None Home Layout: One level Home Equipment: Walker - 2 wheels;Cane - single point;Bedside commode Additional Comments: Pt was at [facility] for three days. Prior Function Level of Independence: Needs assistance Gait / Transfers Assistance Needed: Needed assist with bed to chair with RW with min to mod assist. ADL's / Homemaking Assistance Needed: total assist Communication Communication: No difficulties Dominant Hand: Right    Cognition  Cognition Arousal/Alertness: Awake/alert Behavior During Therapy: WFL for tasks assessed/performed Overall Cognitive Status: Within Functional Limits for tasks assessed  Extremity/Trunk Assessment Upper Extremity Assessment Upper Extremity Assessment: Defer to OT evaluation Lower Extremity Assessment Lower Extremity Assessment: RLE deficits/detail RLE Deficits / Details: requires assist to raise leg. RLE: Unable to fully assess due to pain;Unable to fully assess due to immobilization Cervical / Trunk Assessment Cervical / Trunk Assessment: Normal   Balance    End of Session PT - End of Session Equipment Utilized During Treatment: Gait belt Activity Tolerance: Patient limited by pain Patient left: in bed;with call bell/phone within reach;with family/visitor present Nurse Communication: Mobility status;Need for lift equipment       INGOLD,Sherlock Nancarrow 10/02/2013, 11:03 AM [facility] [phone] (pager)

## 2013-10-02 NOTE — Progress Notes (Signed)
[facility] for Infectious Disease    Date of Admission:  09/29/2013   Total days of antibiotics 20        Day 3 cefepime        Day 3 linezolid           ID: Amy Burns is a 73 y.o. female with MRSA right knee prosthetic joint infection now with presumed hcap Principal Problem:   HCAP (healthcare-associated pneumonia) Active Problems:   HYPOTHYROIDISM   AODM   HYPERTENSION   OSTEOARTHRITIS   Morbid obesity with BMI of 50.0-59.9, adult   Infection of prosthetic right knee joint   Diastolic CHF, acute on chronic   CHF (congestive heart failure)   Anemia   Postmenopausal vaginal bleeding    Subjective: Afebrile. Improved cough but reports having episodic shortness of breath when she is overheated, thinks it is related to anxiety  Medications:  . amLODipine  5 mg Oral Daily  . aspirin EC  81 mg Oral Daily  . atenolol  25 mg Oral Daily  . ceFEPime (MAXIPIME) IV  2 g Intravenous Q12H  . feeding supplement (ENSURE)  1 Container Oral Q24H  . furosemide  40 mg Oral BID  . insulin aspart  0-9 Units Subcutaneous TID WC  . levothyroxine  125 mcg Oral QAC breakfast  . linezolid  600 mg Oral BID  . multivitamin with minerals  1 tablet Oral Daily  . pantoprazole  40 mg Oral Q0600  . potassium chloride  40 mEq Oral BID  . sodium chloride  3 mL Intravenous Q12H    Objective: Vital signs in last 24 hours: Temp:  [97.7 F (36.5 C)-98.8 F (37.1 C)] 97.7 F (36.5 C) (01/27 1345) Pulse Rate:  [80-117] 80 (01/27 1345) Resp:  [18-20] 18 (01/27 1345) BP: (143-189)/(51-92) 143/51 mmHg (01/27 1345) SpO2:  [90 %-100 %] 100 % (01/27 1345) Weight:  [291 lb 0.1 oz (132 kg)] 291 lb 0.1 oz (132 kg) (01/27 3825) General: a xo by 3, in nad,obese female Lungs: Scattered wheezing  Cor: Regular S1 and S2 no murmurs  Abdomen: Obese, soft and nontender  Ext: Right knee incision with staples in is clean and dry with no evidence of active infection Skin: Right arm PICC appears normal    Psych= appears anxious   Lab Results  Recent Labs  10/01/13 0454 10/02/13 0450  WBC 4.7 5.4  HGB 8.5* 8.2*  HCT 26.0* 25.6*  NA 139 141  K 3.4* 3.6*  CL 96 98  CO2 33* 32  BUN 27* 28*  CREATININE 1.29* 1.35*   Liver Panel  Recent Labs  10/02/13 0450  PROT 6.8  ALBUMIN 1.7*  AST 21  ALT 11  ALKPHOS 113  BILITOT 0.3    Studies/Results: US Transvaginal Non-ob  10/01/2013   CLINICAL DATA:  Vaginal bleeding  EXAM: TRANSABDOMINAL AND TRANSVAGINAL ULTRASOUND OF PELVIS  TECHNIQUE: Both transabdominal and transvaginal ultrasound examinations of the pelvis were performed. Transabdominal technique was performed for global imaging of the pelvis including uterus, ovaries, adnexal regions, and pelvic cul-de-sac. It was necessary to proceed with endovaginal exam following the transabdominal exam to visualize the endometrium.  COMPARISON:  None  FINDINGS: Uterus  Measurements: 11.3 x 5.2 x 7.0 cm. There is partially calcified fibroid within the uterine fundus measuring 5.5 by 4.6 x 6.3 cm. Hypoechoic endometrial lesion is identified measuring 1.8 x 1.5 x 3.1 cm. No significant blood flow identified within this structure.  Endometrium  Thickness: 31.6  mm.  No focal abnormality visualized.  Right ovary  Measurements: 6.4 x 5.8 x 6.3 cm. There is a cyst within the right ovary measuring 5 x 4.9 x 5.4 cm. This appears to have a mural nodule which measures approximately 2.2 cm.  Left ovary  Not visualized.  Other findings  Multiple nabothian cysts noted.  IMPRESSION: 1. The endometrium is thickened and there is a suspicious hypoechoic nodule present. In the setting of post-menopausal bleeding, endometrial sampling is indicated to exclude carcinoma. If results are benign, sonohysterogram should be considered for focal lesion work-up prior to hysteroscopy. (Ref: Radiological Reasoning: Algorithmic Workup of Abnormal Vaginal Bleeding with Endovaginal Sonography and Sonohysterography. AJR 2008; 175:Z02-58)  2. Complicated cyst is identified within the left ovary containing what appears to be a mural nodule. Further assessment with pelvic MRI or surgical evaluation is advised.   Electronically Signed   By: Kerby Moors M.D.   On: 10/01/2013 14:57   US Pelvis Complete  10/01/2013   CLINICAL DATA:  Vaginal bleeding  EXAM: TRANSABDOMINAL AND TRANSVAGINAL ULTRASOUND OF PELVIS  TECHNIQUE: Both transabdominal and transvaginal ultrasound examinations of the pelvis were performed. Transabdominal technique was performed for global imaging of the pelvis including uterus, ovaries, adnexal regions, and pelvic cul-de-sac. It was necessary to proceed with endovaginal exam following the transabdominal exam to visualize the endometrium.  COMPARISON:  None  FINDINGS: Uterus  Measurements: 11.3 x 5.2 x 7.0 cm. There is partially calcified fibroid within the uterine fundus measuring 5.5 by 4.6 x 6.3 cm. Hypoechoic endometrial lesion is identified measuring 1.8 x 1.5 x 3.1 cm. No significant blood flow identified within this structure.  Endometrium  Thickness: 31.6 mm.  No focal abnormality visualized.  Right ovary  Measurements: 6.4 x 5.8 x 6.3 cm. There is a cyst within the right ovary measuring 5 x 4.9 x 5.4 cm. This appears to have a mural nodule which measures approximately 2.2 cm.  Left ovary  Not visualized.  Other findings  Multiple nabothian cysts noted.  IMPRESSION: 1. The endometrium is thickened and there is a suspicious hypoechoic nodule present. In the setting of post-menopausal bleeding, endometrial sampling is indicated to exclude carcinoma. If results are benign, sonohysterogram should be considered for focal lesion work-up prior to hysteroscopy. (Ref: Radiological Reasoning: Algorithmic Workup of Abnormal Vaginal Bleeding with Endovaginal Sonography and Sonohysterography. AJR 2008; 527:P82-42) 2. Complicated cyst is identified within the left ovary containing what appears to be a mural nodule. Further assessment with  pelvic MRI or surgical evaluation is advised.   Electronically Signed   By: Kerby Moors M.D.   On: 10/01/2013 14:57     Assessment/Plan: mrsa prosthetic joint infection = at discharge resume back to daptomycin 500mg  daily until 11/02/12. She will need weekly ck, bmp until completion of course of therapy  hcap = finish 7day course of cefepime, currently on day 3. Will discontinue linezolid tomorrow/at discharge, concern for thrombocytopenia. Suspicion for mrsa pneumonia is low.  We will see her back in ID clinic in 2-4 wk  [place], [facility] for Infectious Diseases Cell: [phone] Pager: [phone]  10/02/2013, 4:22 PM

## 2013-10-02 NOTE — Progress Notes (Signed)
PHYSICIAN PROGRESS NOTE  Amy Burns [id number] DOB: 1941/02/25 DOA: 09/29/2013  10/02/2013   PCP: Eulas Post, MD  Assessment/Plan: 1. Shortness of breath most likely secondary HCAP - Appreciate ID consult. Treating with cefipime.  2. History of CHF - Continuing Lasix. See #1. Recent 2-D echo showed normal EF. Closely follow intake output and metabolic panel. 3. Recently admitted for septic arthritis of the right knee - cultures grew MRSA. On Zyvox per ID will need to treat thru 2/27 to complete course.  4. Recent acute renal failure secondary to vancomycin - creatinine is improving. Closely monitor intake output. 5. Chronic anemia - Hemoglobin stable. Stopped lovenox.  GI eval and rec pt continue protonix for now.  Will need to see GI outpatient after she has recovered for colonoscopy.  Pt never had a colonoscopy.  6. Vaginal bleeding - Holding lovenox for now.  Gyn consulted.  Vag US shows thickened endometrium.  Pt likely has malignancy. Gyn says that patient should have endometrial biopsy in the office in 2 weeks.  Pt has a [employer]ner, broadcasting/film[employer]video Dr. Derrill Memo who she wants to follow up with.  I stressed to her the importance of following up with this and the patient verbalized understanding.  7. Hypertension - not well controlled, will adjust some meds.  8. Hypokalemia - replace orally, follow, check Magnesium 9. Hypothyroidism - continue Synthroid. 10. Diabetes mellitus type 2 - on sliding-scale coverage presently. Patient's metformin was discontinued due to the renal failure. Closely follow CBGs.  Code Status: FULL Family Communication: spoke with husband at bedside  Disposition Plan: to SNF tomorrow   HPI/Subjective: Pt reports that she had SOB overnight but has resolved today and resting much better.    Objective: Filed Vitals:   10/02/13 0648  BP: 160/92  Pulse: 88  Temp: 98.3 F (36.8 C)  Resp: 20    Intake/Output Summary (Last 24 hours) at 10/02/13  1124 Last data filed at 10/02/13 1010  Gross per 24 hour  Intake    700 ml  Output    929 ml  Net   -229 ml   Filed Weights   09/30/13 0847 10/01/13 0429 10/02/13 0648  Weight: 285 lb 0.9 oz (129.3 kg) 282 lb 10.1 oz (128.2 kg) 291 lb 0.1 oz (132 kg)    Exam:  General: Well-developed and nourished.  Eyes: Anicteric no pallor.  ENT: MMM  Neck: soft, supple, no thyromegaly  Cardiovascular: S1-S2 heard.  Respiratory: BBS, fairly clear, no wheezes heard.  Abdomen: Soft nontender bowel sounds present.  Skin: No rash. Right knee has dressing.  Musculoskeletal: Right knee has dressing.  Psychiatric: Appears normal.  Neurologic: Alert awake oriented to time place and person. Moves all extremities.   Data Reviewed: Basic Metabolic Panel:  Recent Labs Lab 09/26/13 0308 09/30/13 0038 09/30/13 1030 10/01/13 0454 10/02/13 0450  NA 138 137  --  139 141  K 4.8 3.7  --  3.4* 3.6*  CL 97 96  --  96 98  CO2 31 32  --  33* 32  GLUCOSE 104* 143*  --  121* 127*  BUN 34* 25*  --  27* 28*  CREATININE 1.59* 1.35* 1.34* 1.29* 1.35*  CALCIUM 7.9* 7.9*  --  7.7* 7.7*  MG  --   --   --   --  1.6  PHOS 3.9  --   --   --   --    Liver Function Tests:  Recent Labs Lab 09/26/13 0308 10/02/13  0450  AST  --  21  ALT  --  11  ALKPHOS  --  113  BILITOT  --  0.3  PROT  --  6.8  ALBUMIN 1.6* 1.7*   No results found for this basename: LIPASE, AMYLASE,  in the last 168 hours No results found for this basename: AMMONIA,  in the last 168 hours CBC:  Recent Labs Lab 09/30/13 0038 09/30/13 1030 10-20-2013 0454 10/02/13 0450  WBC 6.0 4.9 4.7 5.4  NEUTROABS  --  3.4  --   --   HGB 9.1* 8.6* 8.5* 8.2*  HCT 29.2* 26.5* 26.0* 25.6*  MCV 94.8 94.0 94.9 94.8  PLT 239 224 192 176   Cardiac Enzymes: No results found for this basename: CKTOTAL, CKMB, CKMBINDEX, TROPONINI,  in the last 168 hours BNP (last 3 results)  Recent Labs  09/20/13 0550 09/30/13 0038 09/30/13 1030  PROBNP 1310.0*  2013.0* 2097.0*   CBG:  Recent Labs Lab 10-20-13 0610 2013-10-20 1223 2013/10/20 1625 20-Oct-2013 2112 10/02/13 0714  GLUCAP 112* 119* 146* 152* 110*    Recent Results (from the past 240 hour(s))  URINE CULTURE     Status: None   Collection Time    09/22/13  3:53 PM      Result Value Range Status   Specimen Description URINE, CLEAN CATCH   Final   Special Requests NONE   Final   Culture  Setup Time     Final   Value: 09/22/2013 22:44     Performed at [facility]     Final   Value: >=100,000 COLONIES/ML     Performed at [facility]   Culture     Final   Value: YEAST     Performed at [facility]   Report Status 09/23/2013 FINAL   Final  MRSA PCR SCREENING     Status: None   Collection Time    09/30/13  9:13 AM      Result Value Range Status   MRSA by PCR NEGATIVE  NEGATIVE Final   Comment:            The GeneXpert MRSA Assay (FDA     approved for NASAL specimens     only), is one component of a     comprehensive MRSA colonization     surveillance program. It is not     intended to diagnose MRSA     infection nor to guide or     monitor treatment for     MRSA infections.    Studies: US Transvaginal Non-ob  2013/10/20   CLINICAL DATA:  Vaginal bleeding  EXAM: TRANSABDOMINAL AND TRANSVAGINAL ULTRASOUND OF PELVIS  TECHNIQUE: Both transabdominal and transvaginal ultrasound examinations of the pelvis were performed. Transabdominal technique was performed for global imaging of the pelvis including uterus, ovaries, adnexal regions, and pelvic cul-de-sac. It was necessary to proceed with endovaginal exam following the transabdominal exam to visualize the endometrium.  COMPARISON:  None  FINDINGS: Uterus  Measurements: 11.3 x 5.2 x 7.0 cm. There is partially calcified fibroid within the uterine fundus measuring 5.5 by 4.6 x 6.3 cm. Hypoechoic endometrial lesion is identified measuring 1.8 x 1.5 x 3.1 cm. No significant blood flow identified  within this structure.  Endometrium  Thickness: 31.6 mm.  No focal abnormality visualized.  Right ovary  Measurements: 6.4 x 5.8 x 6.3 cm. There is a cyst within the right ovary measuring 5 x 4.9 x 5.4 cm.  This appears to have a mural nodule which measures approximately 2.2 cm.  Left ovary  Not visualized.  Other findings  Multiple nabothian cysts noted.  IMPRESSION: 1. The endometrium is thickened and there is a suspicious hypoechoic nodule present. In the setting of post-menopausal bleeding, endometrial sampling is indicated to exclude carcinoma. If results are benign, sonohysterogram should be considered for focal lesion work-up prior to hysteroscopy. (Ref: Radiological Reasoning: Algorithmic Workup of Abnormal Vaginal Bleeding with Endovaginal Sonography and Sonohysterography. AJR 2008; 130:Q65-78) 2. Complicated cyst is identified within the left ovary containing what appears to be a mural nodule. Further assessment with pelvic MRI or surgical evaluation is advised.   Electronically Signed   By: Kerby Moors M.D.   On: 10/01/2013 14:57   US Pelvis Complete  10/01/2013   CLINICAL DATA:  Vaginal bleeding  EXAM: TRANSABDOMINAL AND TRANSVAGINAL ULTRASOUND OF PELVIS  TECHNIQUE: Both transabdominal and transvaginal ultrasound examinations of the pelvis were performed. Transabdominal technique was performed for global imaging of the pelvis including uterus, ovaries, adnexal regions, and pelvic cul-de-sac. It was necessary to proceed with endovaginal exam following the transabdominal exam to visualize the endometrium.  COMPARISON:  None  FINDINGS: Uterus  Measurements: 11.3 x 5.2 x 7.0 cm. There is partially calcified fibroid within the uterine fundus measuring 5.5 by 4.6 x 6.3 cm. Hypoechoic endometrial lesion is identified measuring 1.8 x 1.5 x 3.1 cm. No significant blood flow identified within this structure.  Endometrium  Thickness: 31.6 mm.  No focal abnormality visualized.  Right ovary  Measurements: 6.4 x  5.8 x 6.3 cm. There is a cyst within the right ovary measuring 5 x 4.9 x 5.4 cm. This appears to have a mural nodule which measures approximately 2.2 cm.  Left ovary  Not visualized.  Other findings  Multiple nabothian cysts noted.  IMPRESSION: 1. The endometrium is thickened and there is a suspicious hypoechoic nodule present. In the setting of post-menopausal bleeding, endometrial sampling is indicated to exclude carcinoma. If results are benign, sonohysterogram should be considered for focal lesion work-up prior to hysteroscopy. (Ref: Radiological Reasoning: Algorithmic Workup of Abnormal Vaginal Bleeding with Endovaginal Sonography and Sonohysterography. AJR 2008; 469:G29-52) 2. Complicated cyst is identified within the left ovary containing what appears to be a mural nodule. Further assessment with pelvic MRI or surgical evaluation is advised.   Electronically Signed   By: Kerby Moors M.D.   On: 10/01/2013 14:57    Scheduled Meds: . aspirin EC  81 mg Oral Daily  . ceFEPime (MAXIPIME) IV  2 g Intravenous Q12H  . feeding supplement (ENSURE)  1 Container Oral Q24H  . furosemide  40 mg Oral BID  . insulin aspart  0-9 Units Subcutaneous TID WC  . levothyroxine  125 mcg Oral QAC breakfast  . linezolid  600 mg Oral BID  . multivitamin with minerals  1 tablet Oral Daily  . pantoprazole  40 mg Oral Q0600  . potassium chloride  40 mEq Oral BID  . sodium chloride  3 mL Intravenous Q12H   Continuous Infusions:   Principal Problem:   HCAP (healthcare-associated pneumonia) Active Problems:   HYPOTHYROIDISM   AODM   HYPERTENSION   OSTEOARTHRITIS   Morbid obesity with BMI of 50.0-59.9, adult   Infection of prosthetic right knee joint   Diastolic CHF, acute on chronic   CHF (congestive heart failure)   Anemia   Postmenopausal vaginal bleeding  Amy Burns [facility]  [facility] Pager [phone]. If 7PM-7AM, please contact night-coverage at [email or web address],  password [facility] 10/02/2013, 11:24 AM   LOS: 3 days

## 2013-10-03 DIAGNOSIS — I5033 Acute on chronic diastolic (congestive) heart failure: Secondary | ICD-10-CM

## 2013-10-03 DIAGNOSIS — Z6841 Body Mass Index (BMI) 40.0 and over, adult: Secondary | ICD-10-CM

## 2013-10-03 DIAGNOSIS — E46 Unspecified protein-calorie malnutrition: Secondary | ICD-10-CM

## 2013-10-03 LAB — BASIC METABOLIC PANEL
BUN: 34 mg/dL — ABNORMAL HIGH (ref 6–23)
CO2: 29 meq/L (ref 19–32)
Calcium: 8 mg/dL — ABNORMAL LOW (ref 8.4–10.5)
Chloride: 100 mEq/L (ref 96–112)
Creatinine, Ser: 1.75 mg/dL — ABNORMAL HIGH (ref 0.50–1.10)
GFR calc Af Amer: 32 mL/min — ABNORMAL LOW (ref >90)
GFR, EST NON AFRICAN AMERICAN: 28 mL/min — AB (ref >90)
Glucose, Bld: 114 mg/dL — ABNORMAL HIGH (ref 70–99)
Potassium: 4.9 mEq/L (ref 3.7–5.3)
SODIUM: 140 meq/L (ref 137–147)

## 2013-10-03 LAB — CBC
HEMATOCRIT: 27.2 % — AB (ref 36.0–46.0)
HEMOGLOBIN: 8.4 g/dL — AB (ref 12.0–15.0)
MCH: 30 pg (ref 26.0–34.0)
MCHC: 30.9 g/dL (ref 30.0–36.0)
MCV: 97.1 fL (ref 78.0–100.0)
Platelets: 177 10*3/uL (ref 150–400)
RBC: 2.8 MIL/uL — ABNORMAL LOW (ref 3.87–5.11)
RDW: 15.9 % — ABNORMAL HIGH (ref 11.5–15.5)
WBC: 5.6 10*3/uL (ref 4.0–10.5)

## 2013-10-03 LAB — GLUCOSE, CAPILLARY
GLUCOSE-CAPILLARY: 112 mg/dL — AB (ref 70–99)
Glucose-Capillary: 116 mg/dL — ABNORMAL HIGH (ref 70–99)

## 2013-10-03 LAB — CK: Total CK: 45 U/L (ref 7–177)

## 2013-10-03 LAB — HEMOGLOBIN AND HEMATOCRIT, BLOOD
HEMATOCRIT: 27.2 % — AB (ref 36.0–46.0)
HEMOGLOBIN: 8.6 g/dL — AB (ref 12.0–15.0)

## 2013-10-03 MED ORDER — ALBUTEROL SULFATE HFA 108 (90 BASE) MCG/ACT IN AERS: 2.0000 | INHALATION_SPRAY | Freq: Four times a day (QID) | RESPIRATORY_TRACT | Status: AC | PRN

## 2013-10-03 MED ORDER — ADULT MULTIVITAMIN W/MINERALS CH: 1.0000 | ORAL_TABLET | Freq: Every day | ORAL | Status: AC

## 2013-10-03 MED ORDER — DEXTROSE 5 % IV SOLN: 2.0000 g | Freq: Two times a day (BID) | INTRAVENOUS | Status: DC

## 2013-10-03 MED ORDER — HEPARIN SOD (PORK) LOCK FLUSH 100 UNIT/ML IV SOLN
250.0000 [IU] | INTRAVENOUS | Status: AC | PRN
  Administered 2013-10-03 (×2)

## 2013-10-03 MED ORDER — POTASSIUM CHLORIDE CRYS ER 20 MEQ PO TBCR: 40.0000 meq | EXTENDED_RELEASE_TABLET | Freq: Every day | ORAL | Status: AC

## 2013-10-03 MED ORDER — AMLODIPINE BESYLATE 5 MG PO TABS: 5.0000 mg | ORAL_TABLET | Freq: Every day | ORAL | Status: AC

## 2013-10-03 MED ORDER — ATENOLOL 25 MG PO TABS: 25.0000 mg | ORAL_TABLET | Freq: Every day | ORAL | Status: AC

## 2013-10-03 MED ORDER — ACETAMINOPHEN 500 MG PO TABS: 1000.0000 mg | ORAL_TABLET | Freq: Four times a day (QID) | ORAL | Status: AC | PRN

## 2013-10-03 MED ORDER — PANTOPRAZOLE SODIUM 40 MG PO TBEC: 40.0000 mg | DELAYED_RELEASE_TABLET | Freq: Every day | ORAL | Status: AC

## 2013-10-03 MED ORDER — HYDROCODONE-ACETAMINOPHEN 5-325 MG PO TABS: 1.0000 | ORAL_TABLET | Freq: Four times a day (QID) | ORAL | Status: AC | PRN

## 2013-10-03 MED ORDER — GLUCERNA SHAKE PO LIQD: 237.0000 mL | Freq: Every day | ORAL | Status: AC | PRN

## 2013-10-03 MED ORDER — ENSURE PUDDING PO PUDG: 1.0000 | ORAL | Status: AC

## 2013-10-03 NOTE — Progress Notes (Signed)
Physical Therapy Treatment Patient Details Name: Amy Burns MRN: [id number] DOB: [birth year] Today's Date: 10/03/2013 Time: 0850-0920 PT Time Calculation (min): 30 min  PT Assessment / Plan / Recommendation  History of Present Illness husband brought knee immobilizer and RW from nursing home   PT Comments   Pt is able to pivot well to bedside commode toward left side and is improving in initial gait with weight bearing limitations.  Follow Up Recommendations  SNF;Supervision/Assistance - 24 hour     Does the patient have the potential to tolerate intense rehabilitation     Barriers to Discharge        Equipment Recommendations  None recommended by PT    Recommendations for Other Services    Frequency Min 3X/week   Progress towards PT Goals Progress towards PT goals: Progressing toward goals  Plan Current plan remains appropriate    Precautions / Restrictions Precautions Precautions: Fall Precaution Comments: No ROM R knee until clarified by Dr. Alvan Dame. Required Braces or Orthoses: Knee Immobilizer - Right Knee Immobilizer - Right: On when out of bed or walking Restrictions Weight Bearing Restrictions: Yes RLE Weight Bearing: Partial weight bearing RLE Partial Weight Bearing Percentage or Pounds: 25   Pertinent Vitals/Pain O2 maintained.  Pt dyspneic on exetiong    Mobility  Bed Mobility Overal bed mobility: Needs Assistance Bed Mobility: Supine to Sit Supine to sit: Min assist General bed mobility comments: assist to support right leg and move lower body to edge of bed. Pt pulled up on my arm to get to the edge of the bed Transfers Overall transfer level: Needs assistance Equipment used: Rolling walker (2 wheeled) Sit to Stand: Min assist Stand pivot transfers: Min assist General transfer comment: pt is able to pivot well toward left (nonoperated side) and is able to maintain 25% WB with KI Pt transferred to bedside commode for BM and then from there to  chair Ambulation/Gait Ambulation/Gait assistance: Min assist Ambulation Distance (Feet): 5 Feet Assistive device: Rolling walker (2 wheeled) Gait Pattern/deviations: Step-to pattern;Decreased step length - right;Decreased step length - left;Decreased weight shift to right Gait velocity: decreased  Gait velocity interpretation: <1.8 ft/sec, indicative of risk for recurrent falls General Gait Details: Pt limited by fatigue from arms heavily used to limite weight bearing on RLE    Exercises     PT Diagnosis:    PT Problem List:   PT Treatment Interventions:     PT Goals (current goals can now be found in the care plan section)    Visit Information  Last PT Received On: 10/03/13 Assistance Needed: +2 History of Present Illness: husband brought knee immobilizer and RW from nursing home    Subjective Data      Cognition  Cognition Arousal/Alertness: Awake/alert Behavior During Therapy: WFL for tasks assessed/performed Overall Cognitive Status: Within Functional Limits for tasks assessed    Balance     End of Session PT - End of Session Activity Tolerance: Patient limited by fatigue Patient left: in chair;with call bell/phone within reach;with family/visitor present Nurse Communication: Mobility status   GP     Norwood Levo 10/03/2013, 10:39 AM

## 2013-10-03 NOTE — Progress Notes (Signed)
Ok per MD for d/c today back to [facility] via EMS. CSW met with patient and her husband. They both agreed to return to facility.  Patient will require IV antbiotics via PICC line. Ok per [facility]- Admissions at [facility].  Nursing notified to call report. CSW signing off.  Lorie Phenix. [place], [place]

## 2013-10-03 NOTE — Progress Notes (Signed)
Pt being d/c to [facility], report called to facility, pt leaving via ambulance, pt stable

## 2013-10-03 NOTE — Discharge Summary (Signed)
Physician Discharge Summary  STEPHENIA VOGAN YBO:[id number] DOB: 1941-01-01 DOA: 09/29/2013  PCP: Eulas Post, MD  Admit date: 09/29/2013 Discharge date: 10/03/2013  Time spent: >30 minutes  Recommendations for Outpatient Follow-up:  -Follow a low sodium diet -Patient needs follow up with Gynecology for further evaluation and treatment of vaginal bleeding (see appoint section) -CBC in 5 days to follow Hgb trend -BMET and CK level weekly to follow electrolytes, renal function and muscle enzymes while receiving Daptomycin -Reassess BP and adjust medications as needed  Discharge Diagnoses:  Principal Problem:   HCAP (healthcare-associated pneumonia) Active Problems:   HYPOTHYROIDISM   AODM   HYPERTENSION   OSTEOARTHRITIS   Morbid obesity with BMI of 50.0-59.9, adult   Infection of prosthetic right knee joint   Diastolic CHF, acute on chronic   CHF (congestive heart failure)   Anemia   Postmenopausal vaginal bleeding   Discharge Condition: stable and improved. Will discharge to SNF with IV antibiotics and outpatient follow up with gynecology, orthopedic service and Infectious disease clinic  Diet recommendation: low sodium diet and low carb diet  Filed Weights   10/01/13 0429 10/02/13 0648 10/03/13 0548  Weight: 128.2 kg (282 lb 10.1 oz) 132 kg (291 lb 0.1 oz) 128.4 kg (283 lb 1.1 oz)    History of present illness:  73 y.o. female with history of diabetes mellitus type 2, hypothyroidism, hypertension was recent admitted for septic arthritis and hand resection of the right knee joint with placement of antibiotic spacer and presently is on daptomycin for MRSA septic arthritis(patient had renal toxicity to vancomycin) was brought from the rehabilitation after patient was complaining of shortness of breath. Patient states that over the last 2-3 days she has been having some shortness of breath which acutely worsened with some productive cough. Patient has subjective feeling of  fever chills. Denies any chest pain. In the ER CT angiogram of the chest done for PE was negative for PE but showed features concerning for developing pneumonia. Patient has been started antibiotics and will be admitted for further management. Patient denies any nausea vomiting abdominal pain or diarrhea.   Hospital Course:  1. Shortness of breath secondary HCAP - Appreciate ID consult. Will complete treatment with cefepime IV ( 4 more doses pending).  2. History of CHF - Continuing Lasix. See #1. Recent 2-D echo showed normal EF. Closely follow intake/output, low sodium diet and metabolic panel. 3. Recently admitted for septic arthritis of the right knee - cultures grew MRSA. On daptomycin per ID will need to treat thru 2/27 to complete course.  4. Recent acute renal failure secondary to vancomycin - creatinine is improving. Closely monitor intake output and weekly BMET. 5. Chronic anemia - Hemoglobin stable at discharge (8.6). GI evaluated patient and rec pt continue protonix for now. Will need to see GI outpatient after she has recovered from acute health issues, to have screening colonoscopy.   6. Vaginal bleeding - Gyn consulted. Vag US shows thickened endometrium. 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 Gyn says that patient should have endometrial biopsy in the office in 2 weeks. Pt has a Runner, broadcasting/film/video Dr. Derrill Memo who she wants to follow up with. I stressed to her the importance of following up with this and the patient verbalized understanding.  7. Hypertension - stable at discharge. Patient to follow low sodium diet and to take medications as prescribed. Further adjustments to be done as needed base on BP fluctuation  8. Hypokalemia - repleted at discharge. Will use maintenance potassium  along with diuretics. 9. Hypothyroidism - continue Synthroid. 10. Diabetes mellitus type 2 - on sliding-scale coverage presently. Patient's metformin was discontinued due to the renal function. Closely  follow CBGs and use SSI.   Procedures:  See below for x-ray reports  Consultations:  Orthopedic service  ID  GI  Discharge Exam: Filed Vitals:   10/03/13 0548  BP: 146/61  Pulse: 65  Temp: 98.4 F (36.9 C)  Resp: 22   General: Well-developed; no acute distress, afebrile  Eyes: Anicteric no pallor.  ENT: MMM  Neck: soft, supple, no thyromegaly; no JVD  Cardiovascular: S1-S2 heard.  Respiratory: BBS, fairly clear, no wheezes or crackles heard.  Abdomen: Soft nontender bowel sounds present.  Musculoskeletal: Right knee has dressing and immobilizer.  Neurologic: Alert, awake and oriented to time, place and person. Moves all extremities.  Discharge Instructions  Discharge Orders   Future Appointments Provider Department Dept Phone   10/11/2013 10:15 AM Thayer Headings, MD [facility] [phone]   10/29/2013 1:15 PM Eulas Post, MD [facility]   Future Orders Complete By Expires   Diet - low sodium heart healthy  As directed    Discharge instructions  As directed    Comments:     Take medications as prescribed Follow with Orthopedic surgery and Infectious Disease clinic as instructed Follow a low sodium diet Patient needs follow up with Gynecology for further evaluation and treatment of vaginal bleeding CBC in 5 days to follow Hgb trend BMET and CK level weekly to follow electrolytes, renal function and muscle enzymes while receiving Daptomycin       Medication List         acetaminophen 500 MG tablet  Commonly known as:  TYLENOL  Take 2 tablets (1,000 mg total) by mouth every 6 (six) hours as needed for mild pain, fever or headache.     albuterol 108 (90 BASE) MCG/ACT inhaler  Commonly known as:  PROVENTIL HFA;VENTOLIN HFA  Inhale 2 puffs into the lungs every 6 (six) hours as needed for wheezing or shortness of breath.     amLODipine 5 MG tablet  Commonly known as:  NORVASC  Take 1  tablet (5 mg total) by mouth daily.     aspirin 81 MG tablet  Take 81 mg by mouth daily.     atenolol 25 MG tablet  Commonly known as:  TENORMIN  Take 1 tablet (25 mg total) by mouth daily.     dextrose 5 % SOLN 50 mL with ceFEPIme 2 G SOLR 2 g  Inject 2 g into the vein every 12 (twelve) hours. To be given twice a day every day for 4 more days     enoxaparin 40 MG/0.4ML injection  Commonly known as:  LOVENOX  Inject 0.4 mLs (40 mg total) into the skin daily. For 3 weeks     feeding supplement (ENSURE) Pudg  Take 1 Container by mouth daily.     feeding supplement (GLUCERNA SHAKE) Liqd  Take 237 mLs by mouth daily as needed (Please offer if pt eats <75% of breakfast).     furosemide 40 MG tablet  Commonly known as:  LASIX  Take 1 tablet (40 mg total) by mouth 2 (two) times daily.     HYDROcodone-acetaminophen 5-325 MG per tablet  Commonly known as:  NORCO/VICODIN  Take 1 tablet by mouth every 6 (six) hours as needed for moderate pain.     insulin aspart 100  UNIT/ML injection  Commonly known as:  novoLOG  Inject 1-9 Units into the skin 3 (three) times daily before meals.     levothyroxine 125 MCG tablet  Commonly known as:  SYNTHROID, LEVOTHROID  Take 125 mcg by mouth daily before breakfast.     multivitamin with minerals Tabs tablet  Take 1 tablet by mouth daily.     pantoprazole 40 MG tablet  Commonly known as:  PROTONIX  Take 1 tablet (40 mg total) by mouth daily at 6 (six) AM.     potassium chloride SA 20 MEQ tablet  Commonly known as:  K-DUR,KLOR-CON  Take 2 tablets (40 mEq total) by mouth daily.     sodium chloride 0.9 % SOLN 100 mL with DAPTOmycin 500 MG SOLR 500 mg  Inject 500 mg into the vein daily. Till 2/27       Allergies  Allergen Reactions  . Meloxicam     REACTION: itiching       Follow-up Information   Follow up with Scharlene Gloss, MD. Call on 10/11/2013. (as scheduled )    Specialty:  Infectious Diseases   Contact information:   [address]  [place] [address] [phone]       Follow up with Mauri Pole, MD. Schedule an appointment as soon as possible for a visit in 2 weeks.   Specialty:  Orthopedic Surgery   Contact information:   [address] [place] [address] [phone]       Follow up with GREWAL,MICHELLE L, MD. Schedule an appointment as soon as possible for a visit in 1 week. (followup vaginal bleeding )    Specialty:  Obstetrics and Gynecology   Contact information:   [address] [place] [address] [phone]       The results of significant diagnostics from this hospitalization (including imaging, microbiology, ancillary and laboratory) are listed below for reference.    Significant Diagnostic Studies: Dg Chest 2 View (if Patient Has Fever And/or Copd)  09/30/2013   CLINICAL DATA:  Shortness of breath and weakness.  EXAM: CHEST  2 VIEW  COMPARISON:  Chest radiograph September 22, 2013.  FINDINGS: Cardiac silhouette remains enlarged, similar. Mediastinal silhouette is nonsuspicious. Similar central pulmonary vasculature congestion interstitial prominence. Suspected small pleural effusion. Strandy densities in the lung bases. No pneumothorax.  Right PICC distal tip projects in proximal superior vena cava. Large body habitus. Surgical clips project in the left breast. Multiple EKG lines overlie the patient and may obscure subtle underlying pathology. Osseous structures are unchanged.  IMPRESSION: Stable cardiomegaly and interstitial prominence suggesting pulmonary edema with suspected trace pleural effusion.  No apparent change in position of right PICC.   Electronically Signed   By: Elon Alas   On: 09/30/2013 00:27   Dg Chest 2 View  09/19/2013   CLINICAL DATA:  Shortness of breath and weakness .  EXAM: CHEST  2 VIEW  COMPARISON:  09/15/2013.  FINDINGS: PICC line in good anatomic position. Cardiomegaly with pulmonary venous congestion and  bilateral interstitial prominence noted consistent with congestive heart failure and interstitial edema. No pleural effusion or pneumothorax. No acute osseous abnormality.  IMPRESSION: 1. PICC line in good anatomic position. 2. Congestive heart failure with pulmonary interstitial edema.   Electronically Signed   By: Marcello Moores  Register   On: 09/19/2013 10:23   Dg Knee 1-2 Views Left  09/13/2013   CLINICAL DATA:  Bilateral knee pain without trauma.  EXAM: LEFT KNEE -  1-2 VIEW  COMPARISON:  DG KNEE 2 VIEWS*L* dated 06/17/2008  FINDINGS: Two views of the left knee demonstrate prior arthroplasty. No acute hardware complication. Limited evaluation for joint effusion, secondary to patient body habitus.  Two views of the right knee demonstrate prior arthroplasty. No acute hardware complication. Question anterior and prepatellar soft tissue swelling versus secondary to patient body habitus. Extremely limited evaluation for joint effusion, secondary to patient body habitus and possible overlying soft tissue swelling.  IMPRESSION: Expected appearance after bilateral knee arthroplasties. No acute hardware complication.  Limited evaluation for joint effusion, especially on the right.   Electronically Signed   By: Abigail Miyamoto M.D.   On: 09/13/2013 12:04   Dg Knee 2 Views Right  09/20/2013   CLINICAL DATA:  Pain  EXAM: RIGHT KNEE - 1-2 VIEW  COMPARISON:  Jan 30, 2013  FINDINGS: Frontal and lateral views were obtained. Patient has undergone total knee replacement with femoral and tibial components appearing well-seated There is no fracture or dislocation. There is a moderate joint effusion. No bony destruction.  IMPRESSION: Joint effusion. Prosthetic components appear well seated. No acute fracture.   Electronically Signed   By: Lowella Grip M.D.   On: 09/20/2013 10:13   Ct Angio Chest Pe W/cm &/or Wo Cm  09/30/2013   CLINICAL DATA:  Recent right knee surgery, shortness of breath and tachycardia.  EXAM: CT ANGIOGRAPHY  CHEST WITH CONTRAST  TECHNIQUE: Multidetector CT imaging of the chest was performed using the standard protocol during bolus administration of intravenous contrast. Multiplanar CT image reconstructions including MIPs were obtained to evaluate the vascular anatomy.  CONTRAST:  154m OMNIPAQUE IOHEXOL 350 MG/ML SOLN  COMPARISON:  Chest radiograph September 30, 2013 and CT of the chest August 07, 2008  FINDINGS: Suboptimal bolus timing, no pulmonary arterial filling defects to at least the segmental branches, limited assessment of the subsegmental pulmonary arteries. Main pulmonary artery is not enlarged.  Heart size is mildly enlarged. Mild mitral annular calcifications. Pericardium is unremarkable. Thoracic esophagus is normal in course and caliber with mild calcific atherosclerosis. 13 mm pretracheal lymph node was 13 mm. Subcentimeter aortopulmonary window lymph nodes are unchanged. No lymphadenopathy by CT size criteria.  Heterogeneous lung attenuation, with mild tree-in-bud appearance of the right upper lobe, to a lesser extent right middle lobe and right lower lobe as well as the lingula. Patchy ground-glass opacities. Small right pleural effusion with atelectasis bilaterally. Tracheobronchial tree is patent and midline, no pneumothorax.  The included view of the abdomen is nonsuspicious. Status post suspected right thyroidectomy, incompletely characterized. Right PICC in place, distal tip in mid superior vena cava. Osseous structures are nonsuspicious.  Review of the MIP images confirms the above findings.  IMPRESSION: Suboptimal bolus timing without pulmonary embolism to at least the segmental branches.  Tree-in-bud infiltrates with patchy ground-glass opacities and air trapping with small right pleural effusion, constellation of the findings are concerning for bronchiolitis/ early pneumonia.  Stable mediastinal lymphadenopathy.   Electronically Signed   By: CElon Alas  On: 09/30/2013 03:32   [place] Transvaginal Non-ob  10/01/2013   CLINICAL DATA:  Vaginal bleeding  EXAM: TRANSABDOMINAL AND TRANSVAGINAL ULTRASOUND OF PELVIS  TECHNIQUE: Both transabdominal and transvaginal ultrasound examinations of the pelvis were performed. Transabdominal technique was performed for global imaging of the pelvis including uterus, ovaries, adnexal regions, and pelvic cul-de-sac. It was necessary to proceed with endovaginal exam following the transabdominal exam to visualize the endometrium.  COMPARISON:  None  FINDINGS: Uterus  Measurements: 11.3 x 5.2 x 7.0 cm. There is partially calcified fibroid within the uterine fundus measuring 5.5 by 4.6 x 6.3 cm. Hypoechoic endometrial lesion is identified measuring 1.8 x 1.5 x 3.1 cm. No significant blood flow identified within this structure.  Endometrium  Thickness: 31.6 mm.  No focal abnormality visualized.  Right ovary  Measurements: 6.4 x 5.8 x 6.3 cm. There is a cyst within the right ovary measuring 5 x 4.9 x 5.4 cm. This appears to have a mural nodule which measures approximately 2.2 cm.  Left ovary  Not visualized.  Other findings  Multiple nabothian cysts noted.  IMPRESSION: 1. The endometrium is thickened and there is a suspicious hypoechoic nodule present. In the setting of post-menopausal bleeding, endometrial sampling is indicated to exclude carcinoma. If results are benign, sonohysterogram should be considered for focal lesion work-up prior to hysteroscopy. (Ref: Radiological Reasoning: Algorithmic Workup of Abnormal Vaginal Bleeding with Endovaginal Sonography and Sonohysterography. AJR 2008; 749:S49-67) 2. Complicated cyst is identified within the left ovary containing what appears to be a mural nodule. Further assessment with pelvic MRI or surgical evaluation is advised.   Electronically Signed   By: Kerby Moors M.D.   On: 10/01/2013 14:57   US Pelvis Complete  10/01/2013   CLINICAL DATA:  Vaginal bleeding  EXAM: TRANSABDOMINAL AND TRANSVAGINAL ULTRASOUND OF  PELVIS  TECHNIQUE: Both transabdominal and transvaginal ultrasound examinations of the pelvis were performed. Transabdominal technique was performed for global imaging of the pelvis including uterus, ovaries, adnexal regions, and pelvic cul-de-sac. It was necessary to proceed with endovaginal exam following the transabdominal exam to visualize the endometrium.  COMPARISON:  None  FINDINGS: Uterus  Measurements: 11.3 x 5.2 x 7.0 cm. There is partially calcified fibroid within the uterine fundus measuring 5.5 by 4.6 x 6.3 cm. Hypoechoic endometrial lesion is identified measuring 1.8 x 1.5 x 3.1 cm. No significant blood flow identified within this structure.  Endometrium  Thickness: 31.6 mm.  No focal abnormality visualized.  Right ovary  Measurements: 6.4 x 5.8 x 6.3 cm. There is a cyst within the right ovary measuring 5 x 4.9 x 5.4 cm. This appears to have a mural nodule which measures approximately 2.2 cm.  Left ovary  Not visualized.  Other findings  Multiple nabothian cysts noted.  IMPRESSION: 1. The endometrium is thickened and there is a suspicious hypoechoic nodule present. In the setting of post-menopausal bleeding, endometrial sampling is indicated to exclude carcinoma. If results are benign, sonohysterogram should be considered for focal lesion work-up prior to hysteroscopy. (Ref: Radiological Reasoning: Algorithmic Workup of Abnormal Vaginal Bleeding with Endovaginal Sonography and Sonohysterography. AJR 2008; 591:M38-46) 2. Complicated cyst is identified within the left ovary containing what appears to be a mural nodule. Further assessment with pelvic MRI or surgical evaluation is advised.   Electronically Signed   By: Kerby Moors M.D.   On: 10/01/2013 14:57   Ir Fluoro Guide Cv Line Right  09/19/2013   EXAM: ULTRASOUND AND FLUOROSCOPIC GUIDED PICC LINE INSERTION  MEDICATIONS: None.  TECHNIQUE: The procedure, risks, benefits, and alternatives were explained to the patient and informed written consent  was obtained. A timeout was performed prior to the initiation of the procedure.  The right upper extremity was prepped with chlorhexidine in a sterile fashion, and a sterile drape was applied covering the operative field. Maximum barrier sterile technique with sterile gowns and gloves were used for the procedure. A timeout was performed prior to the initiation of the procedure. Local anesthesia  was provided with 1% lidocaine.  Under direct ultrasound guidance, the right brachial vein was accessed with a micropuncture kit after the overlying soft tissues were anesthetized with 1% lidocaine. An ultrasound image was saved for documentation purposes. A guidewire was advanced to the level of the superior caval-atrial junction for measurement purposes and the PICC line was cut to length. A peel-away sheath was placed and a 42 cm, 5 [place], dual lumen was inserted to level of the superior caval-atrial junction. A post procedure spot fluoroscopic was obtained. The catheter easily aspirated and flushed and was sutured in place. A dressing was placed. The patient tolerated the procedure well without immediate post procedural complication.  CONTRAST:  None  FLUOROSCOPY TIME:  1 min, 30 seconds  COMPLICATIONS: None immediate  FINDINGS: After catheter placement, the tip lies within the superior cavoatrial junction. The catheter aspirates and flushes normally and is ready for immediate use.  IMPRESSION: Successful ultrasound and fluoroscopic guided placement of a right brachial vein approach, 42 cm, 5 French, dual lumen PICC with tip at the superior caval-atrial junction. The PICC line is ready for immediate use.  INDICATION: Poor venous access, in need of intravenous access for medication administration and blood draws   Electronically Signed   By: Sandi Mariscal M.D.   On: 09/19/2013 11:44   Ir US Guide Vasc Access Right  09/19/2013   EXAM: ULTRASOUND AND FLUOROSCOPIC GUIDED PICC LINE INSERTION  MEDICATIONS: None.  TECHNIQUE:  The procedure, risks, benefits, and alternatives were explained to the patient and informed written consent was obtained. A timeout was performed prior to the initiation of the procedure.  The right upper extremity was prepped with chlorhexidine in a sterile fashion, and a sterile drape was applied covering the operative field. Maximum barrier sterile technique with sterile gowns and gloves were used for the procedure. A timeout was performed prior to the initiation of the procedure. Local anesthesia was provided with 1% lidocaine.  Under direct ultrasound guidance, the right brachial vein was accessed with a micropuncture kit after the overlying soft tissues were anesthetized with 1% lidocaine. An ultrasound image was saved for documentation purposes. A guidewire was advanced to the level of the superior caval-atrial junction for measurement purposes and the PICC line was cut to length. A peel-away sheath was placed and a 42 cm, 5 [place], dual lumen was inserted to level of the superior caval-atrial junction. A post procedure spot fluoroscopic was obtained. The catheter easily aspirated and flushed and was sutured in place. A dressing was placed. The patient tolerated the procedure well without immediate post procedural complication.  CONTRAST:  None  FLUOROSCOPY TIME:  1 min, 30 seconds  COMPLICATIONS: None immediate  FINDINGS: After catheter placement, the tip lies within the superior cavoatrial junction. The catheter aspirates and flushes normally and is ready for immediate use.  IMPRESSION: Successful ultrasound and fluoroscopic guided placement of a right brachial vein approach, 42 cm, 5 French, dual lumen PICC with tip at the superior caval-atrial junction. The PICC line is ready for immediate use.  INDICATION: Poor venous access, in need of intravenous access for medication administration and blood draws   Electronically Signed   By: Sandi Mariscal M.D.   On: 09/19/2013 11:44   Dg Chest Port 1  View  09/22/2013   CLINICAL DATA:  Postoperative from right knee surgery now with hypoxia  EXAM: PORTABLE CHEST - 1 VIEW  COMPARISON:  PA and lateral chest x-ray of September 19, 2013  FINDINGS: The lungs  are reasonably well inflated. The interstitial markings are increased bilaterally. The cardiopericardial silhouette remains enlarged. The pulmonary vascularity appears more engorged. There is a right-sided PICC line in place with the tip in the region of the proximal SVC which is stable. No pleural effusion is evident.  IMPRESSION: The findings are worrisome for pulmonary interstitial edema which may be of cardiac or noncardiac cause. No discrete alveolar pneumonia is demonstrated.   Electronically Signed   By: David  Martinique   On: 09/22/2013 13:44   Dg Chest Port 1 View  09/15/2013   CLINICAL DATA:  Infection, shortness of breath  EXAM: PORTABLE CHEST - 1 VIEW  COMPARISON:  01/22/2013  FINDINGS: Cardiomegaly evident with increased vascular and interstitial prominence, suspect early edema. No large effusion or pneumothorax. No definite focal pneumonia or collapse. Trachea is midline. Postop changes in the right thyroid region.  IMPRESSION: Cardiomegaly with early interstitial edema pattern.   Electronically Signed   By: Daryll Brod M.D.   On: 09/15/2013 13:13    Microbiology: Recent Results (from the past 240 hour(s))  MRSA PCR SCREENING     Status: None   Collection Time    09/30/13  9:13 AM      Result Value Range Status   MRSA by PCR NEGATIVE  NEGATIVE Final   Comment:            The GeneXpert MRSA Assay (FDA     approved for NASAL specimens     only), is one component of a     comprehensive MRSA colonization     surveillance program. It is not     intended to diagnose MRSA     infection nor to guide or     monitor treatment for     MRSA infections.     Labs: Basic Metabolic Panel:  Recent Labs Lab 09/30/13 0038 09/30/13 1030 10/01/13 0454 10/02/13 0450 10/03/13 0424  NA 137   --  139 141 140  K 3.7  --  3.4* 3.6* 4.9  CL 96  --  96 98 100  CO2 32  --  33* 32 29  GLUCOSE 143*  --  121* 127* 114*  BUN 25*  --  27* 28* 34*  CREATININE 1.35* 1.34* 1.29* 1.35* 1.75*  CALCIUM 7.9*  --  7.7* 7.7* 8.0*  MG  --   --   --  1.6  --    Liver Function Tests:  Recent Labs Lab 10/02/13 0450  AST 21  ALT 11  ALKPHOS 113  BILITOT 0.3  PROT 6.8  ALBUMIN 1.7*   CBC:  Recent Labs Lab 09/30/13 0038 09/30/13 1030 10/01/13 0454 10/02/13 0450 10/03/13 0424  WBC 6.0 4.9 4.7 5.4  --   NEUTROABS  --  3.4  --   --   --   HGB 9.1* 8.6* 8.5* 8.2* 8.6*  HCT 29.2* 26.5* 26.0* 25.6* 27.2*  MCV 94.8 94.0 94.9 94.8  --   PLT 239 224 192 176  --    Cardiac Enzymes:  Recent Labs Lab 10/03/13 0424  CKTOTAL 45   BNP: BNP (last 3 results)  Recent Labs  09/20/13 0550 09/30/13 0038 09/30/13 1030  PROBNP 1310.0* 2013.0* 2097.0*   CBG:  Recent Labs Lab 10/02/13 0714 10/02/13 1130 10/02/13 1548 10/02/13 2159 10/03/13 0628  GLUCAP 110* 110* 161* 117* 112*    Signed:  Lyal Husted  [facility] 10/03/2013, 11:51 AM

## 2013-10-11 ENCOUNTER — Encounter: Payer: Self-pay | Admitting: Internal Medicine

## 2013-10-11 ENCOUNTER — Ambulatory Visit (INDEPENDENT_AMBULATORY_CARE_PROVIDER_SITE_OTHER): Payer: Medicare Other | Admitting: Internal Medicine

## 2013-10-11 VITALS — BP 146/71 | HR 68 | Resp 32

## 2013-10-11 DIAGNOSIS — T8450XA Infection and inflammatory reaction due to unspecified internal joint prosthesis, initial encounter: Secondary | ICD-10-CM

## 2013-10-11 DIAGNOSIS — Z96659 Presence of unspecified artificial knee joint: Secondary | ICD-10-CM

## 2013-10-11 DIAGNOSIS — T8453XA Infection and inflammatory reaction due to internal right knee prosthesis, initial encounter: Secondary | ICD-10-CM

## 2013-10-11 NOTE — Progress Notes (Signed)
   Subjective:    Patient ID: Amy Burns, female    DOB: 1941/03/23, 73 y.o.   MRN: [id number]  HPI Amy Burns is an 73 y.o. female with past neural history significant for bilateral knee replacement presents to the emergency room at [facility] with a one-week history of severe constant throbbing pain in her knee rate in the right ankle which was 10 out of 10 in severity. with erythema and swelling, worse with ambulation and somewhat improved with rest. She was admitted to the hospitalist service and placed on vancomycin and cefepime initially. Orthopedic surgery aspirated her knee and this yielded 138,118 white blood cells with 82% neutrophils. Cultures from the knee has some slight grown methicillin-resistant staph aureus. Patient was taken to the operating room on 09/22/2013 by Dr Alvan Dame and underwent PROCEDURE: Resection of right total knee arthroplasty, placement of  antibiotic spacer. The patient's postoperative course has been complicated by renal insufficiency and supratherapeutic vancomycin levels.  She has been on daptomycin and is scheduled to continue through February 27th.  Her picc line is infusing but have not been able to use for blood drawing.  No fever, no chills.      Review of Systems  Constitutional: Negative for fever and chills.  Gastrointestinal: Negative for diarrhea.  Musculoskeletal: Negative for joint swelling.  Neurological: Negative for dizziness and light-headedness.       Objective:   Physical Exam  Constitutional:  Morbidly obese  Eyes: No scleral icterus.  Pulmonary/Chest:  Some respiratory distress noted but relieved with oxygen  Musculoskeletal: She exhibits no edema.  Lymphadenopathy:    She has no cervical adenopathy.  Skin: No rash noted.          Assessment & Plan:

## 2013-10-11 NOTE — Assessment & Plan Note (Signed)
Continue with daptomycin.  I have asked for esr and crp to be repeated at facility.  rtc prior to stopping antibiotics to be sure she is doing well.

## 2013-10-12 ENCOUNTER — Inpatient Hospital Stay (HOSPITAL_COMMUNITY)
Admission: EM | Admit: 2013-10-12 | Discharge: 2013-11-04 | DRG: 682 | Disposition: E | Payer: Medicare Other | Attending: Emergency Medicine | Admitting: Emergency Medicine

## 2013-10-12 ENCOUNTER — Emergency Department (HOSPITAL_COMMUNITY): Payer: Medicare Other

## 2013-10-12 ENCOUNTER — Encounter (HOSPITAL_COMMUNITY): Payer: Self-pay | Admitting: Emergency Medicine

## 2013-10-12 DIAGNOSIS — I10 Essential (primary) hypertension: Secondary | ICD-10-CM | POA: Diagnosis present

## 2013-10-12 DIAGNOSIS — E875 Hyperkalemia: Secondary | ICD-10-CM | POA: Diagnosis present

## 2013-10-12 DIAGNOSIS — E872 Acidosis, unspecified: Secondary | ICD-10-CM | POA: Diagnosis present

## 2013-10-12 DIAGNOSIS — N179 Acute kidney failure, unspecified: Secondary | ICD-10-CM | POA: Diagnosis present

## 2013-10-12 DIAGNOSIS — I5033 Acute on chronic diastolic (congestive) heart failure: Secondary | ICD-10-CM

## 2013-10-12 DIAGNOSIS — N189 Chronic kidney disease, unspecified: Secondary | ICD-10-CM | POA: Diagnosis present

## 2013-10-12 DIAGNOSIS — Z8249 Family history of ischemic heart disease and other diseases of the circulatory system: Secondary | ICD-10-CM

## 2013-10-12 DIAGNOSIS — E039 Hypothyroidism, unspecified: Secondary | ICD-10-CM | POA: Diagnosis present

## 2013-10-12 DIAGNOSIS — Z6841 Body Mass Index (BMI) 40.0 and over, adult: Secondary | ICD-10-CM

## 2013-10-12 DIAGNOSIS — J189 Pneumonia, unspecified organism: Secondary | ICD-10-CM

## 2013-10-12 DIAGNOSIS — I129 Hypertensive chronic kidney disease with stage 1 through stage 4 chronic kidney disease, or unspecified chronic kidney disease: Secondary | ICD-10-CM | POA: Diagnosis present

## 2013-10-12 DIAGNOSIS — Z86711 Personal history of pulmonary embolism: Secondary | ICD-10-CM

## 2013-10-12 DIAGNOSIS — Z7982 Long term (current) use of aspirin: Secondary | ICD-10-CM

## 2013-10-12 DIAGNOSIS — J9600 Acute respiratory failure, unspecified whether with hypoxia or hypercapnia: Secondary | ICD-10-CM | POA: Diagnosis not present

## 2013-10-12 DIAGNOSIS — I509 Heart failure, unspecified: Secondary | ICD-10-CM

## 2013-10-12 DIAGNOSIS — T8453XA Infection and inflammatory reaction due to internal right knee prosthesis, initial encounter: Secondary | ICD-10-CM

## 2013-10-12 DIAGNOSIS — Z96659 Presence of unspecified artificial knee joint: Secondary | ICD-10-CM

## 2013-10-12 DIAGNOSIS — R0600 Dyspnea, unspecified: Secondary | ICD-10-CM

## 2013-10-12 DIAGNOSIS — N170 Acute kidney failure with tubular necrosis: Principal | ICD-10-CM | POA: Diagnosis present

## 2013-10-12 DIAGNOSIS — Z515 Encounter for palliative care: Secondary | ICD-10-CM

## 2013-10-12 DIAGNOSIS — J810 Acute pulmonary edema: Secondary | ICD-10-CM | POA: Diagnosis present

## 2013-10-12 DIAGNOSIS — D649 Anemia, unspecified: Secondary | ICD-10-CM | POA: Diagnosis present

## 2013-10-12 DIAGNOSIS — E119 Type 2 diabetes mellitus without complications: Secondary | ICD-10-CM

## 2013-10-12 DIAGNOSIS — E662 Morbid (severe) obesity with alveolar hypoventilation: Secondary | ICD-10-CM | POA: Diagnosis present

## 2013-10-12 DIAGNOSIS — Z853 Personal history of malignant neoplasm of breast: Secondary | ICD-10-CM

## 2013-10-12 DIAGNOSIS — R4182 Altered mental status, unspecified: Secondary | ICD-10-CM | POA: Diagnosis present

## 2013-10-12 DIAGNOSIS — Z794 Long term (current) use of insulin: Secondary | ICD-10-CM

## 2013-10-12 DIAGNOSIS — J811 Chronic pulmonary edema: Secondary | ICD-10-CM

## 2013-10-12 DIAGNOSIS — Z66 Do not resuscitate: Secondary | ICD-10-CM | POA: Diagnosis not present

## 2013-10-12 DIAGNOSIS — G4733 Obstructive sleep apnea (adult) (pediatric): Secondary | ICD-10-CM | POA: Diagnosis present

## 2013-10-12 DIAGNOSIS — G9340 Encephalopathy, unspecified: Secondary | ICD-10-CM | POA: Diagnosis present

## 2013-10-12 LAB — POTASSIUM
POTASSIUM: 7.5 meq/L — AB (ref 3.7–5.3)
Potassium: >7.7 mEq/L (ref 3.7–5.3)

## 2013-10-12 LAB — BASIC METABOLIC PANEL
BUN: 69 mg/dL — ABNORMAL HIGH (ref 6–23)
BUN: 68 mg/dL — AB (ref 6–23)
CALCIUM: 8.5 mg/dL (ref 8.4–10.5)
CHLORIDE: 107 meq/L (ref 96–112)
CO2: 24 mEq/L (ref 19–32)
CO2: 21 mEq/L (ref 19–32)
CREATININE: 2.13 mg/dL — AB (ref 0.50–1.10)
Calcium: 7.7 mg/dL — ABNORMAL LOW (ref 8.4–10.5)
Chloride: 105 mEq/L (ref 96–112)
Creatinine, Ser: 2.1 mg/dL — ABNORMAL HIGH (ref 0.50–1.10)
GFR calc non Af Amer: 22 mL/min — ABNORMAL LOW (ref >90)
GFR calc non Af Amer: 22 mL/min — ABNORMAL LOW (ref >90)
GFR, EST AFRICAN AMERICAN: 25 mL/min — AB (ref >90)
GFR, EST AFRICAN AMERICAN: 26 mL/min — AB (ref >90)
Glucose, Bld: 162 mg/dL — ABNORMAL HIGH (ref 70–99)
Glucose, Bld: 136 mg/dL — ABNORMAL HIGH (ref 70–99)
POTASSIUM: 6.4 meq/L — AB (ref 3.7–5.3)
Potassium: >7.7 mEq/L (ref 3.7–5.3)
SODIUM: 139 meq/L (ref 137–147)
Sodium: 139 mEq/L (ref 137–147)

## 2013-10-12 LAB — POCT I-STAT 3, ART BLOOD GAS (G3+)
BICARBONATE: 26.5 meq/L — AB (ref 20.0–24.0)
O2 Saturation: 99 %
PO2 ART: 138 mmHg — AB (ref 80.0–100.0)
TCO2: 28 mmol/L (ref 0–100)
pCO2 arterial: 51.1 mmHg — ABNORMAL HIGH (ref 35.0–45.0)
pH, Arterial: 7.322 — ABNORMAL LOW (ref 7.350–7.450)

## 2013-10-12 LAB — URINALYSIS, ROUTINE W REFLEX MICROSCOPIC
BILIRUBIN URINE: NEGATIVE
Glucose, UA: NEGATIVE mg/dL
Ketones, ur: NEGATIVE mg/dL
Nitrite: NEGATIVE
PH: 5 (ref 5.0–8.0)
Specific Gravity, Urine: 1.017 (ref 1.005–1.030)
Urobilinogen, UA: 0.2 mg/dL (ref 0.0–1.0)

## 2013-10-12 LAB — URINE MICROSCOPIC-ADD ON

## 2013-10-12 LAB — GLUCOSE, CAPILLARY: Glucose-Capillary: 158 mg/dL — ABNORMAL HIGH (ref 70–99)

## 2013-10-12 LAB — CBC WITH DIFFERENTIAL/PLATELET
BASOS ABS: 0 10*3/uL (ref 0.0–0.1)
BASOS PCT: 0 % (ref 0–1)
EOS PCT: 1 % (ref 0–5)
Eosinophils Absolute: 0.1 10*3/uL (ref 0.0–0.7)
HCT: 27.2 % — ABNORMAL LOW (ref 36.0–46.0)
Hemoglobin: 8.2 g/dL — ABNORMAL LOW (ref 12.0–15.0)
Lymphocytes Relative: 7 % — ABNORMAL LOW (ref 12–46)
Lymphs Abs: 0.6 10*3/uL — ABNORMAL LOW (ref 0.7–4.0)
MCH: 30.1 pg (ref 26.0–34.0)
MCHC: 30.1 g/dL (ref 30.0–36.0)
MCV: 100 fL (ref 78.0–100.0)
MONO ABS: 0.7 10*3/uL (ref 0.1–1.0)
Monocytes Relative: 8 % (ref 3–12)
Neutro Abs: 7.2 10*3/uL (ref 1.7–7.7)
Neutrophils Relative %: 84 % — ABNORMAL HIGH (ref 43–77)
Platelets: 231 10*3/uL (ref 150–400)
RBC: 2.72 MIL/uL — ABNORMAL LOW (ref 3.87–5.11)
RDW: 18.4 % — ABNORMAL HIGH (ref 11.5–15.5)
WBC: 8.6 10*3/uL (ref 4.0–10.5)

## 2013-10-12 LAB — MRSA PCR SCREENING: MRSA by PCR: NEGATIVE

## 2013-10-12 LAB — PROTIME-INR
INR: 1.31 (ref 0.00–1.49)
PROTHROMBIN TIME: 16 s — AB (ref 11.6–15.2)

## 2013-10-12 LAB — POCT I-STAT TROPONIN I: Troponin i, poc: 0.02 ng/mL (ref 0.00–0.08)

## 2013-10-12 LAB — APTT: APTT: 33 s (ref 24–37)

## 2013-10-12 LAB — PRO B NATRIURETIC PEPTIDE: Pro B Natriuretic peptide (BNP): 1730 pg/mL — ABNORMAL HIGH (ref 0–125)

## 2013-10-12 MED ORDER — DEXTROSE 5 % IV SOLN
2.0000 g | INTRAVENOUS | Status: DC
  Administered 2013-10-13: 2 g via INTRAVENOUS
  Filled 2013-10-12: qty 2

## 2013-10-12 MED ORDER — SODIUM CHLORIDE 0.9 % IV SOLN
INTRAVENOUS | Status: AC
  Administered 2013-10-12: 14:00:00 via INTRAVENOUS

## 2013-10-12 MED ORDER — VANCOMYCIN HCL 10 G IV SOLR
2000.0000 mg | Freq: Once | INTRAVENOUS | Status: AC
  Administered 2013-10-12: 2000 mg via INTRAVENOUS
  Filled 2013-10-12: qty 2000

## 2013-10-12 MED ORDER — SODIUM POLYSTYRENE SULFONATE 15 GM/60ML PO SUSP
15.0000 g | Freq: Once | ORAL | Status: AC
  Administered 2013-10-12: 15 g via ORAL
  Filled 2013-10-12: qty 60

## 2013-10-12 MED ORDER — INSULIN ASPART 100 UNIT/ML ~~LOC~~ SOLN
10.0000 [IU] | Freq: Once | SUBCUTANEOUS | Status: AC
  Administered 2013-10-12: 10 [IU] via INTRAVENOUS
  Filled 2013-10-12: qty 1

## 2013-10-12 MED ORDER — HYDROCODONE-ACETAMINOPHEN 5-325 MG PO TABS
1.0000 | ORAL_TABLET | Freq: Four times a day (QID) | ORAL | Status: DC | PRN
  Administered 2013-10-12 – 2013-10-14 (×4): 1 via ORAL
  Filled 2013-10-12 (×5): qty 1

## 2013-10-12 MED ORDER — ACETAMINOPHEN 500 MG PO TABS
1000.0000 mg | ORAL_TABLET | Freq: Four times a day (QID) | ORAL | Status: DC | PRN
  Administered 2013-10-13: 1000 mg via ORAL
  Filled 2013-10-12: qty 2

## 2013-10-12 MED ORDER — ONDANSETRON HCL 4 MG PO TABS: 4.0000 mg | ORAL_TABLET | Freq: Four times a day (QID) | ORAL | Status: DC | PRN

## 2013-10-12 MED ORDER — SODIUM CHLORIDE 0.9 % IV BOLUS (SEPSIS)
500.0000 mL | Freq: Once | INTRAVENOUS | Status: AC
  Administered 2013-10-12: 500 mL via INTRAVENOUS

## 2013-10-12 MED ORDER — SODIUM CHLORIDE 0.9 % IV SOLN
500.0000 mg | INTRAVENOUS | Status: DC
  Administered 2013-10-12 – 2013-10-13 (×2): 500 mg via INTRAVENOUS
  Filled 2013-10-12 (×5): qty 10

## 2013-10-12 MED ORDER — ALBUTEROL SULFATE (2.5 MG/3ML) 0.083% IN NEBU
5.0000 mg | INHALATION_SOLUTION | Freq: Once | RESPIRATORY_TRACT | Status: AC
  Administered 2013-10-12: 5 mg via RESPIRATORY_TRACT
  Filled 2013-10-12: qty 6

## 2013-10-12 MED ORDER — DEXTROSE 50 % IV SOLN
1.0000 | Freq: Once | INTRAVENOUS | Status: AC
  Administered 2013-10-12: 50 mL via INTRAVENOUS
  Filled 2013-10-12: qty 50

## 2013-10-12 MED ORDER — SODIUM CHLORIDE 0.9 % IJ SOLN
10.0000 mL | Freq: Two times a day (BID) | INTRAMUSCULAR | Status: DC
  Administered 2013-10-12: 10 mL

## 2013-10-12 MED ORDER — VANCOMYCIN HCL 10 G IV SOLR: 1500.0000 mg | INTRAVENOUS | Status: DC

## 2013-10-12 MED ORDER — INSULIN ASPART 100 UNIT/ML ~~LOC~~ SOLN: 0.0000 [IU] | Freq: Every day | SUBCUTANEOUS | Status: DC

## 2013-10-12 MED ORDER — SODIUM POLYSTYRENE SULFONATE 15 GM/60ML PO SUSP
45.0000 g | Freq: Once | ORAL | Status: AC | PRN
  Filled 2013-10-12: qty 180

## 2013-10-12 MED ORDER — TECHNETIUM TO 99M ALBUMIN AGGREGATED
6.0000 | Freq: Once | INTRAVENOUS | Status: AC | PRN
  Administered 2013-10-12: 6 via INTRAVENOUS

## 2013-10-12 MED ORDER — FUROSEMIDE 40 MG PO TABS
40.0000 mg | ORAL_TABLET | Freq: Two times a day (BID) | ORAL | Status: DC
Start: 2013-10-12 — End: 2013-10-13
  Administered 2013-10-13: 40 mg via ORAL
  Filled 2013-10-12 (×4): qty 1

## 2013-10-12 MED ORDER — GLUCERNA SHAKE PO LIQD: 237.0000 mL | Freq: Every day | ORAL | Status: DC | PRN

## 2013-10-12 MED ORDER — ADULT MULTIVITAMIN W/MINERALS CH
1.0000 | ORAL_TABLET | Freq: Every day | ORAL | Status: DC
  Administered 2013-10-13 – 2013-10-14 (×2): 1 via ORAL
  Filled 2013-10-12 (×2): qty 1

## 2013-10-12 MED ORDER — ONDANSETRON HCL 4 MG/2ML IJ SOLN
4.0000 mg | Freq: Four times a day (QID) | INTRAMUSCULAR | Status: DC | PRN
  Administered 2013-10-12: 4 mg via INTRAVENOUS
  Filled 2013-10-12: qty 2

## 2013-10-12 MED ORDER — SODIUM POLYSTYRENE SULFONATE 15 GM/60ML PO SUSP: 30.0000 g | Freq: Once | ORAL | Status: DC

## 2013-10-12 MED ORDER — HEPARIN SODIUM (PORCINE) 5000 UNIT/ML IJ SOLN
5000.0000 [IU] | Freq: Three times a day (TID) | INTRAMUSCULAR | Status: DC
  Administered 2013-10-13 – 2013-10-15 (×7): 5000 [IU] via SUBCUTANEOUS
  Filled 2013-10-12 (×11): qty 1

## 2013-10-12 MED ORDER — PANTOPRAZOLE SODIUM 40 MG PO TBEC
40.0000 mg | DELAYED_RELEASE_TABLET | Freq: Every day | ORAL | Status: DC
  Administered 2013-10-13 – 2013-10-14 (×2): 40 mg via ORAL
  Filled 2013-10-12 (×2): qty 1

## 2013-10-12 MED ORDER — ENSURE PUDDING PO PUDG
1.0000 | Freq: Every day | ORAL | Status: DC
  Administered 2013-10-13 – 2013-10-14 (×2): 1 via ORAL

## 2013-10-12 MED ORDER — ALUM & MAG HYDROXIDE-SIMETH 200-200-20 MG/5ML PO SUSP: 30.0000 mL | Freq: Four times a day (QID) | ORAL | Status: DC | PRN

## 2013-10-12 MED ORDER — ATENOLOL 25 MG PO TABS
25.0000 mg | ORAL_TABLET | Freq: Every day | ORAL | Status: DC
  Administered 2013-10-13: 25 mg via ORAL
  Filled 2013-10-12: qty 1

## 2013-10-12 MED ORDER — SODIUM POLYSTYRENE SULFONATE 15 GM/60ML PO SUSP
30.0000 g | ORAL | Status: AC
  Administered 2013-10-12: 30 g via ORAL
  Filled 2013-10-12: qty 120

## 2013-10-12 MED ORDER — INSULIN ASPART 100 UNIT/ML ~~LOC~~ SOLN
0.0000 [IU] | Freq: Three times a day (TID) | SUBCUTANEOUS | Status: DC
  Administered 2013-10-13 (×2): 2 [IU] via SUBCUTANEOUS
  Administered 2013-10-14: 3 [IU] via SUBCUTANEOUS
  Administered 2013-10-14: 2 [IU] via SUBCUTANEOUS

## 2013-10-12 MED ORDER — IPRATROPIUM-ALBUTEROL 0.5-2.5 (3) MG/3ML IN SOLN
3.0000 mL | Freq: Once | RESPIRATORY_TRACT | Status: AC
  Administered 2013-10-12: 3 mL via RESPIRATORY_TRACT
  Filled 2013-10-12: qty 3

## 2013-10-12 MED ORDER — FUROSEMIDE 10 MG/ML IJ SOLN
40.0000 mg | Freq: Once | INTRAMUSCULAR | Status: AC
  Administered 2013-10-12: 40 mg via INTRAVENOUS
  Filled 2013-10-12: qty 4

## 2013-10-12 MED ORDER — DEXTROSE 5 % IV SOLN
2.0000 g | Freq: Once | INTRAVENOUS | Status: AC
  Administered 2013-10-12: 2 g via INTRAVENOUS

## 2013-10-12 MED ORDER — LEVOTHYROXINE SODIUM 125 MCG PO TABS
125.0000 ug | ORAL_TABLET | Freq: Every day | ORAL | Status: DC
  Administered 2013-10-13 – 2013-10-14 (×2): 125 ug via ORAL
  Filled 2013-10-12 (×3): qty 1

## 2013-10-12 MED ORDER — AMLODIPINE BESYLATE 5 MG PO TABS
5.0000 mg | ORAL_TABLET | Freq: Every day | ORAL | Status: DC
Start: 2013-10-13 — End: 2013-10-13
  Administered 2013-10-13: 5 mg via ORAL
  Filled 2013-10-12: qty 1

## 2013-10-12 MED ORDER — ASPIRIN 81 MG PO CHEW
81.0000 mg | CHEWABLE_TABLET | Freq: Every day | ORAL | Status: DC
  Administered 2013-10-13 – 2013-10-14 (×2): 81 mg via ORAL
  Filled 2013-10-12 (×2): qty 1

## 2013-10-12 MED ORDER — SODIUM CHLORIDE 0.9 % IJ SOLN
10.0000 mL | INTRAMUSCULAR | Status: DC | PRN
  Administered 2013-10-12: 20 mL
  Administered 2013-10-13: 40 mL

## 2013-10-12 MED ORDER — TECHNETIUM TC 99M DIETHYLENETRIAME-PENTAACETIC ACID: 40.0000 | Freq: Once | INTRAVENOUS | Status: DC | PRN

## 2013-10-12 MED ORDER — ALBUTEROL SULFATE (2.5 MG/3ML) 0.083% IN NEBU: 3.0000 mL | INHALATION_SOLUTION | Freq: Four times a day (QID) | RESPIRATORY_TRACT | Status: DC | PRN

## 2013-10-12 NOTE — ED Notes (Signed)
Pt from ortho office, c/o sob. Pt states she has hx of sob, has gotten worse a couple weeks ago, worse with exertion, Pt states she thinks it is anxiety

## 2013-10-12 NOTE — Progress Notes (Signed)
ANTIBIOTIC CONSULT NOTE - INITIAL  Pharmacy Consult for daptomycin  Indication: Burns infection  Allergies  Allergen Reactions  . Meloxicam     REACTION: itiching    Patient Measurements: Height: 5' 1.81" (157 cm) Weight: 283 lb 1.1 oz (128.4 kg) IBW/kg (Calculated) : 49.67 Adjusted Body Weight: 81kg  Vital Signs: Temp: 98.1 F (36.7 C) (02/06 1024) Temp src: Oral (02/06 1024) BP: 102/72 mmHg (02/06 1500) Pulse Rate: 63 (02/06 1500) Intake/Output from previous day:   Intake/Output from this shift: Total I/O In: 500 [I.V.:500] Out: -   Labs:  Recent Labs  11/02/2013 1140  WBC 8.6  HGB 8.2*  PLT 231  CREATININE 2.13*   Estimated Creatinine Clearance: 30.2 ml/min (by C-G formula based on Cr of 2.13). No results found for this basename: VANCOTROUGH, VANCOPEAK, VANCORANDOM, GENTTROUGH, GENTPEAK, GENTRANDOM, TOBRATROUGH, TOBRAPEAK, TOBRARND, AMIKACINPEAK, AMIKACINTROU, AMIKACIN,  in the last 72 hours   Microbiology: Recent Results (from the past 720 hour(s))  ANAEROBIC CULTURE     Status: None   Collection Time    09/13/13  8:00 PM      Result Value Range Status   Specimen Description KNEE RIGHT KNEE Burns   Final   Special Requests ANAC ONLY   Final   Gram Stain     Final   Value: ABUNDANT WBC PRESENT,BOTH PMN AND MONONUCLEAR     NO SQUAMOUS EPITHELIAL CELLS SEEN     MODERATE GRAM POSITIVE COCCI     IN PAIRS IN CLUSTERS     Performed at [facility]   Culture     Final   Value: NO ANAEROBES ISOLATED     Performed at [facility]   Report Status 09/19/2013 FINAL   Final  BODY FLUID CULTURE     Status: None   Collection Time    09/14/13  4:27 PM      Result Value Range Status   Specimen Description KNEE   Final   Special Requests NONE   Final   Gram Stain     Final   Value: ABUNDANT WBC PRESENT,BOTH PMN AND MONONUCLEAR     MODERATE GRAM POSITIVE COCCI     IN PAIRS IN CLUSTERS Gram Stain Report Called to,Read Back By and Verified With: Gram  Stain Report Called to,Read Back By and Verified With: DONNA A @1131  ON 09/15/13 BY SMIAS     Performed at [facility]   Culture     Final   Value: ABUNDANT METHICILLIN RESISTANT STAPHYLOCOCCUS AUREUS     Note: RIFAMPIN AND GENTAMICIN SHOULD NOT BE USED AS SINGLE DRUGS FOR TREATMENT OF STAPH INFECTIONS. CRITICAL RESULT CALLED TO, READ BACK BY AND VERIFIED WITH: CAITLYN TAYLOR 09/16/13 @ 9:12AM BY RUSCOE A.     Performed at [facility]   Report Status 09/16/2013 FINAL   Final   Organism ID, Bacteria METHICILLIN RESISTANT STAPHYLOCOCCUS AUREUS   Final  SURGICAL PCR SCREEN     Status: Abnormal   Collection Time    09/16/13  4:47 PM      Result Value Range Status   MRSA, PCR POSITIVE (*) NEGATIVE Final   Comment: RESULT CALLED TO, READ BACK BY AND VERIFIED WITH:     K.BUTCHEE AT 1818 ON [id number] BY C.BONGEL   Staphylococcus aureus POSITIVE (*) NEGATIVE Final   Comment:            The Xpert SA Assay (FDA     approved for NASAL specimens     in  patients over 67 years of age),     is one component of     a comprehensive surveillance     program.  Test performance has     been validated by [employer] for patients greater     than or equal to 71 year old.     It is not intended     to diagnose infection nor to     guide or monitor treatment.  MRSA PCR SCREENING     Status: Abnormal   Collection Time    09/17/13  7:33 AM      Result Value Range Status   MRSA by PCR POSITIVE (*) NEGATIVE Final   Comment:            The GeneXpert MRSA Assay (FDA     approved for NASAL specimens     only), is one component of a     comprehensive MRSA colonization     surveillance program. It is not     intended to diagnose MRSA     infection nor to guide or     monitor treatment for     MRSA infections.     RESULT CALLED TO, READ BACK BY AND VERIFIED WITH:     E. MACABUAG RN AT 0940 ON 1.12.15 BY SHUEA  URINE CULTURE     Status: None   Collection Time    09/22/13  3:53 PM       Result Value Range Status   Specimen Description URINE, CLEAN CATCH   Final   Special Requests NONE   Final   Culture  Setup Time     Final   Value: 09/22/2013 22:44     Performed at [place]     Final   Value: >=100,000 COLONIES/ML     Performed at [facility]   Culture     Final   Value: YEAST     Performed at [facility]   Report Status 09/23/2013 FINAL   Final  MRSA PCR SCREENING     Status: None   Collection Time    09/30/13  9:13 AM      Result Value Range Status   MRSA by PCR NEGATIVE  NEGATIVE Final   Comment:            The GeneXpert MRSA Assay (FDA     approved for NASAL specimens     only), is one component of a     comprehensive MRSA colonization     surveillance program. It is not     intended to diagnose MRSA     infection nor to guide or     monitor treatment for     MRSA infections.    Medical History: Past Medical History  Diagnosis Date  . HYPOTHYROIDISM 12/11/2008  . 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 HYPERTENSION 12/11/2008  . OSTEOARTHRITIS 12/11/2008  . ALOPECIA 12/11/2008  . Shortness of breath   . Pulmonary embolism     bilateral  . Orthopnea   . Measles     as child  . History of chicken pox     as child  . Cancer     left breast  . Sleep apnea     can not wear CPAP  . Headache(784.0)   . Complication of anesthesia     "woke up after surgery and could not breath"    Medications:  Anti-infectives   Start  Dose/Rate Route Frequency Ordered Stop   10/13/13 1600  vancomycin (VANCOCIN) 1,500 mg in sodium chloride 0.9 % 500 mL IVPB  Status:  Discontinued     1,500 mg 250 mL/hr over 120 Minutes Intravenous Every 24 hours 10/20/2013 1302 10/26/2013 1642   10/13/13 1400  ceFEPIme (MAXIPIME) 2 g in dextrose 5 % 50 mL IVPB     2 g 100 mL/hr over 30 Minutes Intravenous Every 24 hours 10/15/2013 1300     10/18/2013 2000  DAPTOmycin (CUBICIN) 500 mg in sodium chloride 0.9 % IVPB     500 mg 220 mL/hr over 30 Minutes Intravenous  Every 24 hours 10/20/2013 1653     10/25/2013 1400  vancomycin (VANCOCIN) 2,000 mg in sodium chloride 0.9 % 500 mL IVPB     2,000 mg 250 mL/hr over 120 Minutes Intravenous  Once 10/20/2013 1259 10/28/2013 1639   10/30/2013 1330  ceFEPIme (MAXIPIME) 2 g in dextrose 5 % 50 mL IVPB     2 g 100 mL/hr over 30 Minutes Intravenous  Once 10/09/2013 1255 10/07/2013 1453     Assessment: Amy Burns. Per ID outpatient notes, she was to continue on this until 2/27. Scr today is elevated so CrCl is right at 84ml/min. Vancomycin has been stopped d/t previous history of renal dysfunction and high troughs. Last CK checked was WNL.   Goal of Therapy:  Eradication of infection  Plan:  1. Continue daptomycin 500mg  IV Q24H 2. F/u renal fxn, C&S, clinical status and weekly CK (ordered for AM)  Olivia Royse, Rande Lawman 10/25/2013,4:54 PM

## 2013-10-12 NOTE — Progress Notes (Signed)
Pt off floor. Will administer neb when available.

## 2013-10-12 NOTE — ED Notes (Signed)
CRITICAL VALUE ALERT  Critical value received: K >7.7   Date of notification:  10/15/2013  Time of notification:  1237  Critical value read back: Yes   Nurse who received alert:  Elyn Peers   MD notified (1st page):  Md Ward

## 2013-10-12 NOTE — ED Provider Notes (Signed)
TIME SEEN: 11:06 AM  CHIEF COMPLAINT: Shortness of breath  HPI: Patient is a 73 y.o. female with a history of morbid obesity, hypothyroidism, hypertension, bilateral pulmonary emboli, history of left breast cancer, obstructive sleep apnea who presents emergency department with shortness of breath that has been present for the past 5 years but worse over the past 2 weeks. She reports her shortness of breath is worse with exertion and better with rest. She denies any chest pain or chest discomfort. No fevers, cough, vomiting or diarrhea. No bloody stools or melena. She denies a history of asthma or COPD. She has never been a smoker. Patient reports she's not sure if she is on anticoagulation at this time for her prior pulmonary emboli. She did recently have a right knee replacement and was recently treated for hospital-acquired pneumonia. She does not believe she is on antibiotics anymore. She states that she is normally on approximately 2-4 L of oxygen since her knee replacement.  ROS: See HPI Constitutional: no fever  Eyes: no drainage  ENT: no runny nose   Cardiovascular:  no chest pain  Resp:  SOB  GI: no vomiting GU: no dysuria Integumentary: no rash  Allergy: no hives  Musculoskeletal: no leg swelling  Neurological: no slurred speech ROS otherwise negative  PAST MEDICAL HISTORY/PAST SURGICAL HISTORY:  Past Medical History  Diagnosis Date  . HYPOTHYROIDISM 12/11/2008  . 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 HYPERTENSION 12/11/2008  . OSTEOARTHRITIS 12/11/2008  . ALOPECIA 12/11/2008  . Shortness of breath   . Pulmonary embolism     bilateral  . Orthopnea   . Measles     as child  . History of chicken pox     as child  . Cancer     left breast  . Sleep apnea     can not wear CPAP  . Headache(784.0)   . Complication of anesthesia     "woke up after surgery and could not breath"    MEDICATIONS:  Prior to Admission medications   Medication Sig Start Date End Date Taking? Authorizing Provider   acetaminophen (TYLENOL) 500 MG tablet Take 2 tablets (1,000 mg total) by mouth every 6 (six) hours as needed for mild pain, fever or headache. 10/03/13   Barton Dubois, MD  albuterol (PROVENTIL HFA;VENTOLIN HFA) 108 (90 BASE) MCG/ACT inhaler Inhale 2 puffs into the lungs every 6 (six) hours as needed for wheezing or shortness of breath. 10/03/13   Barton Dubois, MD  amLODipine (NORVASC) 5 MG tablet Take 1 tablet (5 mg total) by mouth daily. 10/03/13   Barton Dubois, MD  aspirin 81 MG tablet Take 81 mg by mouth daily.    Historical Provider, MD  atenolol (TENORMIN) 25 MG tablet Take 1 tablet (25 mg total) by mouth daily. 10/03/13   Barton Dubois, MD  dextrose 5 % SOLN 50 mL with ceFEPIme 2 G SOLR 2 g Inject 2 g into the vein every 12 (twelve) hours. To be given twice a day every day for 4 more days 10/03/13   Barton Dubois, MD  enoxaparin (LOVENOX) 40 MG/0.4ML injection Inject 0.4 mLs (40 mg total) into the skin daily. For 3 weeks 09/25/13   Domenic Polite, MD  feeding supplement, ENSURE, (ENSURE) PUDG Take 1 Container by mouth daily. 10/03/13   Barton Dubois, MD  feeding supplement, GLUCERNA SHAKE, (GLUCERNA SHAKE) LIQD Take 237 mLs by mouth daily as needed (Please offer if pt eats <75% of breakfast). 10/03/13   Barton Dubois, MD  furosemide (LASIX) 40  MG tablet Take 1 tablet (40 mg total) by mouth 2 (two) times daily. 09/25/13   Domenic Polite, MD  HYDROcodone-acetaminophen (NORCO/VICODIN) 5-325 MG per tablet Take 1 tablet by mouth every 6 (six) hours as needed for moderate pain. 10/03/13   Barton Dubois, MD  insulin aspart (NOVOLOG) 100 UNIT/ML injection Inject 1-9 Units into the skin 3 (three) times daily before meals.    Historical Provider, MD  levothyroxine (SYNTHROID, LEVOTHROID) 125 MCG tablet Take 125 mcg by mouth daily before breakfast.    Historical Provider, MD  Multiple Vitamin (MULTIVITAMIN WITH MINERALS) TABS tablet Take 1 tablet by mouth daily. 10/03/13   Barton Dubois, MD  pantoprazole  (PROTONIX) 40 MG tablet Take 1 tablet (40 mg total) by mouth daily at 6 (six) AM. 10/03/13   Barton Dubois, MD  potassium chloride SA (K-DUR,KLOR-CON) 20 MEQ tablet Take 2 tablets (40 mEq total) by mouth daily. 10/03/13   Barton Dubois, MD  sodium chloride 0.9 % SOLN 100 mL with DAPTOmycin 500 MG SOLR 500 mg Inject 500 mg into the vein daily. Till 2/27 09/25/13   Domenic Polite, MD    ALLERGIES:  Allergies  Allergen Reactions  . Meloxicam     REACTION: itiching    SOCIAL HISTORY:  History  Substance Use Topics  . Smoking status: Never Smoker   . Smokeless tobacco: Never Used  . Alcohol Use: No    FAMILY HISTORY: Family History  Problem Relation Age of Onset  . Heart disease Father 52  . Cancer Maternal Aunt     breast    EXAM: BP 126/40  Pulse 63  Temp(Src) 98.1 F (36.7 C) (Oral)  Resp 22  SpO2 99% CONSTITUTIONAL: Alert and oriented and responds appropriately to questions. Morbidly obese, moderate respiratory distress HEAD: Normocephalic EYES: Conjunctivae clear, PERRL ENT: normal nose; no rhinorrhea; moist mucous membranes; pharynx without lesions noted NECK: Supple, no meningismus, no LAD  CARD: RRR; S1 and S2 appreciated; no murmurs, no clicks, no rubs, no gallops RESP: Normal chest excursion without splinting; breath sounds equal bilaterally but diminished in her bases, no wheezing or rhonchi or rales, patient is tachypneic and in moderate respiratory distress ABD/GI: Normal bowel sounds; non-distended; soft, non-tender, no rebound, no guarding BACK:  The back appears normal and is non-tender to palpation, there is no CVA tenderness EXT: Normal ROM in all joints; non-tender to palpation; no edema; normal capillary refill; no cyanosis    SKIN: Normal color for age and race; warm NEURO: Moves all extremities equally PSYCH: The patient's mood and manner are appropriate. Grooming and personal hygiene are appropriate.  MEDICAL DECISION MAKING: Patient here with shortness  of breath worse with exertion that has been chronic but worsening over the past 2 weeks. Her oxygen saturation on her normal 2-4 L is 94% at rest and she is tachypneic with increased work of breathing. Concern for possible pulmonary edema versus pneumonia versus pulmonary embolus. We'll obtain cardiac labs, chest x-ray. If creatinine is normal, we'll perform CT of patient's chest. Anticipate patient will need admission. We'll also obtain ABG.  Will give one duoneb.  ED PROGRESS: Patient's potassium levels greater than 7.7. No hemolysis seen. Will repeat this lab. EKG shows no peaked T waves or interval changes. We'll give insulin, D50 and second 500 mL IV fluid bolus. Patient's creatinine is slightly elevated at 2.13.  Her GFR is 22. Patient will not be able to receive a CT of her chest using IV contrast to evaluate for pulmonary embolus. Will  obtain VQ scan. Chest x-ray shows possible multifocal pneumonia versus edema. Her BNP is slightly elevated at 1700. Given she was recently hospitalized for healthcare acquired pneumonia and she is complaining of cough, will treat with IV antibiotics.   1:00 PM  Pt's ABG shows very mild respiratory acidosis but her PO2 is normal. Will continue breathing treatments.   1:43 PM  Pt's repeat potassium is greater than 7.7. She has received 1 duoneb, insulin and D50, 1 L of IV fluids. Will give Lasix and Kayexalate. VQ scan is pending.   2:43 PM  VQ scan shows no mismatched, probability of pulmonary embolus is very low. Will discuss with hospitalist for admission for hyperkalemia. PCP is Dr. Carolann Littler with [facility].    3:06 PM  Pt's repeat EKG shows no elevated T waves or interval changes were ischemic changes or arrhythmia. Discussed with Dr. Gilles Chiquito, hospitalist, for admission to step down. Patient reports feeling much better after intervention but is still mildly tachypneic but doing well with oxygen. Her blood pressure has also improved after IV  fluids.         CRITICAL CARE Performed by: Nyra Jabs   Total critical care time: 45 minutes  Critical care time was exclusive of separately billable procedures and treating other patients.  Critical care was necessary to treat or prevent imminent or life-threatening deterioration.  Critical care was time spent personally by me on the following activities: development of treatment plan with patient and/or surrogate as well as nursing, discussions with consultants, evaluation of patient's response to treatment, examination of patient, obtaining history from patient or surrogate, ordering and performing treatments and interventions, ordering and review of laboratory studies, ordering and review of radiographic studies, pulse oximetry and re-evaluation of patient's condition.    EKG Interpretation    Date/Time:  November 03, 2013 11:26:27 EST Ventricular Rate:  62 PR Interval:  173 QRS Duration: 92 QT Interval:  422 QTC Calculation: 428 R Axis:   26 Text Interpretation:  Sinus arrhythmia Low voltage, precordial leads No significant change since last tracing Confirmed by WARD  DO, KRISTEN ([id number]) on 11/03/13 12:24:56 PM            EKG Interpretation    Date/Time:  11-03-13 14:57:34 EST Ventricular Rate:  64 PR Interval:  171 QRS Duration: 92 QT Interval:  417 QTC Calculation: 430 R Axis:   26 Text Interpretation:  Sinus rhythm No significant change since last tracing Confirmed by WARD  DO, KRISTEN ([id number]) on 03-Nov-2013 3:01:06 PM              [place], DO 11/03/2013 9169

## 2013-10-12 NOTE — ED Notes (Signed)
Contacted radiology about chest xray result time,.

## 2013-10-12 NOTE — ED Notes (Signed)
Lab called with critical value of potassium greater than 7.7; will notify physician

## 2013-10-12 NOTE — ED Notes (Signed)
Contacted IV team about accessing PICC line.

## 2013-10-12 NOTE — ED Notes (Signed)
Pt reports increased SOB. Pt 92% on 6L [place]. Placed on mask, 98%.

## 2013-10-12 NOTE — Progress Notes (Signed)
ANTIBIOTIC CONSULT NOTE - INITIAL  Pharmacy Consult for vancomycin and cefepime Indication: pneumonia  Allergies  Allergen Reactions  . Meloxicam     REACTION: itiching    Patient Measurements:   Weight: 128.4 kg on 10/03/13  Vital Signs: Temp: 98.1 F (36.7 C) (02/06 1024) Temp src: Oral (02/06 1024) BP: 118/52 mmHg (02/06 1230) Pulse Rate: 59 (02/06 1245) Intake/Output from previous day:   Intake/Output from this shift: Total I/O In: 500 [I.V.:500] Out: -   Labs:  Recent Labs  10/11/2013 1140  WBC 8.6  HGB 8.2*  PLT 231  CREATININE 2.13*   The CrCl is unknown because both a height and weight (above a minimum accepted value) are required for this calculation. No results found for this basename: VANCOTROUGH, VANCOPEAK, VANCORANDOM, GENTTROUGH, GENTPEAK, GENTRANDOM, TOBRATROUGH, TOBRAPEAK, TOBRARND, AMIKACINPEAK, AMIKACINTROU, AMIKACIN,  in the last 72 hours   Microbiology: Recent Results (from the past 720 hour(s))  ANAEROBIC CULTURE     Status: None   Collection Time    09/13/13  8:00 PM      Result Value Range Status   Specimen Description KNEE RIGHT KNEE JOINT   Final   Special Requests [facility] ONLY   Final   Gram Stain     Final   Value: ABUNDANT WBC PRESENT,BOTH PMN AND MONONUCLEAR     NO SQUAMOUS EPITHELIAL CELLS SEEN     MODERATE GRAM POSITIVE COCCI     IN PAIRS IN CLUSTERS     Performed at [facility]   Culture     Final   Value: NO ANAEROBES ISOLATED     Performed at [facility]   Report Status 09/19/2013 FINAL   Final  BODY FLUID CULTURE     Status: None   Collection Time    09/14/13  4:27 PM      Result Value Range Status   Specimen Description KNEE   Final   Special Requests NONE   Final   Gram Stain     Final   Value: ABUNDANT WBC PRESENT,BOTH PMN AND MONONUCLEAR     MODERATE GRAM POSITIVE COCCI     IN PAIRS IN CLUSTERS Gram Stain Report Called to,Read Back By and Verified With: Gram Stain Report Called to,Read Back By and  Verified With: DONNA A @1131  ON 09/15/13 BY SMIAS     Performed at [facility]   Culture     Final   Value: ABUNDANT METHICILLIN RESISTANT STAPHYLOCOCCUS AUREUS     Note: RIFAMPIN AND GENTAMICIN SHOULD NOT BE USED AS SINGLE DRUGS FOR TREATMENT OF STAPH INFECTIONS. CRITICAL RESULT CALLED TO, READ BACK BY AND VERIFIED WITH: CAITLYN TAYLOR 09/16/13 @ 9:12AM BY RUSCOE A.     Performed at [facility]   Report Status 09/16/2013 FINAL   Final   Organism ID, Bacteria METHICILLIN RESISTANT STAPHYLOCOCCUS AUREUS   Final  SURGICAL PCR SCREEN     Status: Abnormal   Collection Time    09/16/13  4:47 PM      Result Value Range Status   MRSA, PCR POSITIVE (*) NEGATIVE Final   Comment: RESULT CALLED TO, READ BACK BY AND VERIFIED WITH:     K.BUTCHEE AT 1818 ON [id number] BY C.BONGEL   Staphylococcus aureus POSITIVE (*) NEGATIVE Final   Comment:            The Xpert SA Assay (FDA     approved for NASAL specimens     in patients over 74 years of age),  is one component of     a comprehensive surveillance     program.  Test performance has     been validated by [facility] for patients greater     than or equal to 42 year old.     It is not intended     to diagnose infection nor to     guide or monitor treatment.  MRSA PCR SCREENING     Status: Abnormal   Collection Time    09/17/13  7:33 AM      Result Value Range Status   MRSA by PCR POSITIVE (*) NEGATIVE Final   Comment:            The GeneXpert MRSA Assay (FDA     approved for NASAL specimens     only), is one component of a     comprehensive MRSA colonization     surveillance program. It is not     intended to diagnose MRSA     infection nor to guide or     monitor treatment for     MRSA infections.     RESULT CALLED TO, READ BACK BY AND VERIFIED WITH:     E. MACABUAG RN AT 0940 ON 1.12.15 BY SHUEA  URINE CULTURE     Status: None   Collection Time    09/22/13  3:53 PM      Result Value Range Status   Specimen  Description URINE, CLEAN CATCH   Final   Special Requests NONE   Final   Culture  Setup Time     Final   Value: 09/22/2013 22:44     Performed at [place]     Final   Value: >=100,000 COLONIES/ML     Performed at [facility]   Culture     Final   Value: YEAST     Performed at [facility]   Report Status 09/23/2013 FINAL   Final  MRSA PCR SCREENING     Status: None   Collection Time    09/30/13  9:13 AM      Result Value Range Status   MRSA by PCR NEGATIVE  NEGATIVE Final   Comment:            The GeneXpert MRSA Assay (FDA     approved for NASAL specimens     only), is one component of a     comprehensive MRSA colonization     surveillance program. It is not     intended to diagnose MRSA     infection nor to guide or     monitor treatment for     MRSA infections.    Medical History: Past Medical History  Diagnosis Date  . HYPOTHYROIDISM 12/11/2008  . 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 HYPERTENSION 12/11/2008  . OSTEOARTHRITIS 12/11/2008  . ALOPECIA 12/11/2008  . Shortness of breath   . Pulmonary embolism     bilateral  . Orthopnea   . Measles     as child  . History of chicken pox     as child  . Cancer     left breast  . Sleep apnea     can not wear CPAP  . Headache(784.0)   . Complication of anesthesia     "woke up after surgery and could not breath"    Assessment: Pharmacy consulted to start vancomycin and cefepime for PNA in this 73 yo F.  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  She was on daptomycin 500 mg IV daily at her SNF and previously had renal toxicity attributed to vancomycin therapy.  Her daptomyicn therapy was due to end 11/02/13.  She recently had a 7 day course of cefepime for HCAP.  She is f/u by the ID clinic.   2/6 CXR: similar appearance of B lung opacities which may reflect multifocal PNA or edema. Persistent small R pleural effusion.  Wt 128 kg, creat 2.13, AF.    Goal of Therapy:  Vancomycin trough level 15-20 mcg/ml  Plan:  1. Cefepime 2 gm now, then cefepime 2 gm IV q24 2. Vancomycin 2 gm now, then vancomycin 1500 mg IV q24 3. F/u renal fxn, temp, wbc, culture data 4. F/u ID consult Eudelia Bunch, Pharm.D. [phone] Nov 07, 2013 1:04 PM

## 2013-10-12 NOTE — H&P (Signed)
[facility] History and Physical  Amy Burns:[id number] DOB: Jun 10, 1941 DOA: 10/29/2013  Referring physician: ED physician PCP: Kristian Covey, MD   Chief Complaint: Worsening SOB  HPI:  Ms. Forner is a 73yo woman with PMH of obesity, hypothyroidism, bilateral PE, h/o Left breast cancer, OSA who presented for SOB that has been chronic for "many years" but worse over the past 2 weeks.  She notes that her breathing is worse with exertion, better at rest.  She denies any LE swelling or chest pain, recent illness, cough, sputum productions, vomiting, nausea.  She does not have a history of asthma or COPD per patient or from chart review.  She also reports that it hurts more to breathe.  In the time she has been ill, she has been urinating normally and eating and drinking normally per report.  She has recently had a Right knee replacement that acutely became infected and was also recently treated for HCAP, and it is noted in the most recent ID note that she is scheduled to remain on Daptomycin through 2/27.  She notes that she lives with her husband, but I believe she came in from a facility.   CXR looked similar to previous given her h/o HCAP.  In the ED she was given a dose of Vancomycin and Cefepime.  She was also noted to be in acute renal failure on chronic, however, she notes no urinary symptoms.  On labs, she was found to have a severely elevated K.  She received the normal therapy in the ED including insulin, D50, kayexalate and albuterol, repeat was still elevated.  I have asked for a repeat K to be drawn.    She does have a history of PE noted in her PMH section of the chart, however, this is not one of the problems on her Problem list.  Going back 3 years of PCP notes, there is no mention of PE and her h/o Cancer is a distant history of breast Ca. Last Oncology visit was from 2012, she completed a course of arimidex.  I am not sure how distant in the past this PE diagnosis is from.    Due to patient being somnolent, some of the history was obtained from chart review and discussion with ED physician.   Assessment and Plan: Principal Problem:  Hyperkalemia: - I do not have a clear picture as to why her K is elevated to this extent.  She does have some mild AKI in the setting of CKD and was on K supplementation (this has been stopped).  Theoretically, if she did have a decreased PO intake and continued to take her supplementation, she could have gotten overloaded with K.  - Admit to SDU for frequent blood draws and tachypnea - repeat K is in process, if still elevated would repeat insulin, D50, give albuterol and continue fluids.  Further management once in the SDU - Check BMET once on floor for K and further management from that standpoint.  AM EKG, EKG for any changes, Telemetry.   Acute worsening of chronic DOE:  - Her CXR does show persistent infiltrates, however, she was on Abx of daptomycin in the outpatient setting - Possibly worsening CHF, she did received lasix in the ED and starting feeling better.   - She had mild hypercapnia on ABG, but she has no history of COPD - She is on a NRB now with good O2 saturation - If she continues to worsen on fluids, would stop them and  give lasix - She had a TTE in 09/20/13 with moderate concentric hypertrophy (? Diastolic dysfunction) and normal systolic function - I/O's - She is on Cefepime, vancomycin stopped due to renal dysfunction last time she was on it.  - She is on daptomycin for completion of therapy for a joint infection.  - VQ scan negative for clot.   Active Problems: Acute renal failure (ARF)  - Cr is up to 2.13, last check at 1.75 and previously at 1.3 range - Fluids as noted above for hyperkalemia - Monitor BMP closely, follow up labs this evening.  - UA/UC ordered.   HCAP (healthcare-associated pneumonia) - She should have completed therapy for this earlier in the month, she was due to complete this week  with cefepime and it does not appear she is on this anymore - Cefepime started in the ED - CXR is concerning, but unclear if this is new - Zosyn may be a better choice if she does spike a fever - Will send for cultures, blood, sputum.   HYPOTHYROIDISM - Continue home synthroid  HYPERTENSION - BP stable and low, have continued home meds with hold parameters.   Code Status: Full Family Communication: Pt at bedside Disposition Plan: Pending further workup.  Admit to inpatient, SDU.    Review of Systems:  Constitutional: Negative for fever, chills and malaise/fatigue. Negative for diaphoresis.  HENT: Negative for hearing loss, ear pain Eyes: Negative for blurred vision, double vision, photophobia, pain, discharge and redness.  Respiratory: Negative for cough, hemoptysis, sputum production, wheezing and stridor.   Positive for shortness of breath Cardiovascular: Negative for chest pain, palpitations, leg swelling.  Gastrointestinal: Negative for nausea, vomiting and abdominal pain. Negative for heartburn, constipation, blood in stool and melena.  Genitourinary: Negative for dysuria, urgency, frequency.  Skin: Negative for itching and rash.  Neurological: Negative for dizziness and weakness Endo/Heme/Allergies: Negative  polydipsia.     Past Medical History  Diagnosis Date  . HYPOTHYROIDISM 12/11/2008  . 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 HYPERTENSION 12/11/2008  . OSTEOARTHRITIS 12/11/2008  . ALOPECIA 12/11/2008  . Shortness of breath   . Pulmonary embolism     bilateral  . Orthopnea   . Measles     as child  . History of chicken pox     as child  . Cancer     left breast  . Sleep apnea     can not wear CPAP  . Headache(784.0)   . Complication of anesthesia     "woke up after surgery and could not breath"    Past Surgical History  Procedure Laterality Date  . Thyroidectomy, partial  2002  . Breast surgery  2007    cancer  . Knee surgery  2009    left  . Total knee arthroplasty Right  01/30/2013    Procedure: RIGHT TOTAL KNEE ARTHROPLASTY;  Surgeon: Magnus Sinning, MD;  Location: [facility];  Service: Orthopedics;  Laterality: Right;  . Excisional total knee arthroplasty with antibiotic spacers Right 09/21/2013    Procedure: RIGHT TOTAL KNEE RESECTION WITH ANTIBIOTIC SPACER;  Surgeon: Mauri Pole, MD;  Location: [facility];  Service: Orthopedics;  Laterality: Right;    Social History:  reports that she has never smoked. She has never used smokeless tobacco. She reports that she does not drink alcohol or use illicit drugs.  Allergies  Allergen Reactions  . Meloxicam     REACTION: itiching    Family History  Problem Relation Age of Onset  . Heart  disease Father 40  . Cancer Maternal Aunt     breast    Prior to Admission medications   Medication Sig Start Date End Date Taking? Authorizing Provider  acetaminophen (TYLENOL) 500 MG tablet Take 2 tablets (1,000 mg total) by mouth every 6 (six) hours as needed for mild pain, fever or headache. 10/03/13  Yes Barton Dubois, MD  albuterol (PROVENTIL HFA;VENTOLIN HFA) 108 (90 BASE) MCG/ACT inhaler Inhale 2 puffs into the lungs every 6 (six) hours as needed for wheezing or shortness of breath. 10/03/13  Yes Barton Dubois, MD  amLODipine (NORVASC) 5 MG tablet Take 1 tablet (5 mg total) by mouth daily. 10/03/13  Yes Barton Dubois, MD  aspirin 81 MG tablet Take 81 mg by mouth daily.   Yes Historical Provider, MD  atenolol (TENORMIN) 25 MG tablet Take 1 tablet (25 mg total) by mouth daily. 10/03/13  Yes Barton Dubois, MD  enoxaparin (LOVENOX) 40 MG/0.4ML injection Inject 0.4 mLs (40 mg total) into the skin daily. For 3 weeks 09/25/13  Yes Domenic Polite, MD  feeding supplement, ENSURE, (ENSURE) PUDG Take 1 Container by mouth daily. 10/03/13  Yes Barton Dubois, MD  feeding supplement, GLUCERNA SHAKE, (GLUCERNA SHAKE) LIQD Take 237 mLs by mouth daily as needed (Please offer if pt eats <75% of breakfast). 10/03/13  Yes Barton Dubois, MD   furosemide (LASIX) 40 MG tablet Take 1 tablet (40 mg total) by mouth 2 (two) times daily. 09/25/13  Yes Domenic Polite, MD  HYDROcodone-acetaminophen (NORCO/VICODIN) 5-325 MG per tablet Take 1 tablet by mouth every 6 (six) hours as needed for moderate pain. 10/03/13  Yes Barton Dubois, MD  insulin aspart (NOVOLOG) 100 UNIT/ML injection Inject 1-9 Units into the skin 3 (three) times daily before meals.   Yes Historical Provider, MD  levothyroxine (SYNTHROID, LEVOTHROID) 125 MCG tablet Take 125 mcg by mouth daily before breakfast.   Yes Historical Provider, MD  LORazepam (ATIVAN) 0.5 MG tablet Take 0.5 mg by mouth every 8 (eight) hours as needed for anxiety.   Yes Historical Provider, MD  Multiple Vitamin (MULTIVITAMIN WITH MINERALS) TABS tablet Take 1 tablet by mouth daily. 10/03/13  Yes Barton Dubois, MD  pantoprazole (PROTONIX) 40 MG tablet Take 1 tablet (40 mg total) by mouth daily at 6 (six) AM. 10/03/13  Yes Barton Dubois, MD  potassium chloride SA (K-DUR,KLOR-CON) 20 MEQ tablet Take 2 tablets (40 mEq total) by mouth daily. 10/03/13  Yes Barton Dubois, MD  sodium chloride 0.9 % SOLN 100 mL with DAPTOmycin 500 MG SOLR 500 mg Inject 500 mg into the vein daily. Till 2/27 09/25/13  Yes Domenic Polite, MD    Physical Exam: Filed Vitals:   10/19/2013 1345 10/27/2013 1400 10/30/2013 1415 10/15/2013 1445  BP:  146/61    Pulse: 60 62 62 63  Temp:      TempSrc:      Resp: 24 24 19 23   SpO2: 95% 100% 100% 100%    Physical Exam  Constitutional: Very sleepy on exam, but awoke to voice and answered orientation questions appropriately.   HENT: Normocephalic. External right and left ear normal. Oropharynx is clear and moist.  Eyes: Conjunctivae and EOM are normal. PERRLA, no scleral icterus.  Neck: Neck supple. No JVD. .  CVS: RR, NR, S1/S2 +, no murmurs  Pulmonary: Due to body habitus and patient sleepiness, only could evaluate from anterior position, clear and without wheezing.  Abdominal: Soft. BS +,  no  distension Musculoskeletal:  No edema and no tenderness.  Neuro: Somewhat drowsy. No cranial nerve deficit. Skin: Skin is warm and dry. No rash noted.    Labs on Admission:  Basic Metabolic Panel:  Recent Labs Lab 10/31/2013 1140 10/27/2013 1245  NA 139  --   K >7.7* >7.7*  CL 105  --   CO2 24  --   GLUCOSE 162*  --   BUN 69*  --   CREATININE 2.13*  --   CALCIUM 8.5  --    CBC:  Recent Labs Lab 10/30/2013 1140  WBC 8.6  NEUTROABS 7.2  HGB 8.2*  HCT 27.2*  MCV 100.0  PLT 231    Radiological Exams on Admission: Nm Pulmonary Perf And Vent  10/13/2013   CLINICAL DATA:  Shortness of Breath  EXAM: NUCLEAR MEDICINE VENTILATION - PERFUSION LUNG SCAN  Views: Anterior, posterior, left lateral, right lateral, RPO, LPO, RAO, LAO- ventilation perfusion  Radionuclide: Technetium 70m DTPA -ventilation; Technetium 24m macroaggregated albumin-perfusion  Dose:  40.0 mCi-ventilation; 6.0 mCi-perfusion  Route of administration: Inhalation -ventilation; intravenous -perfusion  COMPARISON:  October 12, 2013 chest radiograph ; CT angiogram chest September 30, 2013  FINDINGS: The radiotracer uptake on the ventilation study is homogeneous and symmetric bilaterally.  The radiotracer uptake on the perfusion study is homogeneous and symmetric bilaterally.  There are no appreciable ventilation/perfusion mismatches.  IMPRESSION: No ventilation or perfusion defects appreciable. Very low probability of pulmonary embolus.   Electronically Signed   By: Lowella Grip M.D.   On: 11/01/2013 14:14   Dg Chest Port 1 View  10/31/2013   CLINICAL DATA:  Shortness of breath, weakness, edema. Evaluate for infiltrate.  EXAM: PORTABLE CHEST - 1 VIEW  COMPARISON:  Chest CT 09/30/2013 and chest radiographs 09/30/2013  FINDINGS: Right PICC remains in place with tip overlying the lower SVC. The cardiac silhouette remains enlarged. Patchy opacities throughout both lungs appear similar to prior radiographs. There is likely a small  right pleural effusion as demonstrated on recent CT. No pneumothorax is identified. Surgical clips project in the lower right neck.  IMPRESSION: Similar appearance of bilateral lung opacities, which may reflect multifocal pneumonia or edema. Likely persistent small right pleural effusion.   Electronically Signed   By: Logan Bores   On: 10/26/2013 12:35    EKG: EKGs X 2 reviewed.  First showed an irreg rhythm, but without peaked TW.  The second was SR with out peaking of TW.    Gilles Chiquito, MD  [facility] Pager [phone]  If 7PM-7AM, please contact night-coverage [email or web address] Password [email or web address] 10/21/2013, 4:30 PM

## 2013-10-12 NOTE — ED Notes (Signed)
Pt placed on 4L [place], 02 100%

## 2013-10-12 NOTE — ED Notes (Signed)
Nurse to accompany to [place]

## 2013-10-13 LAB — BASIC METABOLIC PANEL
BUN: 69 mg/dL — ABNORMAL HIGH (ref 6–23)
BUN: 71 mg/dL — ABNORMAL HIGH (ref 6–23)
BUN: 70 mg/dL — ABNORMAL HIGH (ref 6–23)
CHLORIDE: 107 meq/L (ref 96–112)
CHLORIDE: 104 meq/L (ref 96–112)
CO2: 23 meq/L (ref 19–32)
CO2: 23 meq/L (ref 19–32)
CO2: 24 mEq/L (ref 19–32)
Calcium: 8.1 mg/dL — ABNORMAL LOW (ref 8.4–10.5)
Calcium: 8.2 mg/dL — ABNORMAL LOW (ref 8.4–10.5)
Calcium: 8 mg/dL — ABNORMAL LOW (ref 8.4–10.5)
Chloride: 107 mEq/L (ref 96–112)
Creatinine, Ser: 2.43 mg/dL — ABNORMAL HIGH (ref 0.50–1.10)
Creatinine, Ser: 2.54 mg/dL — ABNORMAL HIGH (ref 0.50–1.10)
Creatinine, Ser: 2.98 mg/dL — ABNORMAL HIGH (ref 0.50–1.10)
GFR calc Af Amer: 22 mL/min — ABNORMAL LOW (ref >90)
GFR calc Af Amer: 20 mL/min — ABNORMAL LOW (ref >90)
GFR calc Af Amer: 17 mL/min — ABNORMAL LOW (ref >90)
GFR calc non Af Amer: 18 mL/min — ABNORMAL LOW (ref >90)
GFR calc non Af Amer: 15 mL/min — ABNORMAL LOW (ref >90)
GFR, EST NON AFRICAN AMERICAN: 19 mL/min — AB (ref >90)
GLUCOSE: 160 mg/dL — AB (ref 70–99)
Glucose, Bld: 162 mg/dL — ABNORMAL HIGH (ref 70–99)
Glucose, Bld: 160 mg/dL — ABNORMAL HIGH (ref 70–99)
POTASSIUM: 5.2 meq/L (ref 3.7–5.3)
Potassium: 7.2 mEq/L (ref 3.7–5.3)
Potassium: 5.9 mEq/L — ABNORMAL HIGH (ref 3.7–5.3)
SODIUM: 140 meq/L (ref 137–147)
SODIUM: 142 meq/L (ref 137–147)
Sodium: 140 mEq/L (ref 137–147)

## 2013-10-13 LAB — HEMOGLOBIN A1C
Hgb A1c MFr Bld: 6.2 % — ABNORMAL HIGH (ref <5.7)
MEAN PLASMA GLUCOSE: 131 mg/dL — AB (ref <117)

## 2013-10-13 LAB — GLUCOSE, CAPILLARY
GLUCOSE-CAPILLARY: 140 mg/dL — AB (ref 70–99)
Glucose-Capillary: 136 mg/dL — ABNORMAL HIGH (ref 70–99)
Glucose-Capillary: 120 mg/dL — ABNORMAL HIGH (ref 70–99)
Glucose-Capillary: 145 mg/dL — ABNORMAL HIGH (ref 70–99)

## 2013-10-13 LAB — CK: CK TOTAL: 133 U/L (ref 7–177)

## 2013-10-13 LAB — URINE CULTURE
COLONY COUNT: NO GROWTH
CULTURE: NO GROWTH

## 2013-10-13 LAB — SEDIMENTATION RATE: Sed Rate: 110 mm/hr — ABNORMAL HIGH (ref 0–22)

## 2013-10-13 LAB — C-REACTIVE PROTEIN: CRP: 12.5 mg/dL — ABNORMAL HIGH (ref <0.60)

## 2013-10-13 MED ORDER — SODIUM POLYSTYRENE SULFONATE 15 GM/60ML PO SUSP
30.0000 g | ORAL | Status: AC
  Administered 2013-10-13: 30 g via ORAL
  Filled 2013-10-13: qty 120

## 2013-10-13 MED ORDER — ALTEPLASE 2 MG IJ SOLR
2.0000 mg | Freq: Once | INTRAMUSCULAR | Status: AC
  Administered 2013-10-13: 2 mg
  Filled 2013-10-13: qty 2

## 2013-10-13 MED ORDER — IPRATROPIUM-ALBUTEROL 0.5-2.5 (3) MG/3ML IN SOLN
3.0000 mL | Freq: Once | RESPIRATORY_TRACT | Status: AC
  Administered 2013-10-13: 3 mL via RESPIRATORY_TRACT
  Filled 2013-10-13: qty 3

## 2013-10-13 MED ORDER — DEXTROSE 50 % IV SOLN
1.0000 | Freq: Once | INTRAVENOUS | Status: AC
  Administered 2013-10-13: 50 mL via INTRAVENOUS
  Filled 2013-10-13: qty 50

## 2013-10-13 MED ORDER — FUROSEMIDE 10 MG/ML IJ SOLN
60.0000 mg | Freq: Two times a day (BID) | INTRAMUSCULAR | Status: DC
  Administered 2013-10-13 – 2013-10-14 (×3): 60 mg via INTRAVENOUS
  Filled 2013-10-13 (×6): qty 6

## 2013-10-13 MED ORDER — ALBUTEROL SULFATE (2.5 MG/3ML) 0.083% IN NEBU: 3.0000 mL | INHALATION_SOLUTION | RESPIRATORY_TRACT | Status: DC | PRN

## 2013-10-13 MED ORDER — INSULIN ASPART 100 UNIT/ML ~~LOC~~ SOLN
10.0000 [IU] | Freq: Once | SUBCUTANEOUS | Status: AC
  Administered 2013-10-13: 10 [IU] via INTRAVENOUS

## 2013-10-13 NOTE — Progress Notes (Addendum)
[facility] TEAM 1 - Stepdown/ICU TEAM Progress Note  Amy Burns QBH:[id number] DOB: [birth year] DOA: 11/03/2013 PCP: Eulas Post, MD  Admit HPI / Brief Narrative: 73yo woman with PMH of obesity, hypothyroidism, bilateral PE, h/o Left breast cancer, OSA who presented for SOB that had been chronic for "many years" but worse over the past 2 weeks. She denied any LE swelling or chest pain, recent illness, cough, sputum productions, vomiting, nausea. In the time she had been ill, she had been urinating normally and eating and drinking normally per report. She recently had a R knee replacement that acutely became infected and was also recently treated for HCAP, and it is noted in the most recent ID note that she is scheduled to remain on Daptomycin through 2/27.   CXR looked similar to previous given her h/o HCAP. In the ED she was given a dose of Vancomycin and Cefepime. She was also noted to be in acute on chronic renal failure. On labs, she was found to have a severely elevated K.   She does have a history of PE noted in her PMH section of the chart. Going back 3 years of PCP notes, there is no mention of PE and her h/o cancer is a distant history of breast Ca. Last Oncology visit was from 2012, she completed a course of arimidex. I am not sure how distant in the past this PE diagnosis is from.   HPI/Subjective: Pt is alert, and emotional.  She is tearful.  She states she feels bad "all over."  She can not focus enough to give a direct ROS>  Assessment/Plan:  Acute on chronic kidney failure crt 1.75 on day of d/c from hospital 10/03/2013 - CK (on daptomycin) normal - has been passing urine, but unclear of volume - place foley for strict Is/Os and to assure no intermittent BOO - follow renal fxn daily - will need to consider renal US though in that pt urinating do not feel is needed emergently - attempting to diurese   Hyperkalemia Due to above + K+ supplementation - improved w/ multiple  doses of kayexelate - recheck ordered at 2:30 but not yet obtained - reordered as stat - cont kayexelate as needed   Acute worsening of chronic DOE Recently tx for HCAP - improved w/ diuretic - normal systolic fxn via TTE Jan 0973 - VQ negative for PE - likely volume overload related to renal failure (wgt was 128 kg at recent d/c - currently 134) - sats stable at present - follow   ?HCAP No objective signs to suggest pneumonia at this time - d/c abx, except for dapto  DM2 A1c 6.2 - CBG currently reasonably controlled   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 Alvan Dame + ID -  on long course of daptomycin   Hypothyroidism TSH was 0.65 in January   HTN Not an active problem   Morbid obesity - Body mass index is 54.06 kg/(m^2).  Code Status: FULL Family Communication: spoke w/ pt and family at bedside  Disposition Plan: SDU  Consultants: none  Procedures: VQ - 2/6 - No ventilation or perfusion defects appreciable. Very low probability of pulmonary embolus.   Antibiotics: Daptomycin >> Maxipime 2/6 >>2/7 Vanc 2/6 >>2/7  DVT prophylaxis: SQ heparin   Objective: Blood pressure 116/43, pulse 66, temperature 98.7 F (37.1 C), temperature source Axillary, resp. rate 16, height 5\' 2"  (1.575 m), weight  134.1 kg (295 lb 10.2 oz), SpO2 98.00%.  Intake/Output Summary (Last 24 hours) at 10/13/13 1831 Last data filed at 10/13/13 1829  Gross per 24 hour  Intake    990 ml  Output      2 ml  Net    988 ml   Exam: General: No acute respiratory distress at rest - very emotional  Lungs: very distant bs th/o - no apparent wheeze  Cardiovascular: very distant HS th/o - no appreciable M, G, R Abdomen: morbidly obese - nontender, nondistended, soft, bowel sounds positive, no rebound, no ascites, no appreciable mass Extremities: No significant cyanosis, clubbing;  1+ edema bilateral lower extremities  Data  Reviewed: Basic Metabolic Panel:  Recent Labs Lab 10/15/2013 1140 10/08/2013 1245 11/03/2013 1505 10/30/2013 1910 10/13/2013 2359 10/13/13 0636  NA 139  --   --  139 140 142  K >7.7* >7.7* 7.5* 6.4* 7.2* 5.9*  CL 105  --   --  107 107 107  CO2 24  --   --  21 23 23   GLUCOSE 162*  --   --  136* 162* 160*  BUN 69*  --   --  68* 69* 71*  CREATININE 2.13*  --   --  2.10* 2.43* 2.54*  CALCIUM 8.5  --   --  7.7* 8.1* 8.2*   Liver Function Tests: No results found for this basename: AST, ALT, ALKPHOS, BILITOT, PROT, ALBUMIN,  in the last 168 hours No results found for this basename: LIPASE, AMYLASE,  in the last 168 hours No results found for this basename: AMMONIA,  in the last 168 hours  CBC:  Recent Labs Lab 10/15/2013 1140  WBC 8.6  NEUTROABS 7.2  HGB 8.2*  HCT 27.2*  MCV 100.0  PLT 231   Cardiac Enzymes:  Recent Labs Lab 10/13/13 0636  CKTOTAL 133   BNP (last 3 results)  Recent Labs  09/30/13 0038 09/30/13 1030 10/26/2013 1140  PROBNP 2013.0* 2097.0* 1730.0*   CBG:  Recent Labs Lab 10/23/2013 2231 10/13/13 0802 10/13/13 1228  GLUCAP 158* 136* 140*    Recent Results (from the past 240 hour(s))  MRSA PCR SCREENING     Status: None   Collection Time    10/31/2013  5:20 PM      Result Value Range Status   MRSA by PCR NEGATIVE  NEGATIVE Final   Comment:            The GeneXpert MRSA Assay (FDA     approved for NASAL specimens     only), is one component of a     comprehensive MRSA colonization     surveillance program. It is not     intended to diagnose MRSA     infection nor to guide or     monitor treatment for     MRSA infections.  URINE CULTURE     Status: None   Collection Time    10/31/2013  6:40 PM      Result Value Range Status   Specimen Description URINE, CLEAN CATCH   Final   Special Requests NONE   Final   Culture  Setup Time     Final   Value: 10/13/2013 21:04     Performed at [facility] Count     Final   Value: NO GROWTH      Performed at [facility]   Culture     Final   Value: NO GROWTH  Performed at [facility]   Report Status 10/13/2013 FINAL   Final     Studies:  Recent x-ray studies have been reviewed in detail by the Attending Physician  Scheduled Meds:  Scheduled Meds: . alteplase  2 mg Intracatheter Once  . amLODipine  5 mg Oral Daily  . aspirin  81 mg Oral Daily  . atenolol  25 mg Oral Daily  . DAPTOmycin (CUBICIN)  IV  500 mg Intravenous Q24H  . feeding supplement (ENSURE)  1 Container Oral Daily  . furosemide  40 mg Oral BID  . heparin  5,000 Units Subcutaneous Q8H  . insulin aspart  0-15 Units Subcutaneous TID WC  . insulin aspart  0-5 Units Subcutaneous QHS  . levothyroxine  125 mcg Oral QAC breakfast  . multivitamin with minerals  1 tablet Oral Daily  . pantoprazole  40 mg Oral Q0600    Time spent on care of this patient: 35 mins   Gu [place]  [phone] Pager - Text Page per Shea Evans as per below:  On-Call/Text Page:      [email or web address]      password [email or web address]  If 7PM-7AM, please contact night-coverage [email or web address] Password [facility] 10/13/2013, 6:31 PM   LOS: 1 day

## 2013-10-14 ENCOUNTER — Inpatient Hospital Stay (HOSPITAL_COMMUNITY): Payer: Medicare Other

## 2013-10-14 ENCOUNTER — Encounter (HOSPITAL_COMMUNITY): Payer: Self-pay

## 2013-10-14 DIAGNOSIS — I509 Heart failure, unspecified: Secondary | ICD-10-CM

## 2013-10-14 DIAGNOSIS — I5033 Acute on chronic diastolic (congestive) heart failure: Secondary | ICD-10-CM

## 2013-10-14 DIAGNOSIS — Z96659 Presence of unspecified artificial knee joint: Secondary | ICD-10-CM

## 2013-10-14 DIAGNOSIS — T8450XA Infection and inflammatory reaction due to unspecified internal joint prosthesis, initial encounter: Secondary | ICD-10-CM

## 2013-10-14 DIAGNOSIS — N179 Acute kidney failure, unspecified: Secondary | ICD-10-CM

## 2013-10-14 DIAGNOSIS — J189 Pneumonia, unspecified organism: Secondary | ICD-10-CM

## 2013-10-14 LAB — CBC
HEMATOCRIT: 24.5 % — AB (ref 36.0–46.0)
HEMOGLOBIN: 7.3 g/dL — AB (ref 12.0–15.0)
MCH: 30.2 pg (ref 26.0–34.0)
MCHC: 29.8 g/dL — ABNORMAL LOW (ref 30.0–36.0)
MCV: 101.2 fL — ABNORMAL HIGH (ref 78.0–100.0)
Platelets: 212 10*3/uL (ref 150–400)
RBC: 2.42 MIL/uL — ABNORMAL LOW (ref 3.87–5.11)
RDW: 18.2 % — ABNORMAL HIGH (ref 11.5–15.5)
WBC: 9.8 10*3/uL (ref 4.0–10.5)

## 2013-10-14 LAB — POCT I-STAT 3, ART BLOOD GAS (G3+)
ACID-BASE DEFICIT: 2 mmol/L (ref 0.0–2.0)
Acid-base deficit: 3 mmol/L — ABNORMAL HIGH (ref 0.0–2.0)
Bicarbonate: 24.9 mEq/L — ABNORMAL HIGH (ref 20.0–24.0)
Bicarbonate: 24.9 mEq/L — ABNORMAL HIGH (ref 20.0–24.0)
O2 SAT: 97 %
O2 Saturation: 98 %
PCO2 ART: 58.2 mmHg — AB (ref 35.0–45.0)
PO2 ART: 112 mmHg — AB (ref 80.0–100.0)
Patient temperature: 98.7
TCO2: 27 mmol/L (ref 0–100)
TCO2: 27 mmol/L (ref 0–100)
pCO2 arterial: 53.5 mmHg — ABNORMAL HIGH (ref 35.0–45.0)
pH, Arterial: 7.24 — ABNORMAL LOW (ref 7.350–7.450)
pH, Arterial: 7.274 — ABNORMAL LOW (ref 7.350–7.450)
pO2, Arterial: 109 mmHg — ABNORMAL HIGH (ref 80.0–100.0)

## 2013-10-14 LAB — GLUCOSE, CAPILLARY
GLUCOSE-CAPILLARY: 131 mg/dL — AB (ref 70–99)
GLUCOSE-CAPILLARY: 111 mg/dL — AB (ref 70–99)
GLUCOSE-CAPILLARY: 119 mg/dL — AB (ref 70–99)
GLUCOSE-CAPILLARY: 114 mg/dL — AB (ref 70–99)
Glucose-Capillary: 151 mg/dL — ABNORMAL HIGH (ref 70–99)

## 2013-10-14 LAB — BLOOD GAS, ARTERIAL
Acid-base deficit: 3.4 mmol/L — ABNORMAL HIGH (ref 0.0–2.0)
Bicarbonate: 23.7 mEq/L (ref 20.0–24.0)
DRAWN BY: 24686
O2 CONTENT: 10 L/min
O2 SAT: 99.5 %
PATIENT TEMPERATURE: 98.6
PH ART: 7.188 — AB (ref 7.350–7.450)
PO2 ART: 126 mmHg — AB (ref 80.0–100.0)
TCO2: 25.7 mmol/L (ref 0–100)
pCO2 arterial: 65 mmHg (ref 35.0–45.0)

## 2013-10-14 LAB — BASIC METABOLIC PANEL
BUN: 74 mg/dL — AB (ref 6–23)
CHLORIDE: 101 meq/L (ref 96–112)
CO2: 23 meq/L (ref 19–32)
Calcium: 8 mg/dL — ABNORMAL LOW (ref 8.4–10.5)
Creatinine, Ser: 3.22 mg/dL — ABNORMAL HIGH (ref 0.50–1.10)
GFR calc Af Amer: 15 mL/min — ABNORMAL LOW (ref >90)
GFR calc non Af Amer: 13 mL/min — ABNORMAL LOW (ref >90)
Glucose, Bld: 133 mg/dL — ABNORMAL HIGH (ref 70–99)
POTASSIUM: 5 meq/L (ref 3.7–5.3)
Sodium: 138 mEq/L (ref 137–147)

## 2013-10-14 LAB — PROTEIN / CREATININE RATIO, URINE
CREATININE, URINE: 147.43 mg/dL
PROTEIN CREATININE RATIO: 0.49 — AB (ref 0.00–0.15)
TOTAL PROTEIN, URINE: 72.4 mg/dL

## 2013-10-14 LAB — VANCOMYCIN, RANDOM: VANCOMYCIN RM: 16.1 ug/mL

## 2013-10-14 LAB — TOBRAMYCIN LEVEL, RANDOM

## 2013-10-14 MED ORDER — LEVOTHYROXINE SODIUM 100 MCG IV SOLR
60.0000 ug | Freq: Every day | INTRAVENOUS | Status: DC
  Filled 2013-10-14: qty 5

## 2013-10-14 MED ORDER — ACETAMINOPHEN 650 MG RE SUPP: 650.0000 mg | RECTAL | Status: DC | PRN

## 2013-10-14 MED ORDER — PANTOPRAZOLE SODIUM 40 MG IV SOLR
40.0000 mg | Freq: Every day | INTRAVENOUS | Status: DC
  Administered 2013-10-14: 40 mg via INTRAVENOUS
  Filled 2013-10-14 (×2): qty 40

## 2013-10-14 MED ORDER — LEVOTHYROXINE SODIUM 100 MCG IV SOLR
60.0000 ug | Freq: Every day | INTRAVENOUS | Status: DC
  Administered 2013-10-14 – 2013-10-15 (×2): 60 ug via INTRAVENOUS
  Filled 2013-10-14: qty 5

## 2013-10-14 MED ORDER — CHLORHEXIDINE GLUCONATE 0.12 % MT SOLN
15.0000 mL | Freq: Two times a day (BID) | OROMUCOSAL | Status: DC
  Administered 2013-10-14 – 2013-10-15 (×2): 15 mL via OROMUCOSAL
  Filled 2013-10-14: qty 15

## 2013-10-14 MED ORDER — SODIUM CHLORIDE 0.9 % IV SOLN
500.0000 mg | INTRAVENOUS | Status: DC
  Filled 2013-10-14: qty 10

## 2013-10-14 MED ORDER — BIOTENE DRY MOUTH MT LIQD
15.0000 mL | Freq: Two times a day (BID) | OROMUCOSAL | Status: DC
Start: 2013-10-15 — End: 2013-10-15

## 2013-10-14 MED ORDER — SODIUM CHLORIDE 0.9 % IV SOLN
INTRAVENOUS | Status: DC
  Administered 2013-10-14: 16:00:00 via INTRAVENOUS

## 2013-10-14 NOTE — Progress Notes (Signed)
ABG results called to Dr. Thereasa Solo. Orders received for BIPAP. RT notified and patient placed on BiPAP. Will rechack ABG in a few hours for resolution of hypercarbia.

## 2013-10-14 NOTE — Progress Notes (Signed)
ANTIBIOTIC CONSULT NOTE - INITIAL  Pharmacy Consult for daptomycin  Indication: joint infection  Allergies  Allergen Reactions  . Meloxicam     REACTION: itiching    Patient Measurements: Height: 5\' 2"  (157.5 cm) Weight: 296 lb 15.4 oz (134.7 kg) IBW/kg (Calculated) : 50.1 Adjusted Body Weight: 81kg  Vital Signs: Temp: 97.9 F (36.6 C) (02/08 1100) Temp src: Oral (02/08 1100) BP: 110/48 mmHg (02/08 1328) Pulse Rate: 105 (02/08 1328) Intake/Output from previous day: 02/07 0701 - 02/08 0700 In: 580 [P.O.:360; IV Piggyback:220] Out: 251 [Urine:250; Stool:1] Intake/Output from this shift: Total I/O In: 150 [P.O.:150] Out: 150 [Urine:150]  Labs:  Recent Labs  10/07/2013 1140  10/13/13 0636 10/13/13 1850 10/14/13 0500  WBC 8.6  --   --   --  9.8  HGB 8.2*  --   --   --  7.3*  PLT 231  --   --   --  212  CREATININE 2.13*  < > 2.54* 2.98* 3.22*  < > = values in this interval not displayed. Estimated Creatinine Clearance: 20.6 ml/min (by C-G formula based on Cr of 3.22). No results found for this basename: VANCOTROUGH, Corlis Leak, VANCORANDOM, GENTTROUGH, GENTPEAK, GENTRANDOM, TOBRATROUGH, TOBRAPEAK, TOBRARND, AMIKACINPEAK, AMIKACINTROU, AMIKACIN,  in the last 72 hours   Microbiology: Recent Results (from the past 720 hour(s))  BODY FLUID CULTURE     Status: None   Collection Time    09/14/13  4:27 PM      Result Value Range Status   Specimen Description KNEE   Final   Special Requests NONE   Final   Gram Stain     Final   Value: ABUNDANT WBC PRESENT,BOTH PMN AND MONONUCLEAR     MODERATE GRAM POSITIVE COCCI     IN PAIRS IN CLUSTERS Gram Stain Report Called to,Read Back By and Verified With: Gram Stain Report Called to,Read Back By and Verified With: DONNA A @1131  ON 09/15/13 BY SMIAS     Performed at [facility]   Culture     Final   Value: ABUNDANT METHICILLIN RESISTANT STAPHYLOCOCCUS AUREUS     Note: RIFAMPIN AND GENTAMICIN SHOULD NOT BE USED AS SINGLE  DRUGS FOR TREATMENT OF STAPH INFECTIONS. CRITICAL RESULT CALLED TO, READ BACK BY AND VERIFIED WITH: CAITLYN TAYLOR 09/16/13 @ 9:12AM BY RUSCOE A.     Performed at [facility]   Report Status 09/16/2013 FINAL   Final   Organism ID, Bacteria METHICILLIN RESISTANT STAPHYLOCOCCUS AUREUS   Final  SURGICAL PCR SCREEN     Status: Abnormal   Collection Time    09/16/13  4:47 PM      Result Value Range Status   MRSA, PCR POSITIVE (*) NEGATIVE Final   Comment: RESULT CALLED TO, READ BACK BY AND VERIFIED WITH:     K.BUTCHEE AT 1818 ON [id number] BY C.BONGEL   Staphylococcus aureus POSITIVE (*) NEGATIVE Final   Comment:            The Xpert SA Assay (FDA     approved for NASAL specimens     in patients over 45 years of age),     is one component of     a comprehensive surveillance     program.  Test performance has     been validated by [employer] for patients greater     than or equal to 64 year old.     It is not intended     to  diagnose infection nor to     guide or monitor treatment.  MRSA PCR SCREENING     Status: Abnormal   Collection Time    09/17/13  7:33 AM      Result Value Range Status   MRSA by PCR POSITIVE (*) NEGATIVE Final   Comment:            The GeneXpert MRSA Assay (FDA     approved for NASAL specimens     only), is one component of a     comprehensive MRSA colonization     surveillance program. It is not     intended to diagnose MRSA     infection nor to guide or     monitor treatment for     MRSA infections.     RESULT CALLED TO, READ BACK BY AND VERIFIED WITH:     E. MACABUAG RN AT 0940 ON 1.12.15 BY SHUEA  URINE CULTURE     Status: None   Collection Time    09/22/13  3:53 PM      Result Value Range Status   Specimen Description URINE, CLEAN CATCH   Final   Special Requests NONE   Final   Culture  Setup Time     Final   Value: 09/22/2013 22:44     Performed at [facility]     Final   Value: >=100,000 COLONIES/ML      Performed at [facility]   Culture     Final   Value: YEAST     Performed at [facility]   Report Status 09/23/2013 FINAL   Final  MRSA PCR SCREENING     Status: None   Collection Time    09/30/13  9:13 AM      Result Value Range Status   MRSA by PCR NEGATIVE  NEGATIVE Final   Comment:            The GeneXpert MRSA Assay (FDA     approved for NASAL specimens     only), is one component of a     comprehensive MRSA colonization     surveillance program. It is not     intended to diagnose MRSA     infection nor to guide or     monitor treatment for     MRSA infections.  MRSA PCR SCREENING     Status: None   Collection Time    10/20/2013  5:20 PM      Result Value Range Status   MRSA by PCR NEGATIVE  NEGATIVE Final   Comment:            The GeneXpert MRSA Assay (FDA     approved for NASAL specimens     only), is one component of a     comprehensive MRSA colonization     surveillance program. It is not     intended to diagnose MRSA     infection nor to guide or     monitor treatment for     MRSA infections.  URINE CULTURE     Status: None   Collection Time    10/14/2013  6:Amy PM      Result Value Range Status   Specimen Description URINE, CLEAN CATCH   Final   Special Requests NONE   Final   Culture  Setup Time     Final   Value: 10/15/2013 21:04     Performed at [facility]   Colony Count     Final   Value: NO GROWTH     Performed at [facility]   Culture     Final   Value: NO GROWTH     Performed at [facility]   Report Status 10/13/2013 FINAL   Final  CULTURE, BLOOD (ROUTINE X 2)     Status: None   Collection Time    10/14/2013  8:57 PM      Result Value Range Status   Specimen Description BLOOD LEFT HAND   Final   Special Requests BOTTLES DRAWN AEROBIC ONLY 2.5CC   Final   Culture  Setup Time     Final   Value: 10/13/2013 04:02     Performed at [facility]   Culture     Final   Value:        BLOOD CULTURE  RECEIVED NO GROWTH TO DATE CULTURE WILL BE HELD FOR 5 DAYS BEFORE ISSUING A FINAL NEGATIVE REPORT     Performed at [facility]   Report Status PENDING   Incomplete  CULTURE, BLOOD (ROUTINE X 2)     Status: None   Collection Time    11/03/2013  9:02 PM      Result Value Range Status   Specimen Description BLOOD RIGHT HAND   Final   Special Requests BOTTLES DRAWN AEROBIC ONLY 3CC   Final   Culture  Setup Time     Final   Value: 10/13/2013 04:03     Performed at [facility]   Culture     Final   Value:        BLOOD CULTURE RECEIVED NO GROWTH TO DATE CULTURE WILL BE HELD FOR 5 DAYS BEFORE ISSUING A FINAL NEGATIVE REPORT     Performed at [facility]   Report Status PENDING   Incomplete    Medical History: Past Medical History  Diagnosis Date  . HYPOTHYROIDISM 12/11/2008  . 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 HYPERTENSION 12/11/2008  . OSTEOARTHRITIS 12/11/2008  . ALOPECIA 12/11/2008  . Shortness of breath   . Pulmonary embolism     bilateral  . Orthopnea   . Measles     as child  . History of chicken pox     as child  . Cancer     left breast  . Sleep apnea     can not wear CPAP  . Headache(784.0)   . Complication of anesthesia     "woke up after surgery and could not breath"    Medications:  Anti-infectives   Start     Dose/Rate Route Frequency Ordered Stop   10/15/13 2000  DAPTOmycin (CUBICIN) 500 mg in sodium chloride 0.9 % IVPB     500 mg 220 mL/hr over 30 Minutes Intravenous Every 48 hours 10/14/13 1419 11/02/13 2359   10/13/13 1600  vancomycin (VANCOCIN) 1,500 mg in sodium chloride 0.9 % 500 mL IVPB  Status:  Discontinued     1,500 mg 250 mL/hr over 120 Minutes Intravenous Every 24 hours 10/24/2013 1302 10/19/2013 1642   10/13/13 1400  ceFEPIme (MAXIPIME) 2 g in dextrose 5 % 50 mL IVPB  Status:  Discontinued     2 g 100 mL/hr over 30 Minutes Intravenous Every 24 hours 10/28/2013 1300 10/13/13 1423   10/14/2013 2000  DAPTOmycin (CUBICIN) 500 mg in sodium chloride 0.9 %  IVPB  Status:  Discontinued     500 mg 220 mL/hr over 30 Minutes  Intravenous Every 24 hours 10/11/2013 1653 10/14/13 1419   10/15/2013 1400  vancomycin (VANCOCIN) 2,000 mg in sodium chloride 0.9 % 500 mL IVPB     2,000 mg 250 mL/hr over 120 Minutes Intravenous  Once 10/28/2013 1259 10/24/2013 1639   10/19/2013 1330  ceFEPIme (MAXIPIME) 2 g in dextrose 5 % 50 mL IVPB     2 g 100 mL/hr over 30 Minutes Intravenous  Once 10/22/2013 1255 10/14/2013 1453     Assessment: Amy Burns on daptomycin PTA for treatment of an infected joint. Per ID outpatient notes, she was to continue on this until 2/27. Scr today 3.22, CrCl 20. Vancomycin has been stopped d/t previous history of renal dysfunction and high troughs. Last CK checked was WNL.   Goal of Therapy:  Eradication of infection  Plan:  Change daptopmycin to 500mg  IV q48h given CrCl < 30 F/u renal fxn, C&S, clinical status and weekly CK  Courtlynn Holloman B. Leitha Schuller, PharmD Clinical Pharmacist - Resident Phone: [phone] Pager: [phone] 10/14/2013 2:22 PM

## 2013-10-14 NOTE — Progress Notes (Signed)
Afib 97-110.. BP stable. [place] notified and to let Dr. Nelda Marseille know. Will continue to monitor.

## 2013-10-14 NOTE — Consult Note (Addendum)
Amy Burns 10/14/2013 Rexene Agent Requesting Physician:  Katherene Ponto  Reason for Consult:  AKI HPI:  11F admitted 10/26/2013 with AKI, hyperkalemia tx medically, who has had progressive renal failure, oliguria, and hypercapneic RF req BiPAP.  6KG above admit weight.  Not responding to furosemide.  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  Had AKI during that time thought 2/2 Vanc and Gent (but OP note mentions tobramycin, not gent).  Was switched to daptomycin at that time.    Represented 1/25 with SOB and had CTA with 122mL contrast. Eventually felt to be HCAP.  Represented with SOB 2/6 as above.    Has had progressive AMS, hypercapnea with resp acidosis, now BipAP dependent.    PMH includes breast cancer, PE, OSA, obesity.  Before 09/2013 had normal renal function.     Creatinine, Ser (mg/dL)  Date Value  10/14/2013 3.22*  10/13/2013 2.98*  10/13/2013 2.54*  10/19/2013 2.43*  11/01/2013 2.10*  10/08/2013 2.13*  10/03/2013 1.75*  10/02/2013 1.35*  10/01/2013 1.29*  09/30/2013 1.34*  09/30/2013 1.35*  09/26/2013 1.59*  09/25/2013 1.70*  09/24/2013 2.01*  09/23/2013 2.36*  09/22/2013 2.55*  09/21/2013 1.37*  09/20/2013 1.05   09/19/2013 0.95   09/18/2013 0.83   ]  ] I/Os: I/O last 3 completed shifts: In: 3474 [P.O.:1080; I.V.:75; IV Piggyback:220] Out: 251 [Urine:250; Stool:1]  ROS NSAIDS: no exposure IV Contrast as above TMP/SMX no exposure Hypotension no exposure Balance of 12 systems is negative w/ exceptions as above  PMH  Past Medical History  Diagnosis Date  . HYPOTHYROIDISM 12/11/2008  . 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 HYPERTENSION 12/11/2008  . OSTEOARTHRITIS 12/11/2008  . ALOPECIA 12/11/2008  . Shortness of breath   . Pulmonary embolism     bilateral  . Orthopnea   . Measles     as child  . History of chicken pox     as child  . Cancer     left breast  . Sleep apnea     can not wear CPAP  . Headache(784.0)   . Complication of anesthesia    "woke up after surgery and could not breath"   PSH  Past Surgical History  Procedure Laterality Date  . Thyroidectomy, partial  2002  . Breast surgery  2007    cancer  . Knee surgery  2009    left  . Total knee arthroplasty Right 01/30/2013    Procedure: RIGHT TOTAL KNEE ARTHROPLASTY;  Surgeon: Magnus Sinning, MD;  Location: [facility];  Service: Orthopedics;  Laterality: Right;  . Excisional total knee arthroplasty with antibiotic spacers Right 09/21/2013    Procedure: RIGHT TOTAL KNEE RESECTION WITH ANTIBIOTIC SPACER;  Surgeon: Mauri Pole, MD;  Location: [facility];  Service: Orthopedics;  Laterality: Right;   FH  Family History  Problem Relation Age of Onset  . Heart disease Father 72  . Cancer Maternal Aunt     breast   SH  reports that she has never smoked. She has never used smokeless tobacco. She reports that she does not drink alcohol or use illicit drugs. Allergies  Allergies  Allergen Reactions  . Meloxicam     REACTION: itiching   Home medications Prior to Admission medications   Medication Sig Start Date End Date Taking? Authorizing Provider  acetaminophen (TYLENOL) 500 MG tablet Take 2 tablets (1,000 mg total) by mouth every 6 (six) hours as needed for mild pain, fever or headache. 10/03/13  Yes Vassie Loll, MD  albuterol (PROVENTIL HFA;VENTOLIN HFA) 108 (90 BASE) MCG/ACT inhaler Inhale 2 puffs into the lungs every 6 (six) hours as needed for wheezing or shortness of breath. 10/03/13  Yes Vassie Loll, MD  amLODipine (NORVASC) 5 MG tablet Take 1 tablet (5 mg total) by mouth daily. 10/03/13  Yes Vassie Loll, MD  aspirin 81 MG tablet Take 81 mg by mouth daily.   Yes Historical Provider, MD  atenolol (TENORMIN) 25 MG tablet Take 1 tablet (25 mg total) by mouth daily. 10/03/13  Yes Vassie Loll, MD  enoxaparin (LOVENOX) 40 MG/0.4ML injection Inject 0.4 mLs (40 mg total) into the skin daily. For 3 weeks 09/25/13  Yes Zannie Cove, MD  feeding supplement, ENSURE, (ENSURE)  PUDG Take 1 Container by mouth daily. 10/03/13  Yes Vassie Loll, MD  feeding supplement, GLUCERNA SHAKE, (GLUCERNA SHAKE) LIQD Take 237 mLs by mouth daily as needed (Please offer if pt eats <75% of breakfast). 10/03/13  Yes Vassie Loll, MD  furosemide (LASIX) 40 MG tablet Take 1 tablet (40 mg total) by mouth 2 (two) times daily. 09/25/13  Yes Zannie Cove, MD  HYDROcodone-acetaminophen (NORCO/VICODIN) 5-325 MG per tablet Take 1 tablet by mouth every 6 (six) hours as needed for moderate pain. 10/03/13  Yes Vassie Loll, MD  insulin aspart (NOVOLOG) 100 UNIT/ML injection Inject 1-9 Units into the skin 3 (three) times daily before meals.   Yes Historical Provider, MD  levothyroxine (SYNTHROID, LEVOTHROID) 125 MCG tablet Take 125 mcg by mouth daily before breakfast.   Yes Historical Provider, MD  LORazepam (ATIVAN) 0.5 MG tablet Take 0.5 mg by mouth every 8 (eight) hours as needed for anxiety.   Yes Historical Provider, MD  Multiple Vitamin (MULTIVITAMIN WITH MINERALS) TABS tablet Take 1 tablet by mouth daily. 10/03/13  Yes Vassie Loll, MD  pantoprazole (PROTONIX) 40 MG tablet Take 1 tablet (40 mg total) by mouth daily at 6 (six) AM. 10/03/13  Yes Vassie Loll, MD  potassium chloride SA (K-DUR,KLOR-CON) 20 MEQ tablet Take 2 tablets (40 mEq total) by mouth daily. 10/03/13  Yes Vassie Loll, MD  sodium chloride 0.9 % SOLN 100 mL with DAPTOmycin 500 MG SOLR 500 mg Inject 500 mg into the vein daily. Till 2/27 09/25/13  Yes Zannie Cove, MD    Current Medications Current Facility-Administered Medications  Medication Dose Route Frequency Provider Last Rate Last Dose  . 0.9 %  sodium chloride infusion   Intravenous Continuous Leslye Peer, MD      . acetaminophen (TYLENOL) suppository 650 mg  650 mg Rectal Q4H PRN Lonia Blood, MD      . albuterol (PROVENTIL) (2.5 MG/3ML) 0.083% nebulizer solution 3 mL  3 mL Inhalation Q4H PRN Lonia Blood, MD      . Melene Muller ON 10/15/2013] DAPTOmycin (CUBICIN)  500 mg in sodium chloride 0.9 % IVPB  500 mg Intravenous Q48H Alvin B Oung, RPH      . furosemide (LASIX) injection 60 mg  60 mg Intravenous BID Lonia Blood, MD   60 mg at 10/14/13 0800  . heparin injection 5,000 Units  5,000 Units Subcutaneous Q8H Inez Catalina, MD   5,000 Units at 10/14/13 1512  . insulin aspart (novoLOG) injection 0-15 Units  0-15 Units Subcutaneous TID WC Inez Catalina, MD   3 Units at 10/14/13 1235  . insulin aspart (novoLOG) injection 0-5 Units  0-5 Units Subcutaneous QHS Inez Catalina, MD      . Melene Muller ON 10/15/2013]  levothyroxine (SYNTHROID, LEVOTHROID) injection 60 mcg  60 mcg Intravenous Daily Cherene Altes, MD   60 mcg at 10/14/13 1512  . ondansetron (ZOFRAN) injection 4 mg  4 mg Intravenous Q6H PRN Sid Falcon, MD   4 mg at 10/14/2013 2018  . pantoprazole (PROTONIX) injection 40 mg  40 mg Intravenous QHS Cherene Altes, MD      . sodium chloride 0.9 % injection 10-40 mL  10-40 mL Intracatheter PRN Kristen N Ward, DO   40 mL at 10/13/13 2344    CBC  Recent Labs Lab 10/22/2013 1140 10/14/13 0500  WBC 8.6 9.8  NEUTROABS 7.2  --   HGB 8.2* 7.3*  HCT 27.2* 24.5*  MCV 100.0 101.2*  PLT 231 650   Basic Metabolic Panel  Recent Labs Lab 10/25/2013 1140 10/20/2013 1245 10/23/2013 1505 10/15/2013 1910 10/07/2013 2359 10/13/13 0636 10/13/13 1850 10/14/13 0500  NA 139  --   --  139 140 142 140 138  K >7.7* >7.7* 7.5* 6.4* 7.2* 5.9* 5.2 5.0  CL 105  --   --  107 107 107 104 101  CO2 24  --   --  21 23 23 24 23   GLUCOSE 162*  --   --  136* 162* 160* 160* 133*  BUN 69*  --   --  68* 69* 71* 70* 74*  CREATININE 2.13*  --   --  2.10* 2.43* 2.54* 2.98* 3.22*  CALCIUM 8.5  --   --  7.7* 8.1* 8.2* 8.0* 8.0*   CK 133.    Physical Exam  Blood pressure 112/60, pulse 118, temperature 97.9 F (36.6 C), temperature source Oral, resp. rate 20, height 5\' 2"  (1.575 m), weight 134.7 kg (296 lb 15.4 oz), SpO2 100.00%. GEN: obese femail on BiPAP, eyes open ENT: NCAT,  thick neck EYES: EOMI CV: RRR, distant HS PULM: coarse bs b/l ABD: obese, soft, nt/nd SKIN: no new rashes/lesions EXT:1+ Pitting LEE NEURO: grossly nonfocal   Assessment  1. AKI, oliguric.  ? Prolonged contrast nephropathy, ?aminoglycoside.  Likely ATN.  Doesn't appear pre-renal 2. Hypercapneic RF, Resp Acidosis 3. Hyperkalemia, resolved 4. Infected R TKR with vanc/tobra spacer, on daptomycin.  MRSA 5. AMS 2/2 #2, ?#1 6. DM2, A1c<7% 7. Morbid obesity  PLAN 1. Very well could progress to need RRT next 24-48h 2. Tobramycin Level, random 3. Vanc Level, random 4. UP/C 5. Renal US 6. No problem with escalating furosemide doses for volume management 7. Avoid NSAIDs, judicious IV contrast    Pearson Grippe MD 10/14/2013, 4:25 PM

## 2013-10-14 NOTE — Progress Notes (Addendum)
[facility] TEAM 1 - Stepdown/ICU TEAM Progress Note  Amy GITTO WER:[id number] DOB: 02-26-1941 DOA: 11/09/13  PCP: Eulas Post, MD  Admit HPI / Brief Narrative: 73yo woman with PMH of obesity, hypothyroidism, bilateral PE, h/o Left breast cancer, OSA who presented for SOB that had been chronic for "many years" but worse over the past 2 weeks. She denied any LE swelling or chest pain, recent illness, cough, sputum, vomiting, nausea. In the time she had been ill, she had been urinating normally and eating and drinking normally per report. She recently had a R knee replacement that acutely became infected and was also recently treated for HCAP, and it is noted in the most recent ID note that she is scheduled to remain on Daptomycin through 2/27.   CXR looked similar to previous given her h/o HCAP. In the ED she was given a dose of Vancomycin and Cefepime. She was also noted to be in acute on chronic renal failure. On labs, she was found to have a severely elevated K.   She does have a history of PE noted in her PMH section of the chart. Going back 3 years of PCP notes, there is no mention of PE and her h/o cancer is a distant history of breast Ca. Last Oncology visit was from 2012, she completed a course of arimidex. I am not sure how distant in the past this PE diagnosis is from.   HPI/Subjective: Pt is significantly more obtunded today.  Her respiratory effort is quite poor.  Her husband notes that she has been much less responsive all day.    Assessment/Plan:  Acute on chronic kidney failure crt 1.75 on day of d/c from hospital 10/03/2013 - CK (on daptomycin) normal - has been passing urine, but unclear of volume - placed foley for strict Is/Os 2/7 - limited UOP thus far - though crt not severely elevated, GFR is quite low, and I am concerned that uremia could be cause of obtundation - additional concern is for refractory volume overload/pulmonary edema w/ lack of response to diuretic -  consulted Nephrology for an evaluation  Obtundation - Acute hypercarbic resp failure resp effort quite impaired - ABG reveals hypercarbia w/ resp acidosis - start BIPAP, but given obtundation, I am concerned this will not be sufficient - PCCM consulted as I fear intubation is imminent   Hyperkalemia Due to above + K+ supplementation - improved w/ multiple doses of kayexelate - K+ improved to a safe level at this time    Acute worsening of chronic DOE Recently tx for HCAP - improved w/ diuretic - normal systolic fxn via TTE Jan 1950 - VQ negative for PE - likely volume overload related to renal failure (wgt was 128 kg at recent d/c - currently 134) - sats stable at present, though hypercarbia is the issue currently   ?HCAP No objective signs to suggest pneumonia at this time - stopped abx except for dapto  DM2 A1c 6.2 - CBG currently reasonably controlled   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 Alvan Dame + ID -  on long course of daptomycin   Hypothyroidism TSH was 0.65 in January   HTN Not an active problem   Morbid obesity - Body mass index is 54.3 kg/(m^2).  Code Status: FULL Family Communication: spoke w/ husband at bedside  Disposition Plan: SDU - probable transfer to ICU   Consultants: Nephrology PCCM  Procedures: VQ - 2/6 - No ventilation or perfusion defects appreciable - very low probability of pulmonary embolus.   Antibiotics: Daptomycin >> Maxipime 2/6 >>2/7 Vanc 2/6 >>2/7  DVT prophylaxis: SQ heparin   Objective: Blood pressure 128/62, pulse 68, temperature 97.9 F (36.6 C), temperature source Oral, resp. rate 10, height 5\' 2"  (1.575 m), weight 134.7 kg (296 lb 15.4 oz), SpO2 100.00%.  Intake/Output Summary (Last 24 hours) at 10/14/13 1157 Last data filed at 10/14/13 0421  Gross per 24 hour  Intake    580 ml  Output    251 ml  Net    329 ml   Exam: General: obtunded -  unable to awaken - obvious resp distress w/ shallow breathing   Lungs: very distant bs th/o w/ poor air movement - no wheeze  Cardiovascular: very distant HS th/o - no appreciable M, G, R Abdomen: morbidly obese - nontender, nondistended, soft, bowel sounds positive, no rebound, no ascites, no appreciable mass Extremities: No significant cyanosis, clubbing;  1+ edema bilateral lower extremities  Data Reviewed: Basic Metabolic Panel:  Recent Labs Lab 10/21/2013 1910 10/30/2013 2359 10/13/13 0636 10/13/13 1850 10/14/13 0500  NA 139 140 142 140 138  K 6.4* 7.2* 5.9* 5.2 5.0  CL 107 107 107 104 101  CO2 21 23 23 24 23   GLUCOSE 136* 162* 160* 160* 133*  BUN 68* 69* 71* 70* 74*  CREATININE 2.10* 2.43* 2.54* 2.98* 3.22*  CALCIUM 7.7* 8.1* 8.2* 8.0* 8.0*   Liver Function Tests: No results found for this basename: AST, ALT, ALKPHOS, BILITOT, PROT, ALBUMIN,  in the last 168 hours No results found for this basename: LIPASE, AMYLASE,  in the last 168 hours No results found for this basename: AMMONIA,  in the last 168 hours  CBC:  Recent Labs Lab 10/29/2013 1140 10/14/13 0500  WBC 8.6 9.8  NEUTROABS 7.2  --   HGB 8.2* 7.3*  HCT 27.2* 24.5*  MCV 100.0 101.2*  PLT 231 212   Cardiac Enzymes:  Recent Labs Lab 10/13/13 0636  CKTOTAL 133   BNP (last 3 results)  Recent Labs  09/30/13 0038 09/30/13 1030 11/03/2013 1140  PROBNP 2013.0* 2097.0* 1730.0*   CBG:  Recent Labs Lab 10/07/2013 2231 10/13/13 0802 10/13/13 1228 10/13/13 1807 10/13/13 2114  GLUCAP 158* 136* 140* 120* 145*    Recent Results (from the past 240 hour(s))  MRSA PCR SCREENING     Status: None   Collection Time    10/11/2013  5:20 PM      Result Value Range Status   MRSA by PCR NEGATIVE  NEGATIVE Final   Comment:            The GeneXpert MRSA Assay (FDA     approved for NASAL specimens     only), is one component of a     comprehensive MRSA colonization     surveillance program. It is not     intended  to diagnose MRSA     infection nor to guide or     monitor treatment for     MRSA infections.  URINE CULTURE     Status: None   Collection Time    10/29/2013  6:40 PM      Result Value Range Status   Specimen Description URINE, CLEAN CATCH   Final   Special Requests NONE   Final   Culture  Setup Time     Final   Value: 11/01/2013 21:04     Performed  at [facility]     Final   Value: NO GROWTH     Performed at [facility]   Culture     Final   Value: NO GROWTH     Performed at [facility]   Report Status 10/13/2013 FINAL   Final     Studies:  Recent x-ray studies have been reviewed in detail by the Attending Physician  Scheduled Meds:  Scheduled Meds: . aspirin  81 mg Oral Daily  . DAPTOmycin (CUBICIN)  IV  500 mg Intravenous Q24H  . feeding supplement (ENSURE)  1 Container Oral Daily  . furosemide  60 mg Intravenous BID  . heparin  5,000 Units Subcutaneous Q8H  . insulin aspart  0-15 Units Subcutaneous TID WC  . insulin aspart  0-5 Units Subcutaneous QHS  . levothyroxine  125 mcg Oral QAC breakfast  . multivitamin with minerals  1 tablet Oral Daily  . pantoprazole  40 mg Oral Q0600    Time spent on care of this patient: 35 mins   [place]  [phone] Pager - Text Page per Shea Evans as per below:  On-Call/Text Page:      [email or web address]      password [email or web address]  If 7PM-7AM, please contact night-coverage [email or web address] Password [email or web address] 10/14/2013, 11:57 AM   LOS: 2 days

## 2013-10-14 NOTE — Progress Notes (Signed)
ABG results given to doctor at this time. No changes to be made at this time.  Myrtie Neither, RRT, RCP

## 2013-10-14 NOTE — Consult Note (Signed)
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  On 2/8 developed worsening respiratory status and obtundation and PCCM consulted.   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  Found to have acute on chronic renal failure and hyperkalemia and admitted by [facility].  VQ scan was neg for PE.  She was treated with 1 dose vanc/cefepime, BDs and 1 dose lasix.  She was placed on bipap 2/7 but became increasingly lethargic and was tx to ICU 2/8.   PAST MEDICAL HISTORY :  Past Medical History  Diagnosis Date  . HYPOTHYROIDISM 12/11/2008  . 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 HYPERTENSION 12/11/2008  . OSTEOARTHRITIS 12/11/2008  . ALOPECIA 12/11/2008  . Shortness of breath   . Pulmonary embolism     bilateral  . Orthopnea   . Measles     as child  . History of chicken pox     as child  . Cancer     left breast  . Sleep apnea     can not wear CPAP  . Headache(784.0)   . Complication of anesthesia     "woke up after surgery and could not breath"   Past Surgical History  Procedure Laterality Date  . Thyroidectomy, partial  2002  . Breast surgery  2007    cancer  . Knee surgery  2009    left  . Total knee arthroplasty Right 01/30/2013    Procedure: RIGHT TOTAL KNEE ARTHROPLASTY;  Surgeon: Drucilla Schmidt, MD;  Location: [facility];  Service: Orthopedics;  Laterality: Right;  .  Excisional total knee arthroplasty with antibiotic spacers Right 09/21/2013    Procedure: RIGHT TOTAL KNEE RESECTION WITH ANTIBIOTIC SPACER;  Surgeon: Shelda Pal, MD;  Location: [facility];  Service: Orthopedics;  Laterality: Right;   Prior to Admission medications   Medication Sig Start Date End Date Taking? Authorizing Provider  acetaminophen (TYLENOL) 500 MG tablet Take 2 tablets (1,000 mg total) by mouth every 6 (six) hours as needed for mild pain, fever or headache. 10/03/13  Yes Vassie Loll, MD  albuterol (PROVENTIL HFA;VENTOLIN HFA) 108 (90 BASE) MCG/ACT inhaler Inhale 2 puffs into the lungs every 6 (six) hours as needed for wheezing or shortness of breath. 10/03/13  Yes Vassie Loll, MD  amLODipine (NORVASC) 5 MG tablet Take 1 tablet (5 mg total) by mouth daily. 10/03/13  Yes Vassie Loll, MD  aspirin 81 MG tablet Take 81 mg by mouth daily.   Yes Historical Provider, MD  atenolol (TENORMIN) 25 MG tablet Take 1 tablet (25 mg total) by mouth daily. 10/03/13  Yes Vassie Loll, MD  enoxaparin (LOVENOX) 40 MG/0.4ML injection Inject 0.4 mLs (40 mg total) into the skin daily. For 3 weeks 09/25/13  Yes Zannie Cove, MD  feeding supplement, ENSURE, (ENSURE) PUDG Take 1 Container by mouth daily. 10/03/13  Yes Vassie Loll, MD  feeding supplement, GLUCERNA  SHAKE, (GLUCERNA SHAKE) LIQD Take 237 mLs by mouth daily as needed (Please offer if pt eats <75% of breakfast). 10/03/13  Yes Barton Dubois, MD  furosemide (LASIX) 40 MG tablet Take 1 tablet (40 mg total) by mouth 2 (two) times daily. 09/25/13  Yes Domenic Polite, MD  HYDROcodone-acetaminophen (NORCO/VICODIN) 5-325 MG per tablet Take 1 tablet by mouth every 6 (six) hours as needed for moderate pain. 10/03/13  Yes Barton Dubois, MD  insulin aspart (NOVOLOG) 100 UNIT/ML injection Inject 1-9 Units into the skin 3 (three) times daily before meals.   Yes Historical Provider, MD  levothyroxine (SYNTHROID, LEVOTHROID) 125 MCG tablet Take 125 mcg by mouth daily  before breakfast.   Yes Historical Provider, MD  LORazepam (ATIVAN) 0.5 MG tablet Take 0.5 mg by mouth every 8 (eight) hours as needed for anxiety.   Yes Historical Provider, MD  Multiple Vitamin (MULTIVITAMIN WITH MINERALS) TABS tablet Take 1 tablet by mouth daily. 10/03/13  Yes Barton Dubois, MD  pantoprazole (PROTONIX) 40 MG tablet Take 1 tablet (40 mg total) by mouth daily at 6 (six) AM. 10/03/13  Yes Barton Dubois, MD  potassium chloride SA (K-DUR,KLOR-CON) 20 MEQ tablet Take 2 tablets (40 mEq total) by mouth daily. 10/03/13  Yes Barton Dubois, MD  sodium chloride 0.9 % SOLN 100 mL with DAPTOmycin 500 MG SOLR 500 mg Inject 500 mg into the vein daily. Till 2/27 09/25/13  Yes Domenic Polite, MD   Allergies  Allergen Reactions  . Meloxicam     REACTION: itiching    FAMILY HISTORY:  Family History  Problem Relation Age of Onset  . Heart disease Father 54  . Cancer Maternal Aunt     breast   SOCIAL HISTORY:  reports that she has never smoked. She has never used smokeless tobacco. She reports that she does not drink alcohol or use illicit drugs.  REVIEW OF SYSTEMS:  AS per HPI - otherwise unable r/t lethargy.    VITAL SIGNS: Temp:  [97.8 F (36.6 C)-98.7 F (37.1 C)] 97.9 F (36.6 C) (02/08 1100) Pulse Rate:  [64-124] 105 (02/08 1328) Resp:  [9-16] 12 (02/08 1328) BP: (106-128)/(39-65) 110/48 mmHg (02/08 1328) SpO2:  [96 %-100 %] 99 % (02/08 1328) Weight:  [296 lb 15.4 oz (134.7 kg)] 296 lb 15.4 oz (134.7 kg) (02/08 0418) HEMODYNAMICS:   VENTILATOR SETTINGS:   INTAKE / OUTPUT: Intake/Output     02/07 0701 - 02/08 0700 02/08 0701 - 02/09 0700   P.O. 360 150   I.V. (mL/kg)     IV Piggyback 220    Total Intake(mL/kg) 580 (4.3) 150 (1.1)   Urine (mL/kg/hr) 250 (0.1) 175 (0.1)   Stool 1 (0)    Total Output 251 175   Net +329 -25          PHYSICAL EXAMINATION: General:  Chronically ill appearing obese female, NAD on bipap Neuro:  Mental status improved, awakens to voice,  answers some questions, follows commands  HEENT:  Mm dry, bipap Cardiovascular:  s1s2 irreg Lungs:  resps even non labored on bipap, diminished throughout  Abdomen:  Obese, soft, +bs Musculoskeletal:  Warm and dry, 1+ BLE edema    LABS:  CBC  Recent Labs Lab 10/23/2013 1140 10/14/13 0500  WBC 8.6 9.8  HGB 8.2* 7.3*  HCT 27.2* 24.5*  PLT 231 212   Coag's  Recent Labs Lab 11/01/2013 1140  APTT 33  INR 1.31   BMET  Recent Labs Lab 10/13/13 0636 10/13/13 1850 10/14/13 0500  NA 142 140 138  K 5.9* 5.2 5.0  CL 107 104 101  CO2 23 24 23   BUN 71* 70* 74*  CREATININE 2.54* 2.98* 3.22*  GLUCOSE 160* 160* 133*   Electrolytes  Recent Labs Lab 10/13/13 0636 10/13/13 1850 10/14/13 0500  CALCIUM 8.2* 8.0* 8.0*   Sepsis Markers No results found for this basename: LATICACIDVEN, PROCALCITON, O2SATVEN,  in the last 168 hours ABG  Recent Labs Lab 10/13/2013 1139 10/14/13 1255  PHART 7.322* 7.188*  PCO2ART 51.1* 65.0*  PO2ART 138.0* 126.0*   Liver Enzymes No results found for this basename: AST, ALT, ALKPHOS, BILITOT, ALBUMIN,  in the last 168 hours Cardiac Enzymes  Recent Labs Lab 10/30/2013 1140  PROBNP 1730.0*   Glucose  Recent Labs Lab 10/13/13 1228 10/13/13 1807 10/13/13 2114 10/14/13 0744 10/14/13 1211 10/14/13 1536  GLUCAP 140* 120* 145* 131* 151* 111*    Imaging Dg Chest Port 1 View  10/14/2013   CLINICAL DATA:  Shortness of breath.  EXAM: PORTABLE CHEST - 1 VIEW  COMPARISON:  10/23/2013.  FINDINGS: Stable enlarged cardiac silhouette. The right PICC tip remains in the proximal superior vena cava. No significant change in diffuse bilateral airspace opacity. Unremarkable bones.  IMPRESSION: Stable diffuse alveolar edema or pneumonia and cardiomegaly.   Electronically Signed   By: Enrique Sack M.D.   On: 10/14/2013 13:45      ASSESSMENT / PLAN:  PULMONARY Acute hypercarbic respiratory failure (recent tx for HCAP, chronic DOE) Pulmonary edema    OSA/OHS  Respiratory acidosis  P:   Cont bipap for now and f/u ABG - hopeful that with diuresis and short term bipap her hypercarbia and resp status will improve Almost certainly will need BiPAP or CPAP qhs at home F/u ABG  F/u CXR  Diuresis as renal function permits  BD PRN Resp status tenuous - remains high risk for intubation   CARDIOVASCULAR Likely volume overload - r/t renal fxn. Normal systolic fxn per echo in Jan P:  Diuresis as renal function permits  F/u CK (on dapto)  Cardiac enzymes   RENAL Acute on chronic renal failure  Hyperkalemia - improved with kayexalate  P:   F/u chem  Renal to see   GASTROINTESTINAL Obesity  P:   NPO for now with bipap  Cont PPI   HEMATOLOGIC Anemia no obvious source bleeding P:  FOB  F/u cbc  SQ heparin for DVT proph   INFECTIOUS MRSA septic R prosthetic knee  Recent HCAP - no evidence of this now P:   Continue daptomycin alone  ENDOCRINE DM   Hypothyroidism  P:  1 SSI F/u TSH  Continue synthroid   NEUROLOGIC AMS - multifactorial likely.  Hypercarbia +/- uremia in setting renal failure.  P:   Supportive care  Consider imaging if no improvement with rx of above     I have personally obtained a history, examined the patient, evaluated laboratory and imaging results, formulated the assessment and plan and placed orders.  CRITICAL CARE: The patient is critically ill with multiple organ systems failure and requires high complexity decision making for assessment and support, frequent evaluation and titration of therapies, application of advanced monitoring technologies and extensive interpretation of multiple databases. Critical Care Time devoted to patient care services described in this note is 60  minutes.    Darlina Sicilian, NP 10/14/2013  4:07 PM Pager: [phone] or [phone]  *Care during the described time interval was provided by me and/or other providers on the critical care  team. I have  reviewed this patient's available data, including medical history, events of note, physical examination and test results as part of my evaluation.  Baltazar Apo, MD, PhD 10/14/2013, 4:18 PM [facility] [phone] or if no answer [phone]

## 2013-10-15 DIAGNOSIS — E119 Type 2 diabetes mellitus without complications: Secondary | ICD-10-CM

## 2013-10-15 LAB — GLUCOSE, CAPILLARY
GLUCOSE-CAPILLARY: 114 mg/dL — AB (ref 70–99)
GLUCOSE-CAPILLARY: 123 mg/dL — AB (ref 70–99)
Glucose-Capillary: 126 mg/dL — ABNORMAL HIGH (ref 70–99)

## 2013-10-15 LAB — CBC
HCT: 23.9 % — ABNORMAL LOW (ref 36.0–46.0)
Hemoglobin: 7.2 g/dL — ABNORMAL LOW (ref 12.0–15.0)
MCH: 30.1 pg (ref 26.0–34.0)
MCHC: 30.1 g/dL (ref 30.0–36.0)
MCV: 100 fL (ref 78.0–100.0)
Platelets: 210 K/uL (ref 150–400)
RBC: 2.39 MIL/uL — ABNORMAL LOW (ref 3.87–5.11)
RDW: 17.8 % — ABNORMAL HIGH (ref 11.5–15.5)
WBC: 8.3 K/uL (ref 4.0–10.5)

## 2013-10-15 LAB — BASIC METABOLIC PANEL WITH GFR
BUN: 76 mg/dL — ABNORMAL HIGH (ref 6–23)
CO2: 23 meq/L (ref 19–32)
Calcium: 7.9 mg/dL — ABNORMAL LOW (ref 8.4–10.5)
Chloride: 103 meq/L (ref 96–112)
Creatinine, Ser: 3.48 mg/dL — ABNORMAL HIGH (ref 0.50–1.10)
GFR calc Af Amer: 14 mL/min — ABNORMAL LOW
GFR calc non Af Amer: 12 mL/min — ABNORMAL LOW
Glucose, Bld: 138 mg/dL — ABNORMAL HIGH (ref 70–99)
Potassium: 5.1 meq/L (ref 3.7–5.3)
Sodium: 139 meq/L (ref 137–147)

## 2013-10-15 LAB — PHOSPHORUS: Phosphorus: 6.3 mg/dL — ABNORMAL HIGH (ref 2.3–4.6)

## 2013-10-15 LAB — MAGNESIUM: MAGNESIUM: 2.3 mg/dL (ref 1.5–2.5)

## 2013-10-15 LAB — TSH: TSH: 0.207 u[IU]/mL — ABNORMAL LOW (ref 0.350–4.500)

## 2013-10-15 LAB — POCT I-STAT 3, ART BLOOD GAS (G3+)
Acid-base deficit: 3 mmol/L — ABNORMAL HIGH (ref 0.0–2.0)
Bicarbonate: 24.2 mEq/L — ABNORMAL HIGH (ref 20.0–24.0)
O2 Saturation: 96 %
PCO2 ART: 55.5 mmHg — AB (ref 35.0–45.0)
Patient temperature: 98.2
TCO2: 26 mmol/L (ref 0–100)
pH, Arterial: 7.246 — ABNORMAL LOW (ref 7.350–7.450)
pO2, Arterial: 97 mmHg (ref 80.0–100.0)

## 2013-10-15 MED ORDER — FUROSEMIDE 10 MG/ML IJ SOLN
80.0000 mg | Freq: Four times a day (QID) | INTRAMUSCULAR | Status: DC
  Administered 2013-10-15: 80 mg via INTRAVENOUS
  Filled 2013-10-15: qty 8

## 2013-10-15 MED ORDER — SODIUM CHLORIDE 0.9 % IV SOLN
10.0000 mg/h | INTRAVENOUS | Status: DC
  Administered 2013-10-15: 10 mg/h via INTRAVENOUS
  Filled 2013-10-15: qty 10

## 2013-10-15 MED ORDER — MORPHINE SULFATE 10 MG/ML IJ SOLN
10.0000 mg/h | INTRAVENOUS | Status: DC
  Administered 2013-10-15: 10 mg/h via INTRAVENOUS
  Filled 2013-10-15 (×3): qty 10

## 2013-10-15 MED ORDER — MORPHINE BOLUS VIA INFUSION
5.0000 mg | INTRAVENOUS | Status: DC | PRN
  Filled 2013-10-15: qty 20

## 2013-10-15 MED ORDER — DARBEPOETIN ALFA-POLYSORBATE 100 MCG/0.5ML IJ SOLN
100.0000 ug | INTRAMUSCULAR | Status: DC
  Filled 2013-10-15: qty 0.5

## 2013-10-15 NOTE — Progress Notes (Signed)
Chaplain responded to spiritual care consult, offered emotional and spiritual support to patient and her family. Patient is off life-prolonging treatment and transitioned to comfort care. Patient is awake and alert, grateful to have water and food, but "wants to go home." Family is emotional, but all are laughing and talking together. Chaplain provided caring presence, empathic listening, and emotional and spiritual support. Family appreciated chaplain support. Will follow up. Please page if needed.   Ethelene Browns [phone]

## 2013-10-15 NOTE — Progress Notes (Signed)
Subjective:  No significant events overnight- hemodynamically stable- UOp seems adequate and may be picking up- creatinine continues to rise slowly - she is begging- "i know you think Im crazy but I am not" Objective Vital signs in last 24 hours: Filed Vitals:   10/15/13 0500 10/15/13 0600 10/15/13 0700 10/15/13 0726  BP: 124/40 104/44 134/55 134/55  Pulse:  66 74 78  Temp:      TempSrc:      Resp: 23 12 19 23   Height:      Weight: 138.1 kg (304 lb 7.3 oz)     SpO2: 96% 99% 99% 100%   Weight change: 3.4 kg (7 lb 7.9 oz)  Intake/Output Summary (Last 24 hours) at 10/15/13 0347 Last data filed at 10/15/13 0700  Gross per 24 hour  Intake    410 ml  Output    690 ml  Net   -280 ml    Assessment/ Plan: Pt is a 73 y.o. yo female who was admitted on 11/01/2013 with  AKI in the setting of fairly recent TKR c/b infection and nephrotoxic abx- contrast  Assessment/Plan: 1. AKI- as above, likely multifactorial- renal ultrasound unremarkable.  No current indications for dialysis but creatinine continues to rise slowly.  Continue with supportive care- avoid nephrotoxins 2. Htn/volume- seems overloaded.  Only on lasix 60 BID- marginal UOP, will increase dose- probably difficult to know what BP really is secondary to body habitus  3. Anemia- severe- surg/hosp plus AKI- will add aranesp, no iron due to knee infection on abx- transfuse as needed  4. Hyperkalemia- stable- increased lasix should help     Maitlyn Penza A    Labs: Basic Metabolic Panel:  Recent Labs Lab 10/13/13 1850 10/14/13 0500 10/15/13 0507  NA 140 138 139  K 5.2 5.0 5.1  CL 104 101 103  CO2 24 23 23   GLUCOSE 160* 133* 138*  BUN 70* 74* 76*  CREATININE 2.98* 3.22* 3.48*  CALCIUM 8.0* 8.0* 7.9*  PHOS  --   --  6.3*   Liver Function Tests: No results found for this basename: AST, ALT, ALKPHOS, BILITOT, PROT, ALBUMIN,  in the last 168 hours No results found for this basename: LIPASE, AMYLASE,  in the last 168  hours No results found for this basename: AMMONIA,  in the last 168 hours CBC:  Recent Labs Lab 10/25/2013 1140 10/14/13 0500 10/15/13 0507  WBC 8.6 9.8 8.3  NEUTROABS 7.2  --   --   HGB 8.2* 7.3* 7.2*  HCT 27.2* 24.5* 23.9*  MCV 100.0 101.2* 100.0  PLT 231 212 210   Cardiac Enzymes:  Recent Labs Lab 10/13/13 0636  CKTOTAL 133   CBG:  Recent Labs Lab 10/14/13 1536 10/14/13 1934 10/14/13 2254 10/14/13 2354 10/15/13 0355  GLUCAP 111* 119* 114* 114* 126*    Iron Studies: No results found for this basename: IRON, TIBC, TRANSFERRIN, FERRITIN,  in the last 72 hours Studies/Results: US Renal  10/15/2013   CLINICAL DATA:  Thyroid for obstruction.  If acute renal injury.  EXAM: RENAL/URINARY TRACT ULTRASOUND COMPLETE  COMPARISON:  None.  FINDINGS: Right Kidney:  Length: 11.7 cm. Echogenicity within normal limits. No mass or hydronephrosis visualized.  Left Kidney:  Length: 11.6 cm. Echogenicity within normal limits. No mass or hydronephrosis visualized.  Bladder:  Bladder not visualized, likely decompressed with Foley catheter.  Study is technically suboptimal due to body habitus.  IMPRESSION: Technically suboptimal study.  No hydronephrosis.   Electronically Signed   By: Cay Schillings  Lamke M.D.   On: 10/15/2013 00:49   Dg Chest Port 1 View  10/14/2013   CLINICAL DATA:  Shortness of breath.  EXAM: PORTABLE CHEST - 1 VIEW  COMPARISON:  October 14, 2013.  FINDINGS: Stable enlarged cardiac silhouette. The right PICC tip remains in the proximal superior vena cava. No significant change in diffuse bilateral airspace opacity. Unremarkable bones.  IMPRESSION: Stable diffuse alveolar edema or pneumonia and cardiomegaly.   Electronically Signed   By: Enrique Sack M.D.   On: 10/14/2013 13:45   Medications: Infusions: . sodium chloride 20 mL/hr at 10/14/13 1615    Scheduled Medications: . antiseptic oral rinse  15 mL Mouth Rinse q12n4p  . chlorhexidine  15 mL Mouth Rinse BID  . DAPTOmycin (CUBICIN)   IV  500 mg Intravenous Q48H  . furosemide  60 mg Intravenous BID  . heparin  5,000 Units Subcutaneous Q8H  . insulin aspart  0-15 Units Subcutaneous TID WC  . insulin aspart  0-5 Units Subcutaneous QHS  . levothyroxine  60 mcg Intravenous Daily  . pantoprazole (PROTONIX) IV  40 mg Intravenous QHS    have reviewed scheduled and prn medications.  Physical Exam: General: morbidly obese, on bipap- talking loudly- maybe slightly confused Heart: RRR Lungs: decreased BS at bases Abdomen: obese, soft, non tender Extremities: pitting edema    10/15/2013,7:42 AM  LOS: 3 days

## 2013-10-15 NOTE — Progress Notes (Signed)
Patient ID: Amy Burns, female   DOB: 1940/10/24, 73 y.o.   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 On 2/8 developed worsening respiratory status and obtundation and PCCM consulted.   SIGNIFICANT EVENTS / STUDIES:  2/6 VQ scan >>>low prob  2/6 trop normal 2/6 CK normal 2/9 renal US >> no hyrdro; limited study d/t habitus  LINES / TUBES:  R PICC (pta)>>>   CULTURES:  BCx2 2/6>>> NGTD Urine 2/6>>>  NGTD  ANTIBIOTICS:  Daptomycin (pta)>>>  2/6 cefepime>> 2/7 2/6 vanc >> 2/6  SUBJECTIVE: More alert today per staff, pt is wanting BiPAP off. ABG without improvement.   VITAL SIGNS: Temp:  [97.9 F (36.6 C)-98.3 F (36.8 C)] 98.2 F (36.8 C) (02/09 0424) Pulse Rate:  [65-124] 66 (02/09 0600) Resp:  [9-24] 12 (02/09 0600) BP: (101-132)/(39-106) 104/44 mmHg (02/09 0600) SpO2:  [96 %-100 %] 99 % (02/09 0600) FiO2 (%):  [40 %] 40 % (02/08 1605) Weight:  [304 lb 7.3 oz (138.1 kg)] 304 lb 7.3 oz (138.1 kg) (02/09 0500) HEMODYNAMICS:   VENTILATOR SETTINGS: Vent Mode:  [-]  FiO2 (%):  [40 %] 40 % INTAKE / OUTPUT: Intake/Output     02/08 0701 - 02/09 0700   P.O. 150   I.V. (mL/kg) 240 (1.7)   Total Intake(mL/kg) 390 (2.8)   Urine (mL/kg/hr) 690 (0.2)   Total Output 690   Net -300         PHYSICAL EXAMINATION: General: Chronically ill appearing obese female, on bipap, yelling in room  Neuro: Mental status improved, awakens to voice, answers questions, follows commands. Oriented x3. Mild tremor in bilateral hands.  HEENT: Mm dry, bipap  Cardiovascular: s1s2 irreg  Lungs: resps even non  labored on bipap, diminished throughout  Abdomen: Obese, soft, +bs  Musculoskeletal: Warm and dry, 1-2+ BLE edema   LABS:  CBC  Recent Labs Lab 10/27/2013 1140 10/14/13 0500 10/15/13 0507  WBC 8.6 9.8 8.3  HGB 8.2* 7.3* 7.2*  HCT 27.2* 24.5* 23.9*  PLT 231 212 210   Coag's  Recent Labs Lab 10/13/2013 1140  APTT 33  INR 1.31   BMET  Recent Labs Lab 10/13/13 1850 10/14/13 0500 10/15/13 0507  NA 140 138 139  K 5.2 5.0 5.1  CL 104 101 103  CO2 24 23 23   BUN 70* 74* 76*  CREATININE 2.98* 3.22* 3.48*  GLUCOSE 160* 133* 138*   Electrolytes  Recent Labs Lab 10/13/13 1850 10/14/13 0500 10/15/13 0507  CALCIUM 8.0* 8.0* 7.9*  MG  --   --  2.3  PHOS  --   --  6.3*   Sepsis Markers No results found for this basename: LATICACIDVEN, PROCALCITON, O2SATVEN,  in the last 168 hours ABG  Recent Labs Lab 10/14/13 1609 10/14/13 2308 10/15/13 0400  PHART 7.240* 7.274* 7.246*  PCO2ART 58.2* 53.5* 55.5*  PO2ART 112.0* 109.0* 97.0   Liver Enzymes No results found for this basename: AST, ALT, ALKPHOS, BILITOT, ALBUMIN,  in the last 168 hours Cardiac Enzymes  Recent Labs Lab 11/01/2013 1140  PROBNP 1730.0*   Glucose  Recent Labs Lab 10/14/13 1211 10/14/13  1536 10/14/13 1934 10/14/13 2254 10/14/13 2354 10/15/13 0355  GLUCAP 151* 111* 119* 114* 114* 126*   Imaging US Renal  10/15/2013   CLINICAL DATA:  Thyroid for obstruction.  If acute renal injury.  EXAM: RENAL/URINARY TRACT ULTRASOUND COMPLETE  COMPARISON:  None.  FINDINGS: Right Kidney:  Length: 11.7 cm. Echogenicity within normal limits. No mass or hydronephrosis visualized.  Left Kidney:  Length: 11.6 cm. Echogenicity within normal limits. No mass or hydronephrosis visualized.  Bladder:  Bladder not visualized, likely decompressed with Foley catheter.  Study is technically suboptimal due to body habitus.  IMPRESSION: Technically suboptimal study.  No hydronephrosis.   Electronically Signed   By: Dereck Ligas M.D.   On: 10/15/2013 00:49   Dg Chest Port 1 View  10/14/2013   CLINICAL DATA:  Shortness of breath.  EXAM: PORTABLE CHEST - 1 VIEW  COMPARISON:  10/10/2013.  FINDINGS: Stable enlarged cardiac silhouette. The right PICC tip remains in the proximal superior vena cava. No significant change in diffuse bilateral airspace opacity. Unremarkable bones.  IMPRESSION: Stable diffuse alveolar edema or pneumonia and cardiomegaly.   Electronically Signed   By: Enrique Sack M.D.   On: 10/14/2013 13:45     CXR:    ASSESSMENT / PLAN:  PULMONARY  Acute hypercarbic respiratory failure (recent tx for HCAP, chronic DOE)  Pulmonary edema  OSA/OHS  Respiratory acidosis  P:  - Worsening respiratory failure, husband insured [place] that patient would just want comfort at this point. - Titrate o2 for comfort.  CARDIOVASCULAR  Likely volume overload - r/t renal fxn. Normal systolic fxn per echo in Jan  P:  - Comfort care, d/c meds and blood draws.  RENAL  Acute on chronic renal failure  Hyperkalemia - improved with kayexalate  P:  - No further blood draws.  Comfort care.  GASTROINTESTINAL  Obesity  P:  - Regular diet. - Comfort feeds.   HEMATOLOGIC  Anemia no obvious source bleeding  P:  - D/C further blood draws.  INFECTIOUS  MRSA septic R prosthetic knee  Recent HCAP - no evidence of this now  P:  - D/C abx.  ENDOCRINE  DM  Hypothyroidism  P:  - D/C CBGs.  NEUROLOGIC  AMS - multifactorial likely. Hypercarbia +/- uremia in setting renal failure.  P:  Morphine for comfort.  Howard Pouch, DO PGY-2  TODAY'S SUMMARY: Spoke with husband, he is clear that patient would just want to be comfortable at this point.  Will d/c drugs, start morphine and d/c BiPAP..   I have personally obtained a history, examined the patient, evaluated laboratory and imaging results, formulated the assessment and plan and placed orders.  CRITICAL CARE: The patient is critically ill with multiple organ  systems failure and requires high complexity decision making for assessment and support, frequent evaluation and titration of therapies, application of advanced monitoring technologies and extensive interpretation of multiple databases. Critical Care Time devoted to patient care services described in this note is 35 minutes.   Rush Farmer, M.D. [facility]. Pager: [phone]. After hours pager: [phone].

## 2013-10-17 NOTE — Progress Notes (Signed)
UR Completed.  Vergie Living [id number] 11/01/2013

## 2013-10-19 LAB — CULTURE, BLOOD (ROUTINE X 2)
CULTURE: NO GROWTH
Culture: NO GROWTH

## 2013-10-27 NOTE — Discharge Summary (Addendum)
NAMEMarland Kitchen  Amy Burns, Amy Burns NO.:  [id number]  MEDICAL RECORD NO.:  [id number]  LOCATION:  2M10C                        FACILITY:  [facility]  PHYSICIAN:  Providence Lanius, MD  DATE OF BIRTH:  [birth year]  DATE OF ADMISSION:  10/29/2013 DATE OF DISCHARGE:  10/24/2013                              DISCHARGE SUMMARY   DEATH SUMMARY  PRIMARY DIAGNOSIS/CAUSE OF DEATH:  Hypercarbic respiratory failure.  SECONDARY DIAGNOSES:  Septic shock, pulmonary edema, obstructive sleep apnea, respiratory acidosis, volume overload, hyperkalemia, acute-on- chronic renal failure, obesity, anemia, and MRSA, septic prosthesis, hypercarbia induced encephalopathy.  HOSPITAL COURSE:  The patient is a 73 year old female with past medical history significant for infection of right leg prosthesis, who developed septic shock, subsequent ARDS, and hypotension, was given IV fluid.  The patient developed pulmonary edema.  Upon evaluating the patient, the patient was clearly in respiratory failure, had a conversation with the patient's husband, who informed that the patient would not want to be intubated and placed on life support, at which point, decision was made to transition the patient to comfort care and morphine was started. BiPAP was discontinued and the patient expired comfortably with the family at bedside shortly thereafter.  Providence Lanius, MD     WJY/MEDQ  D:  10/26/2013  T:  10/27/2013  Job:  [id number]

## 2013-10-29 ENCOUNTER — Ambulatory Visit: Payer: Medicare Other | Admitting: Family Medicine

## 2013-11-01 ENCOUNTER — Ambulatory Visit: Payer: Medicare Other | Admitting: Internal Medicine

## 2013-11-04 NOTE — Progress Notes (Signed)
10/26/2013 0337 Pt asystole on monitor. Pt was DNR on comfort measures. 2 RNs [place] [facility] Hollace Kinnier) confirmed no pulse dopplerable, no heartbeat, lung sounds, no respirations, pupils fixed and dilated. Time of death: 3:37. Osvaldo Shipper and Aldona Lento wasted 60ccs of morphine gtt in sink. Family at bedside. Comfort and Support offered.

## 2013-11-04 DEATH — deceased

## 2014-09-19 ENCOUNTER — Encounter (HOSPITAL_COMMUNITY): Payer: Self-pay | Admitting: Orthopedic Surgery
